# Patient Record
Sex: Female | Born: 1955 | Race: White | Hispanic: No | Marital: Single | Smoking: Never smoker
Health system: Southern US, Community
[De-identification: ages and names within clinical notes are randomized; demographics above are authoritative.]

## PROBLEM LIST (undated history)

## (undated) DIAGNOSIS — K5792 Diverticulitis of intestine, part unspecified, without perforation or abscess without bleeding: Secondary | ICD-10-CM

---

## 1999-08-17 ENCOUNTER — Encounter: Admission: RE | Admit: 1999-08-17 | Discharge: 1999-08-17 | Payer: Self-pay | Admitting: Gynecology

## 1999-08-17 ENCOUNTER — Encounter: Payer: Self-pay | Admitting: Gynecology

## 2000-08-29 ENCOUNTER — Encounter: Admission: RE | Admit: 2000-08-29 | Discharge: 2000-08-29 | Payer: Self-pay | Admitting: Gynecology

## 2000-08-29 ENCOUNTER — Other Ambulatory Visit: Admission: RE | Admit: 2000-08-29 | Discharge: 2000-08-29 | Payer: Self-pay | Admitting: Gynecology

## 2000-08-29 ENCOUNTER — Encounter: Payer: Self-pay | Admitting: Gynecology

## 2001-09-18 ENCOUNTER — Other Ambulatory Visit: Admission: RE | Admit: 2001-09-18 | Discharge: 2001-09-18 | Payer: Self-pay | Admitting: Gynecology

## 2001-09-18 ENCOUNTER — Encounter: Admission: RE | Admit: 2001-09-18 | Discharge: 2001-09-18 | Payer: Self-pay | Admitting: Gynecology

## 2001-09-18 ENCOUNTER — Encounter: Payer: Self-pay | Admitting: Gynecology

## 2002-11-14 ENCOUNTER — Encounter: Payer: Self-pay | Admitting: Gynecology

## 2002-11-14 ENCOUNTER — Encounter: Admission: RE | Admit: 2002-11-14 | Discharge: 2002-11-14 | Payer: Self-pay | Admitting: Gynecology

## 2002-11-14 ENCOUNTER — Other Ambulatory Visit: Admission: RE | Admit: 2002-11-14 | Discharge: 2002-11-14 | Payer: Self-pay | Admitting: Gynecology

## 2003-12-04 ENCOUNTER — Encounter: Admission: RE | Admit: 2003-12-04 | Discharge: 2003-12-04 | Payer: Self-pay | Admitting: Gynecology

## 2003-12-04 ENCOUNTER — Other Ambulatory Visit: Admission: RE | Admit: 2003-12-04 | Discharge: 2003-12-04 | Payer: Self-pay | Admitting: Gynecology

## 2004-12-23 ENCOUNTER — Ambulatory Visit (HOSPITAL_COMMUNITY): Admission: RE | Admit: 2004-12-23 | Discharge: 2004-12-23 | Payer: Self-pay | Admitting: Gynecology

## 2005-01-01 ENCOUNTER — Other Ambulatory Visit: Admission: RE | Admit: 2005-01-01 | Discharge: 2005-01-01 | Payer: Self-pay | Admitting: Gynecology

## 2006-01-12 ENCOUNTER — Ambulatory Visit (HOSPITAL_COMMUNITY): Admission: RE | Admit: 2006-01-12 | Discharge: 2006-01-12 | Payer: Self-pay | Admitting: Gynecology

## 2007-03-08 ENCOUNTER — Ambulatory Visit (HOSPITAL_COMMUNITY): Admission: RE | Admit: 2007-03-08 | Discharge: 2007-03-08 | Payer: Self-pay | Admitting: Obstetrics and Gynecology

## 2008-04-16 ENCOUNTER — Ambulatory Visit (HOSPITAL_COMMUNITY): Admission: RE | Admit: 2008-04-16 | Discharge: 2008-04-16 | Payer: Self-pay | Admitting: Obstetrics and Gynecology

## 2009-04-30 ENCOUNTER — Ambulatory Visit (HOSPITAL_COMMUNITY): Admission: RE | Admit: 2009-04-30 | Discharge: 2009-04-30 | Payer: Self-pay | Admitting: Obstetrics and Gynecology

## 2010-05-25 ENCOUNTER — Ambulatory Visit (HOSPITAL_COMMUNITY): Admission: RE | Admit: 2010-05-25 | Discharge: 2010-05-25 | Payer: Self-pay | Admitting: Obstetrics and Gynecology

## 2010-11-08 ENCOUNTER — Encounter: Payer: Self-pay | Admitting: Obstetrics & Gynecology

## 2010-11-08 ENCOUNTER — Encounter: Payer: Self-pay | Admitting: Obstetrics and Gynecology

## 2011-06-24 ENCOUNTER — Other Ambulatory Visit: Payer: Self-pay | Admitting: Obstetrics and Gynecology

## 2011-06-24 DIAGNOSIS — Z1231 Encounter for screening mammogram for malignant neoplasm of breast: Secondary | ICD-10-CM

## 2011-07-23 ENCOUNTER — Ambulatory Visit
Admission: RE | Admit: 2011-07-23 | Discharge: 2011-07-23 | Disposition: A | Discharge status: Home / Self Care | Payer: BC Managed Care – PPO | Source: Ambulatory Visit | Attending: Obstetrics and Gynecology | Admitting: Obstetrics and Gynecology

## 2011-07-23 DIAGNOSIS — Z1231 Encounter for screening mammogram for malignant neoplasm of breast: Secondary | ICD-10-CM

## 2012-08-31 ENCOUNTER — Other Ambulatory Visit: Payer: Self-pay | Admitting: Obstetrics and Gynecology

## 2012-08-31 DIAGNOSIS — Z1231 Encounter for screening mammogram for malignant neoplasm of breast: Secondary | ICD-10-CM

## 2012-10-12 ENCOUNTER — Ambulatory Visit
Admission: RE | Admit: 2012-10-12 | Discharge: 2012-10-12 | Disposition: A | Discharge status: Home / Self Care | Payer: No Typology Code available for payment source | Source: Ambulatory Visit | Attending: Obstetrics and Gynecology | Admitting: Obstetrics and Gynecology

## 2012-10-12 DIAGNOSIS — Z1231 Encounter for screening mammogram for malignant neoplasm of breast: Secondary | ICD-10-CM

## 2013-09-07 ENCOUNTER — Other Ambulatory Visit: Payer: Self-pay

## 2013-09-07 DIAGNOSIS — Z1231 Encounter for screening mammogram for malignant neoplasm of breast: Secondary | ICD-10-CM

## 2013-10-16 ENCOUNTER — Ambulatory Visit
Admission: RE | Admit: 2013-10-16 | Discharge: 2013-10-16 | Disposition: A | Discharge status: Home / Self Care | Payer: No Typology Code available for payment source | Source: Ambulatory Visit

## 2013-10-16 DIAGNOSIS — Z1231 Encounter for screening mammogram for malignant neoplasm of breast: Secondary | ICD-10-CM

## 2014-06-18 DEATH — deceased

## 2014-10-02 ENCOUNTER — Other Ambulatory Visit: Payer: Self-pay

## 2014-10-02 DIAGNOSIS — Z1231 Encounter for screening mammogram for malignant neoplasm of breast: Secondary | ICD-10-CM

## 2014-10-17 ENCOUNTER — Ambulatory Visit
Admission: RE | Admit: 2014-10-17 | Discharge: 2014-10-17 | Disposition: A | Discharge status: Home / Self Care | Payer: No Typology Code available for payment source | Source: Ambulatory Visit

## 2014-10-17 DIAGNOSIS — Z1231 Encounter for screening mammogram for malignant neoplasm of breast: Secondary | ICD-10-CM

## 2015-11-20 ENCOUNTER — Emergency Department (HOSPITAL_COMMUNITY)
Admission: EM | Admit: 2015-11-20 | Discharge: 2015-11-21 | Disposition: A | Discharge status: Other Acute Inpatient Hospital | Payer: BLUE CROSS/BLUE SHIELD | Attending: Emergency Medicine | Admitting: Emergency Medicine

## 2015-11-20 ENCOUNTER — Encounter (HOSPITAL_COMMUNITY): Payer: Self-pay | Admitting: Emergency Medicine

## 2015-11-20 DIAGNOSIS — Z79899 Other long term (current) drug therapy: Secondary | ICD-10-CM | POA: Insufficient documentation

## 2015-11-20 DIAGNOSIS — F309 Manic episode, unspecified: Secondary | ICD-10-CM

## 2015-11-20 DIAGNOSIS — F302 Manic episode, severe with psychotic symptoms: Secondary | ICD-10-CM | POA: Diagnosis not present

## 2015-11-20 DIAGNOSIS — R4182 Altered mental status, unspecified: Secondary | ICD-10-CM | POA: Diagnosis present

## 2015-11-20 DIAGNOSIS — R451 Restlessness and agitation: Secondary | ICD-10-CM | POA: Insufficient documentation

## 2015-11-20 LAB — CBC
HEMATOCRIT: 40.6 % (ref 36.0–46.0)
HEMOGLOBIN: 14.1 g/dL (ref 12.0–15.0)
MCH: 31.1 pg (ref 26.0–34.0)
MCHC: 34.7 g/dL (ref 30.0–36.0)
MCV: 89.6 fL (ref 78.0–100.0)
Platelets: 305 10*3/uL (ref 150–400)
RBC: 4.53 MIL/uL (ref 3.87–5.11)
RDW: 12.4 % (ref 11.5–15.5)
WBC: 10.5 10*3/uL (ref 4.0–10.5)

## 2015-11-20 LAB — RAPID URINE DRUG SCREEN, HOSP PERFORMED
AMPHETAMINES: NOT DETECTED
BARBITURATES: NOT DETECTED
Benzodiazepines: NOT DETECTED
Cocaine: NOT DETECTED
OPIATES: NOT DETECTED
TETRAHYDROCANNABINOL: NOT DETECTED

## 2015-11-20 LAB — URINALYSIS, ROUTINE W REFLEX MICROSCOPIC
BILIRUBIN URINE: NEGATIVE
GLUCOSE, UA: NEGATIVE mg/dL
Ketones, ur: NEGATIVE mg/dL
Leukocytes, UA: NEGATIVE
Nitrite: NEGATIVE
PH: 5.5 (ref 5.0–8.0)
Protein, ur: 30 mg/dL — AB
SPECIFIC GRAVITY, URINE: 1.015 (ref 1.005–1.030)

## 2015-11-20 LAB — COMPREHENSIVE METABOLIC PANEL
ALBUMIN: 4.6 g/dL (ref 3.5–5.0)
ALK PHOS: 76 U/L (ref 38–126)
ALT: 30 U/L (ref 14–54)
ANION GAP: 15 (ref 5–15)
AST: 44 U/L — ABNORMAL HIGH (ref 15–41)
BUN: 22 mg/dL — ABNORMAL HIGH (ref 6–20)
CHLORIDE: 98 mmol/L — AB (ref 101–111)
CO2: 19 mmol/L — AB (ref 22–32)
Calcium: 9.1 mg/dL (ref 8.9–10.3)
Creatinine, Ser: 0.85 mg/dL (ref 0.44–1.00)
GFR calc non Af Amer: >60 mL/min (ref >60)
GLUCOSE: 104 mg/dL — AB (ref 65–99)
POTASSIUM: 4 mmol/L (ref 3.5–5.1)
SODIUM: 132 mmol/L — AB (ref 135–145)
Total Bilirubin: 0.7 mg/dL (ref 0.3–1.2)
Total Protein: 7.4 g/dL (ref 6.5–8.1)

## 2015-11-20 LAB — URINE MICROSCOPIC-ADD ON

## 2015-11-20 LAB — ETHANOL: Alcohol, Ethyl (B): 159 mg/dL — ABNORMAL HIGH (ref <5)

## 2015-11-20 MED ORDER — ALUM & MAG HYDROXIDE-SIMETH 200-200-20 MG/5ML PO SUSP: 30.0000 mL | ORAL | Status: DC | PRN

## 2015-11-20 MED ORDER — IBUPROFEN 200 MG PO TABS
600.0000 mg | ORAL_TABLET | Freq: Three times a day (TID) | ORAL | Status: DC | PRN
  Filled 2015-11-20: qty 3

## 2015-11-20 MED ORDER — LORAZEPAM 1 MG PO TABS
1.0000 mg | ORAL_TABLET | Freq: Three times a day (TID) | ORAL | Status: DC | PRN
  Administered 2015-11-21 (×2): 1 mg via ORAL
  Filled 2015-11-20 (×2): qty 1

## 2015-11-20 MED ORDER — NICOTINE 21 MG/24HR TD PT24: 21.0000 mg | MEDICATED_PATCH | Freq: Every day | TRANSDERMAL | Status: DC

## 2015-11-20 MED ORDER — ACETAMINOPHEN 325 MG PO TABS
650.0000 mg | ORAL_TABLET | ORAL | Status: DC | PRN
  Administered 2015-11-21: 650 mg via ORAL
  Filled 2015-11-20: qty 2

## 2015-11-20 MED ORDER — ZOLPIDEM TARTRATE 5 MG PO TABS: 5.0000 mg | ORAL_TABLET | Freq: Every evening | ORAL | Status: DC | PRN

## 2015-11-20 MED ORDER — ONDANSETRON HCL 4 MG PO TABS: 4.0000 mg | ORAL_TABLET | Freq: Three times a day (TID) | ORAL | Status: DC | PRN

## 2015-11-20 NOTE — ED Notes (Signed)
ETOH was already added Tech sent extra blood and printout

## 2015-11-20 NOTE — ED Notes (Signed)
Patient is mentally altered. Patient is upset that trump is president. She says the world is not content. Patient states that she has had some wine.

## 2015-11-20 NOTE — ED Provider Notes (Signed)
CSN: [id number]     Arrival date & time 11/20/15  2009 History   First MD Initiated Contact with Patient 11/20/15 2021     Chief Complaint  Patient presents with  . Altered Mental Status     (Consider location/radiation/quality/duration/timing/severity/associated sxs/prior Treatment) HPI   Blood pressure 150/102, pulse 56, temperature 98.3 F (36.8 C), temperature source Oral, resp. rate 18, height 5\' 2"  (1.575 m), weight 56.7 kg, SpO2 99 %.  Michelle Levy is a 60 y.o. female with no significant past medical history accompanied by her daughter who prompted her to come to the emergency room for erratic behavior. As per daughter no psychiatric diagnoses however bipolar runs in the family. He is not taking any medications. Patient is becoming increasingly more agitated, labile, erratic behavior with no physical outbursts. Patient denies suicidal ideation, homicidal ideation, drug use. She states that she drinks wine occasionally. As per daughter she drinks daily, just a glass or 2 no history of seizures, hallucinations from not drinking alcohol. Level V caveat secondary to acute psychiatric condition. As per daughter, she is hearing voices.  History reviewed. No pertinent past medical history. History reviewed. No pertinent past surgical history. No family history on file. Social History  Substance Use Topics  . Smoking status: Never Smoker   . Smokeless tobacco: Never Used  . Alcohol Use: Yes   OB History    No data available     Review of Systems  10 systems reviewed and found to be negative, except as noted in the HPI.   Allergies  Review of patient's allergies indicates no known allergies.  Home Medications   Prior to Admission medications   Medication Sig Start Date End Date Taking? Authorizing Provider  Ascorbic Acid (VITAMIN C PO) Take 1 tablet by mouth daily.   Yes Historical Provider, MD  CALCIUM PO Take 1 tablet by mouth daily.   Yes Historical Provider, MD   Cholecalciferol (VITAMIN D PO) Take 1 tablet by mouth daily.   Yes Historical Provider, MD  Cyanocobalamin (VITAMIN B 12 PO) Take 1 tablet by mouth daily.   Yes Historical Provider, MD  MAGNESIUM PO Take 1 tablet by mouth daily.   Yes Historical Provider, MD  Multiple Vitamins-Minerals (MULTIVITAMIN & MINERAL PO) Take 1 tablet by mouth daily.   Yes Historical Provider, MD   BP 150/102 mmHg  Pulse 56  Temp(Src) 98.3 F (36.8 C) (Oral)  Resp 18  Ht 5\' 2"  (1.575 m)  Wt 56.7 kg  BMI 22.86 kg/m2  SpO2 99% Physical Exam  Constitutional: She is oriented to person, place, and time. She appears well-developed and well-nourished. No distress.  HENT:  Head: Normocephalic.  Eyes: Conjunctivae and EOM are normal.  Cardiovascular: Normal rate.   Pulmonary/Chest: Effort normal. No stridor.  Musculoskeletal: Normal range of motion.  Neurological: She is alert and oriented to person, place, and time.  Psychiatric: Her affect is labile. Her speech is rapid and/or pressured and tangential. She is agitated. Thought content is paranoid. She expresses no homicidal and no suicidal ideation.  Patient preseverating on recent gender of the people around her. She is redirectable and can be cooperative. Extremely agitated, manic. ++ Flight of ideas  Nursing note and vitals reviewed.   ED Course  Procedures (including critical care time) Labs Review Labs Reviewed  COMPREHENSIVE METABOLIC PANEL - Abnormal; Notable for the following:    Sodium 132 (*)    Chloride 98 (*)    CO2 19 (*)  Glucose, Bld 104 (*)    BUN 22 (*)    AST 44 (*)    All other components within normal limits  URINALYSIS, ROUTINE W REFLEX MICROSCOPIC (NOT AT [facility]) - Abnormal; Notable for the following:    Hgb urine dipstick SMALL (*)    Protein, ur 30 (*)    All other components within normal limits  ETHANOL - Abnormal; Notable for the following:    Alcohol, Ethyl (B) 159 (*)    All other components within normal limits  URINE  MICROSCOPIC-ADD ON - Abnormal; Notable for the following:    Squamous Epithelial / LPF 0-5 (*)    Bacteria, UA RARE (*)    All other components within normal limits  CBC  URINE RAPID DRUG SCREEN, HOSP PERFORMED    Imaging Review No results found. I have personally reviewed and evaluated these images and lab results as part of my medical decision-making.   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 female presenting acutely manic, flight of ideas. Disorganized thoughts. Patient is here voluntarily, she has no prior psychiatric diagnoses. Patient wants to leave she may need to be involuntarily committed.  Patient is medically cleared for psychiatric evaluation will be transferred to the psych ED. TTS consulted, home meds and psych standard holding orders placed.        Monico Blitz, PA-C 11/20/15 2249  Carmin Muskrat, MD 11/20/15 [phone]

## 2015-11-21 ENCOUNTER — Inpatient Hospital Stay (HOSPITAL_COMMUNITY)
Admission: EM | Admit: 2015-11-21 | Discharge: 2015-11-27 | DRG: 885 | Disposition: A | Discharge status: Home / Self Care | Payer: BLUE CROSS/BLUE SHIELD | Source: Intra-hospital | Attending: Psychiatry | Admitting: Psychiatry

## 2015-11-21 ENCOUNTER — Encounter (HOSPITAL_COMMUNITY): Payer: Self-pay | Admitting: *Deleted

## 2015-11-21 DIAGNOSIS — F319 Bipolar disorder, unspecified: Secondary | ICD-10-CM | POA: Diagnosis not present

## 2015-11-21 DIAGNOSIS — R462 Strange and inexplicable behavior: Secondary | ICD-10-CM

## 2015-11-21 DIAGNOSIS — G4700 Insomnia, unspecified: Secondary | ICD-10-CM | POA: Diagnosis present

## 2015-11-21 DIAGNOSIS — F3163 Bipolar disorder, current episode mixed, severe, without psychotic features: Secondary | ICD-10-CM | POA: Clinically undetermined

## 2015-11-21 DIAGNOSIS — F29 Unspecified psychosis not due to a substance or known physiological condition: Secondary | ICD-10-CM | POA: Diagnosis present

## 2015-11-21 DIAGNOSIS — F1020 Alcohol dependence, uncomplicated: Secondary | ICD-10-CM | POA: Diagnosis present

## 2015-11-21 DIAGNOSIS — F302 Manic episode, severe with psychotic symptoms: Secondary | ICD-10-CM | POA: Diagnosis not present

## 2015-11-21 DIAGNOSIS — F419 Anxiety disorder, unspecified: Secondary | ICD-10-CM | POA: Diagnosis present

## 2015-11-21 DIAGNOSIS — Z634 Disappearance and death of family member: Secondary | ICD-10-CM | POA: Diagnosis not present

## 2015-11-21 MED ORDER — HYDROXYZINE HCL 25 MG PO TABS
25.0000 mg | ORAL_TABLET | Freq: Four times a day (QID) | ORAL | Status: AC | PRN
  Administered 2015-11-22: 25 mg via ORAL
  Filled 2015-11-21: qty 1

## 2015-11-21 MED ORDER — CHLORDIAZEPOXIDE HCL 25 MG PO CAPS: 25.0000 mg | ORAL_CAPSULE | Freq: Four times a day (QID) | ORAL | Status: AC | PRN

## 2015-11-21 MED ORDER — MAGNESIUM HYDROXIDE 400 MG/5ML PO SUSP: 30.0000 mL | Freq: Every day | ORAL | Status: DC | PRN

## 2015-11-21 MED ORDER — QUETIAPINE FUMARATE 25 MG PO TABS
25.0000 mg | ORAL_TABLET | Freq: Every day | ORAL | Status: DC
  Administered 2015-11-22: 25 mg via ORAL
  Filled 2015-11-21 (×3): qty 1

## 2015-11-21 MED ORDER — ACETAMINOPHEN 325 MG PO TABS: 650.0000 mg | ORAL_TABLET | Freq: Four times a day (QID) | ORAL | Status: DC | PRN

## 2015-11-21 MED ORDER — ONDANSETRON 4 MG PO TBDP: 4.0000 mg | ORAL_TABLET | Freq: Four times a day (QID) | ORAL | Status: AC | PRN

## 2015-11-21 MED ORDER — ALUM & MAG HYDROXIDE-SIMETH 200-200-20 MG/5ML PO SUSP: 30.0000 mL | ORAL | Status: DC | PRN

## 2015-11-21 MED ORDER — LOPERAMIDE HCL 2 MG PO CAPS: 2.0000 mg | ORAL_CAPSULE | ORAL | Status: AC | PRN

## 2015-11-21 MED ORDER — ADULT MULTIVITAMIN W/MINERALS CH
1.0000 | ORAL_TABLET | Freq: Every day | ORAL | Status: DC
  Administered 2015-11-21 – 2015-11-27 (×6): 1 via ORAL
  Filled 2015-11-21 (×11): qty 1

## 2015-11-21 MED ORDER — THIAMINE HCL 100 MG/ML IJ SOLN
100.0000 mg | Freq: Once | INTRAMUSCULAR | Status: AC
  Administered 2015-11-21: 100 mg via INTRAMUSCULAR
  Filled 2015-11-21: qty 2

## 2015-11-21 MED ORDER — VITAMIN B-1 100 MG PO TABS
100.0000 mg | ORAL_TABLET | Freq: Every day | ORAL | Status: DC
  Administered 2015-11-23 – 2015-11-27 (×5): 100 mg via ORAL
  Filled 2015-11-21 (×8): qty 1

## 2015-11-21 NOTE — BH Assessment (Addendum)
Tele Assessment Note   Michelle Levy is a Caucasian, divorced 60 y.o. female presenting to [facility] due to worsening psychotic behaviors and mania, including rambling with flight of ideas, hallucinations, and bizarre, erratic behavior. Pt is reportedly exhibiting increased agitation, preoccupation with political matters, and paranoia (that others are agitated with her). Pt's daughter, [name], prompted her to come to the ED for evaluation. Pt's daughter says that her mother has been "off" since yesterday, with disorganized thoughts, not remembering bizarre things she's said and done, rambling on about President Trump, unintentionally running red lights, and even walking up to a stranger in a restaurant and asking him to order for her. Today, the pt went to ger grandson's school and began running, yelling, and throwing her hands in the air attempting to play with the children and make them laugh. The pt said she was just feeling "joyful" and does not see this as inappropriate behavior, adding that her family doesn't want her to be "joyful". The pt acknowledged that she had been sitting on her porch earlier in the day screaming and crying "to try and get my emotions out". She also informed her daughter that she was hearing voices in the form of someone talking to her. She reports that she has had 2 family deaths in the month of Jan (2017) alone, and she becomes very tearful while speaking about this; the pt believes that these deaths are contributing to [facility] her family calls] her "concerning behaviors". Family reports that pt exhibits these erratic behaviors even when not drinking or intoxicated, however.  The pt admits that she has been drinking wine today, estimated to be about "3 glasses of wine"; she minimized her etoh use but the daughter states that the pt has been abusing etoh for many years. The daughter adds that her mother used to drink 2-3 bottles of wine per night but has decreased to 1/2 to 1 bottle per  evening. Pt's BAL upon arrival to ED is 159. No other illicit drug use. Pt reports restless sleep, irritability, sadness, tearfulness, fatigue, excessive worry, and agitation. There appears to be some paranoia and personalization, as the pt c/o "other people being agitated with me", whether it's people ranting on face book, people whispering in public, strangers cutting her off in traffic, etc. She says she sleeps "with one eye open".  Pt states that she is not under the care of a mental health professional and takes no psychiatric medications. She states that she used to see a counselor with her ex-husband many years ago for marriage counseling though. She denies any hx of psychiatric inpt hospitalization but says that her mother had a "breakdown" after the birth of a child and was in a psychiatric facility for 3 months. Pt denies SI/HI but admits to suicidal thoughts in 2008 during the economic recession because she and her husband lost a lot of money. She denies any prior suicide attempts.  Pt presents with depressed, labile mood and congruent affect. She is irritable at times and pleasant, crying, and suspicious at other times. She is hyper verbal and speech is rapid. She gets agitated just speaking about certain topics, like politics. She is rather defensive during the assessment, asking the counselor "Do you think I'm of sound mind?" or "Aren't you bothered by what's going on in this country?". She exhibits poor concentration, good eye contact, and appears restless. Thought process is tangential with flight of ideas. Pt does not appear to be responding to internal stimuli at present.  She is experiencing psychosis, but she has no hx of violent behavior whatsoever (as verified by family). Pt is oriented to person and place only. Judgment is impaired and insight is very poor.   Disposition: Per Arlester Marker, NP, Pt meets inpt tx criteria. Per Felix Pacini, Pt accepted to [facility] bed 505-2 under the care of Dr  Shea Evans. Pt to come after 08:00 AM on 11/21/15.  Diagnosis:  Bipolar I Disorder, Manic, With psychotic features;  R/O Schizoaffective, Bipolar Type Alcohol use disorder, Moderate  Past Medical History: History reviewed. No pertinent past medical history.  History reviewed. No pertinent past surgical history.  Family History: No family history on file.  Social History:  reports that she has never smoked. She has never used smokeless tobacco. She reports that she drinks alcohol. She reports that she does not use illicit drugs.  Additional Social History:  Alcohol / Drug Use Pain Medications: See PTA med list Prescriptions: See PTA med list Over the Counter: See PTA med list History of alcohol / drug use?: Yes Longest period of sobriety (when/how long): Unknown Negative Consequences of Use: Personal relationships Withdrawal Symptoms:  (Pt denies) Substance #1 Name of Substance 1: Etoh (Wine) 1 - Age of First Use: 20's 1 - Amount (size/oz): 2-3 bottles of wine in past; has cut down to 1 bottle 1 - Frequency: Almost daily 1 - Duration: 10+ years 1 - Last Use / Amount: 11/20/15, "3 glasses of wine"  CIWA: CIWA-Ar BP: 129/73 mmHg Pulse Rate: 95 Nausea and Vomiting: no nausea and no vomiting Tactile Disturbances: none Tremor: no tremor Auditory Disturbances: not present Paroxysmal Sweats: no sweat visible Visual Disturbances: not present Anxiety: no anxiety, at ease Headache, Fullness in Head: none present Agitation: normal activity Orientation and Clouding of Sensorium: oriented and can do serial additions CIWA-Ar Total: 0 COWS:    PATIENT STRENGTHS: (choose at least two) Average or above average intelligence Physical Health Supportive family/friends Work skills  Allergies: No Known Allergies  Home Medications:  (Not in a hospital admission)  OB/GYN Status:  No LMP recorded. Patient is postmenopausal.  General Assessment Data Location of Assessment: [facility] ED TTS  Assessment: In system Is this a Tele or Face-to-Face Assessment?: Tele Assessment Is this an Initial Assessment or a Re-assessment for this encounter?: Initial Assessment Marital status: Divorced [place] name: unknown Is patient pregnant?: No Pregnancy Status: No Living Arrangements: Alone Can pt return to current living arrangement?: Yes Admission Status: Voluntary Is patient capable of signing voluntary admission?: Yes Referral Source: Self/Family/Friend (Pt's daughter, [name]) Insurance type: [employer]     Crisis Care Plan Living Arrangements: Alone Legal Guardian:  (Daughter thinks she may be POA (no documentation of this)) Name of Psychiatrist: None Name of Therapist: None  Education Status Is patient currently in school?: No Current Grade: na Highest grade of school patient has completed: na Name of school: na Contact person: na  Risk to self with the past 6 months Suicidal Ideation: No Has patient been a risk to self within the past 6 months prior to admission? : No Suicidal Intent: No Has patient had any suicidal intent within the past 6 months prior to admission? : No Is patient at risk for suicide?: No Suicidal Plan?: No Has patient had any suicidal plan within the past 6 months prior to admission? : No Access to Means: No What has been your use of drugs/alcohol within the last 12 months?: Etoh use multiple times per week Previous Attempts/Gestures: No How many times?: 0  Other Self Harm Risks: Etoh abuse Triggers for Past Attempts: None known Intentional Self Injurious Behavior: None Family Suicide History: No Recent stressful life event(s): Conflict (Comment), Loss (Comment), Financial Problems (Family conflict, 2 family deaths in Jan, political stressors) Persecutory voices/beliefs?: Yes Depression: Yes Depression Symptoms: Tearfulness, Isolating, Feeling angry/irritable (restless sleep, personalization) Substance abuse history and/or treatment for substance  abuse?: Yes Suicide prevention information given to non-admitted patients: Not applicable  Risk to Others within the past 6 months Homicidal Ideation: No Does patient have any lifetime risk of violence toward others beyond the six months prior to admission? : No Thoughts of Harm to Others: No Current Homicidal Intent: No Current Homicidal Plan: No Access to Homicidal Means: No Identified Victim: n/a History of harm to others?: No Assessment of Violence: None Noted Violent Behavior Description: No hx of violence; just verbal aggression/screaming/yelling Does patient have access to weapons?: No Criminal Charges Pending?: No Does patient have a court date: No Is patient on probation?: No  Psychosis Hallucinations: Auditory Delusions: Unspecified (Paranoid ideations ("ppl agitated with me, talk about me"))  Mental Status Report Appearance/Hygiene: In scrubs Eye Contact: Good Motor Activity: Restlessness Speech: Rapid Level of Consciousness: Irritable Mood: Depressed, Suspicious Affect: Labile Anxiety Level: Moderate Thought Processes: Tangential, Flight of Ideas Judgement: Impaired Orientation: Person, Place Obsessive Compulsive Thoughts/Behaviors: Moderate  Cognitive Functioning Concentration: Decreased Memory: Recent Impaired IQ: Average Insight: Poor Impulse Control: Poor Appetite: Good Weight Loss: 0 Weight Gain: 0 Sleep: No Change Total Hours of Sleep: 7 ("restless sleep") Vegetative Symptoms: None  ADLScreening [facility] Assessment Services) Patient's cognitive ability adequate to safely complete daily activities?: Yes Patient able to express need for assistance with ADLs?: Yes Independently performs ADLs?: Yes (appropriate for developmental age)  Prior Inpatient Therapy Prior Inpatient Therapy: No Prior Therapy Dates: na Prior Therapy Facilty/Provider(s): na Reason for Treatment: na  Prior Outpatient Therapy Prior Outpatient Therapy: Yes Prior Therapy  Dates: "a few years ago" with my ex-husband Prior Therapy Facilty/Provider(s): Unknown Reason for Treatment: Marital counseling Does patient have an ACCT team?: No Does patient have Intensive In-House Services?  : No Does patient have [facility] services? : No Does patient have P4CC services?: No  ADL Screening (condition at time of admission) Patient's cognitive ability adequate to safely complete daily activities?: Yes Is the patient deaf or have difficulty hearing?: No Does the patient have difficulty seeing, even when wearing glasses/contacts?: No Does the patient have difficulty concentrating, remembering, or making decisions?: Yes Patient able to express need for assistance with ADLs?: Yes Does the patient have difficulty dressing or bathing?: No Independently performs ADLs?: Yes (appropriate for developmental age) Does the patient have difficulty walking or climbing stairs?: No Weakness of Legs: None Weakness of Arms/Hands: None  Home Assistive Devices/Equipment Home Assistive Devices/Equipment: None    Abuse/Neglect Assessment (Assessment to be complete while patient is alone) Physical Abuse: Denies Verbal Abuse: Denies Sexual Abuse: Denies Exploitation of patient/patient's resources: Denies Self-Neglect: Denies Values / Beliefs Cultural Requests During Hospitalization: None Spiritual Requests During Hospitalization: None   Advance Directives (For Healthcare) Does patient have an advance directive?: No Would patient like information on creating an advanced directive?: No - patient declined information    Additional Information 1:1 In Past 12 Months?: No CIRT Risk: No Elopement Risk: No Does patient have medical clearance?: Yes     Disposition: Per Arlester Marker, NP, Pt meets inpt tx criteria. Per Felix Pacini, Pt accepted to [facility] bed 505-2 under the care of Dr Shea Evans. Pt to come after  08:00 AM on 11/21/15. Disposition Initial Assessment Completed for this  Encounter: Yes Disposition of Patient: Inpatient treatment program Type of inpatient treatment program: Adult  Ramond Dial, [facility]  11/21/2015 1:34 AM

## 2015-11-21 NOTE — ED Notes (Signed)
Error in charting: Wrong emtala entry at 0912. Wrong pt for 0912 note.

## 2015-11-21 NOTE — ED Notes (Signed)
This is a 60 years old caucasian female admitted to the unit for psychosis and altered mental status. She appeared manic, hyper verbal,had disorganized thought process and tangential. She reported that her ex- alcoholic husband gave her 2 boxes of wine for christmas and she has been drinking since then. She said she had 3 glasses of wine today. She said she is overwhelmed by what is going on in the country, " why can't everybody love everybody? Why can't we all be free to make decisions for ourselves. She reported that she was in [place] last month for the women's match. Her thoughts process is disorganized and is labile. She often stopped and cry during the assessment. Writer offered patient medication. She refused ,stating, "that is a way you control [place], I won't allow anyone to control me" . Writer offered patient beverage and encouraged her to get some rest.

## 2015-11-21 NOTE — ED Provider Notes (Signed)
Patient placed at [facility] and accepted by Dr. Shea Evans Patient evaluated. Stable vital signs and unremarkable physical exam.  Medically stable for transfer to [facility] for ongoing pysch treatment.  Forde Dandy, MD 11/21/15 [phone]

## 2015-11-21 NOTE — Progress Notes (Signed)
Adult Psychoeducational Group Note  Date:  11/21/2015 Time:  8:55 PM  Group Topic/Focus:  Wrap-Up Group:   The focus of this group is to help patients review their daily goal of treatment and discuss progress on daily workbooks.  Participation Level:  Active  Participation Quality:  Appropriate  Affect:  Appropriate  Cognitive:  Appropriate  Insight: Appropriate  Engagement in Group:  Engaged  Modes of Intervention:  Discussion  Additional Comments:  The patient expressed that she attended group.The patient also said that he rates his day a 4.  Nash Shearer 11/21/2015, 8:55 PM

## 2015-11-21 NOTE — Progress Notes (Signed)
D: Pt is alert and oriented x 4; denies depression, anxiety, pain, SI, HI and AVH; she states, "I was a little confused this evening but I feel very good right now." Pt is isolative and withdrawn to room. Pt remained respectful, calm and cooperative through the shift assessment.  A: Medications offered as prescribed.  Support, encouragement, and safe environment provided.  15-minute safety checks continue.  R: Pt was med compliant.  Pt attended group. Safety checks continue

## 2015-11-21 NOTE — Progress Notes (Signed)
Patient remains confused and delusional. She was in bed actively hallucination, thought process disorganized and tangential. She complaint of having headache  And she seemed restless. Tylenol and Ativan given. Patient received those medication at this time. Q 15 minutes check continues for safety.

## 2015-11-21 NOTE — H&P (Signed)
Psychiatric Admission Assessment Adult  Patient Identification: Michelle Levy MRN:  [id number] Date of Evaluation:  11/21/2015 Chief Complaint:  Bipolar Disorder Principal Diagnosis: Bizarre behavior Diagnosis:   Patient Active Problem List   Diagnosis Date Noted  . Bizarre behavior [R46.2] 11/21/2015    Priority: High  . Psychosis [F29] 11/21/2015   History of Present Illness:   Michelle Levy, 60 y.o. female initially presented to [facility] accompanied by her family after she exhibited worsening psychotic behaviors and mania, including rambling with flight of ideas, hallucinations, and bizarre, erratic behavior.  Per chart records, the patient was agitated at home and was preoccupied with president Trump and going to grandson's school throwing her hands in the air.  Per her daughter, patient had stated that she had been hearing voices.  When she was seen today, she was pleasant.  She was tangential.  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  Per nursing, it was included in the report that patient liked to drink wine,  This was confirmed by the patient.  Family reports that pt exhibits these erratic behaviors even when not drinking or intoxicated, however.    Patient per daughter has been minimizing the amount of alcohol she consumed on a regular basis.  Patient has not been seen by any mental health professional or been on any psychotropic meds. Per chart records, She denies any hx of psychiatric inpt hospitalization but says that her mother had a "breakdown" after the birth of a child and was in a psychiatric facility for 3 months. Pt denies SI/HI but admits to suicidal thoughts in 2008 during the economic recession because she and her husband lost a lot of money. She denies any prior suicide attempts.  Associated Signs/Symptoms: Depression Symptoms:  anxiety, (Hypo) Manic Symptoms:  Distractibility, Flight of Ideas, Labiality of Mood, Anxiety  Symptoms:  NA Psychotic Symptoms:  Hallucinations: Auditory on admission to [facility] PTSD Symptoms: NA Total Time spent with patient: 45 minutes  Past Psychiatric History: none, denies  Risk to Self: Is patient at risk for suicide?: Yes Risk to Others:   Prior Inpatient Therapy:   Prior Outpatient Therapy:    Alcohol Screening: 1. How often do you have a drink containing alcohol?: 4 or more times a week 2. How many drinks containing alcohol do you have on a typical day when you are drinking?: 7, 8, or 9 3. How often do you have six or more drinks on one occasion?: Less than monthly Preliminary Score: 4 4. How often during the last year have you found that you were not able to stop drinking once you had started?: Less than monthly 5. How often during the last year have you failed to do what was normally expected from you becasue of drinking?: Less than monthly 6. How often during the last year have you needed a first drink in the morning to get yourself going after a heavy drinking session?: Never 7. How often during the last year have you had a feeling of guilt of remorse after drinking?: Less than monthly 8. How often during the last year have you been unable to remember what happened the night before because you had been drinking?: Never 9. Have you or someone else been injured as a result of your drinking?: No 10. Has a relative or friend or a doctor or another health worker been concerned about your drinking or suggested you cut down?: Yes, but not in the  last year Alcohol Use Disorder Identification Test Final Score (AUDIT): 13 Brief Intervention: Patient declined brief intervention Substance Abuse History in the last 12 months:  Yes.   Consequences of Substance Abuse: NA Previous Psychotropic Medications: No  Psychological Evaluations: Yes  Past Medical History: History reviewed. No pertinent past medical history. History reviewed. No pertinent past surgical history. Family History:  History reviewed. No pertinent family history. Family Psychiatric  History:   Tobacco Screening: non smoker Social History:  History  Alcohol Use  . 2.4 - 3.0 oz/week  . 4-5 Glasses of wine, 0 Cans of beer, 0 Shots of liquor per week     History  Drug Use No    Additional Social History:  Allergies:  No Known Allergies Lab Results:  Results for orders placed or performed during the hospital encounter of 11/20/15 (from the past 48 hour(s))  Urinalysis, Routine w reflex microscopic (not at [facility])     Status: Abnormal   Collection Time: 11/20/15  8:59 PM  Result Value Ref Range   Color, Urine YELLOW YELLOW   APPearance CLEAR CLEAR   Specific Gravity, Urine 1.015 1.005 - 1.030   pH 5.5 5.0 - 8.0   Glucose, UA NEGATIVE NEGATIVE mg/dL   Hgb urine dipstick SMALL (A) NEGATIVE   Bilirubin Urine NEGATIVE NEGATIVE   Ketones, ur NEGATIVE NEGATIVE mg/dL   Protein, ur 30 (A) NEGATIVE mg/dL   Nitrite NEGATIVE NEGATIVE   Leukocytes, UA NEGATIVE NEGATIVE  Urine rapid drug screen (hosp performed)     Status: None   Collection Time: 11/20/15  8:59 PM  Result Value Ref Range   Opiates NONE DETECTED NONE DETECTED   Cocaine NONE DETECTED NONE DETECTED   Benzodiazepines NONE DETECTED NONE DETECTED   Amphetamines NONE DETECTED NONE DETECTED   Tetrahydrocannabinol NONE DETECTED NONE DETECTED   Barbiturates NONE DETECTED NONE DETECTED    Comment:        DRUG SCREEN FOR MEDICAL PURPOSES ONLY.  IF CONFIRMATION IS NEEDED FOR ANY PURPOSE, NOTIFY LAB WITHIN 5 DAYS.        LOWEST DETECTABLE LIMITS FOR URINE DRUG SCREEN Drug Class       Cutoff (ng/mL) Amphetamine      1000 Barbiturate      200 Benzodiazepine   650 Tricyclics       354 Opiates          300 Cocaine          300 THC              50   Urine microscopic-add on     Status: Abnormal   Collection Time: 11/20/15  8:59 PM  Result Value Ref Range   Squamous Epithelial / LPF 0-5 (A) NONE SEEN   WBC, UA 0-5 0 - 5 WBC/hpf   RBC / HPF  0-5 0 - 5 RBC/hpf   Bacteria, UA RARE (A) NONE SEEN  Comprehensive metabolic panel     Status: Abnormal   Collection Time: 11/20/15  9:23 PM  Result Value Ref Range   Sodium 132 (L) 135 - 145 mmol/L   Potassium 4.0 3.5 - 5.1 mmol/L   Chloride 98 (L) 101 - 111 mmol/L   CO2 19 (L) 22 - 32 mmol/L   Glucose, Bld 104 (H) 65 - 99 mg/dL   BUN 22 (H) 6 - 20 mg/dL   Creatinine, Ser 0.85 0.44 - 1.00 mg/dL   Calcium 9.1 8.9 - 10.3 mg/dL   Total Protein 7.4 6.5 - 8.1  g/dL   Albumin 4.6 3.5 - 5.0 g/dL   AST 44 (H) 15 - 41 U/L   ALT 30 14 - 54 U/L   Alkaline Phosphatase 76 38 - 126 U/L   Total Bilirubin 0.7 0.3 - 1.2 mg/dL   GFR calc non Af Amer >60 >60 mL/min   GFR calc Af Amer >60 >60 mL/min    Comment: (NOTE) The eGFR has been calculated using the CKD EPI equation. This calculation has not been validated in all clinical situations. eGFR's persistently <60 mL/min signify possible Chronic Kidney Disease.    Anion gap 15 5 - 15  CBC     Status: None   Collection Time: 11/20/15  9:23 PM  Result Value Ref Range   WBC 10.5 4.0 - 10.5 K/uL   RBC 4.53 3.87 - 5.11 MIL/uL   Hemoglobin 14.1 12.0 - 15.0 g/dL   HCT 40.6 36.0 - 46.0 %   MCV 89.6 78.0 - 100.0 fL   MCH 31.1 26.0 - 34.0 pg   MCHC 34.7 30.0 - 36.0 g/dL   RDW 12.4 11.5 - 15.5 %   Platelets 305 150 - 400 K/uL  Ethanol     Status: Abnormal   Collection Time: 11/20/15  9:24 PM  Result Value Ref Range   Alcohol, Ethyl (B) 159 (H) <5 mg/dL    Comment:        LOWEST DETECTABLE LIMIT FOR SERUM ALCOHOL IS 5 mg/dL FOR MEDICAL PURPOSES ONLY     Metabolic Disorder Labs:  No results found for: HGBA1C, MPG No results found for: PROLACTIN No results found for: CHOL, TRIG, HDL, CHOLHDL, VLDL, LDLCALC  Current Medications: Current Facility-Administered Medications  Medication Dose Route Frequency Provider Last Rate Last Dose  . acetaminophen (TYLENOL) tablet 650 mg  650 mg Oral Q6H PRN Benjamine Mola, FNP      . alum & mag  hydroxide-simeth (MAALOX/MYLANTA) 200-200-20 MG/5ML suspension 30 mL  30 mL Oral Q4H PRN Benjamine Mola, FNP      . chlordiazePOXIDE (LIBRIUM) capsule 25 mg  25 mg Oral Q6H PRN Kerrie Buffalo, NP      . hydrOXYzine (ATARAX/VISTARIL) tablet 25 mg  25 mg Oral Q6H PRN Kerrie Buffalo, NP      . loperamide (IMODIUM) capsule 2-4 mg  2-4 mg Oral PRN Kerrie Buffalo, NP      . magnesium hydroxide (MILK OF MAGNESIA) suspension 30 mL  30 mL Oral Daily PRN Benjamine Mola, FNP      . multivitamin with minerals tablet 1 tablet  1 tablet Oral Daily Kerrie Buffalo, NP      . ondansetron (ZOFRAN-ODT) disintegrating tablet 4 mg  4 mg Oral Q6H PRN Kerrie Buffalo, NP      . QUEtiapine (SEROQUEL) tablet 25 mg  25 mg Oral QHS Kerrie Buffalo, NP      . thiamine (B-1) injection 100 mg  100 mg Intramuscular Once Kerrie Buffalo, NP      . Derrill Memo ON 11/22/2015] thiamine (VITAMIN B-1) tablet 100 mg  100 mg Oral Daily Kerrie Buffalo, NP       PTA Medications: Prescriptions prior to admission  Medication Sig Dispense Refill Last Dose  . Ascorbic Acid (VITAMIN C PO) Take 1 tablet by mouth daily.   11/20/2015 at Unknown time  . CALCIUM PO Take 1 tablet by mouth daily.   11/20/2015 at Unknown time  . Cholecalciferol (VITAMIN D PO) Take 1 tablet by mouth daily.   11/20/2015 at Unknown time  .  Cyanocobalamin (VITAMIN B 12 PO) Take 1 tablet by mouth daily.   11/20/2015 at Unknown time  . MAGNESIUM PO Take 1 tablet by mouth daily.   11/20/2015 at Unknown time  . Multiple Vitamins-Minerals (MULTIVITAMIN & MINERAL PO) Take 1 tablet by mouth daily.   11/20/2015 at Unknown time    Musculoskeletal: Strength & Muscle Tone: within normal limits Gait & Station: normal Patient leans: Right  Psychiatric Specialty Exam: Physical Exam  Vitals reviewed.   Review of Systems  All other systems reviewed and are negative.   Blood pressure 132/87, pulse 100, temperature 98.2 F (36.8 C), temperature source Oral, resp. rate 16, height _0  (1.575  m), weight 62.596 kg (138 lb), SpO2 100 %.Body mass index is 25.23 kg/(m^2).  General Appearance: Disheveled  Eye Contact::  Good  Speech:  Normal Rate  Volume:  Normal  Mood:  Euthymic  Affect:  Full Range  Thought Process:  Disorganized and Tangential  Orientation:  Full (Time, Place, and Person)  Thought Content:  Rumination  Suicidal Thoughts:  No  Homicidal Thoughts:  No  Memory:  Immediate;   Poor Recent;   Poor Remote;   Poor  Judgement:  Impaired  Insight:  Lacking  Psychomotor Activity:  Normal  Concentration:  Poor  Recall:  Poor  Fund of Knowledge:Poor  Language: Poor  Akathisia:  Negative  Handed:  Right  AIMS (if indicated):     Assets:  Physical Health Resilience Social Support  ADL's:  Intact  Cognition: WNL  Sleep:      Treatment Plan Summary: Admit for crisis management and mood stabilization. Medication management to re-stabilize current mood symptoms Group counseling sessions for coping skills Medical consults as needed Review and reinstate any pertinent home medications for other health problems  Librium protocol withdrawal alcohol Seroquel 25 mg QHS for psychosis Lipid panel, EKG, PL pending  Observation Level/Precautions:  15 minute checks  Laboratory:  per ED  Psychotherapy:  group  Medications:  As per medlist  Consultations:  As needed  Discharge Concerns:  safety  Estimated LOS:  safety  Other:     I certify that inpatient services furnished can reasonably be expected to improve the patient's condition.    Janett Labella, NP AGNP-BC 2/3/20175:18 PM  Patient seen face to face for psychiatric evaluation. Chart reviewed and finding discussed with Physician extender. Agreed with disposition and treatment plan.   Berniece Andreas, MD

## 2015-11-21 NOTE — BH Assessment (Signed)
[place] Assessment Progress Note  Per Arlester Marker, NP, this pt requires psychiatric hospitalization at this time.  Irine Seal, RN, [facility] has assigned pt to [facility] Rm 505-2.  Pt has refused to sign either Voluntary Admission and Consent for Treatment or Consent to Release Information.  This has been staffed with Corena Pilgrim, MD, who has initiated IVC.  At 11:35 Bolan acknowledged receipt of IVC documents.  As of this writing, service of Findings and Custody Order is pending.  Pt's nurse, Nicoletta Dress, has been notified, and agrees to call report to [phone].  Pt is to be transported via Event organiser.  Jalene Mullet, [place] Triage Specialist [phone]

## 2015-11-21 NOTE — BHH Counselor (Signed)
Psych Disposition: Per Arlester Marker, NP, Pt meets inpatient treatment criteria. Per Felix Pacini, Pt accepted to [facility] bed 505-2 under the care of Dr Shea Evans. Pt to come after 8:00 AM on 11/21/15.  Counselor informed RN in [place] of disposition and bed assignment. Could not reach EDP to make aware of disposition. Will try again later.   Ramond Dial, [facility]   Therapeutic Triage    Ext [phone]

## 2015-11-21 NOTE — Tx Team (Signed)
Initial Interdisciplinary Treatment Plan   PATIENT STRESSORS: Educational concerns Financial difficulties Health problems Traumatic event   PATIENT STRENGTHS: Ability for insight Active sense of humor Average or above average intelligence Capable of independent living Communication skills Financial means   PROBLEM LIST: Problem List/Patient Goals Date to be addressed Date deferred Reason deferred Estimated date of resolution  Psychosis 11/21/2015     Alcoholism 11/21/2015                             " I am loving" 11/21/2015     " I am confident" 11/21/2015            DISCHARGE CRITERIA:  Ability to meet basic life and health needs Adequate post-discharge living arrangements Improved stabilization in mood, thinking, and/or behavior Medical problems require only outpatient monitoring Motivation to continue treatment in a less acute level of care  PRELIMINARY DISCHARGE PLAN: Attend aftercare/continuing care group Attend PHP/IOP Attend 12-step recovery group Outpatient therapy Participate in family therapy  PATIENT/FAMIILY INVOLVEMENT: This treatment plan has been presented to and reviewed with the patient, Michelle Levy, and/or family member, .  The patient and family have been given the opportunity to ask questions and make suggestions.  Lauralyn Primes 11/21/2015, 4:12 PM

## 2015-11-21 NOTE — Progress Notes (Signed)
Michelle Levy is a 60 yo feamle who is admitted to [facility] from [facility], after her adult son and daughter (aaccording to her) took her to the Ed due to bizarre behaviors. When she found out she was being IVC'd to come here...she became very upset. At the time of admission, she deneis active SI. She says " I only have a little drinking problem". 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 She is quite guarded. She questions EVERYTHING this writer tellls her  . After admission completed, she is escorted to her room.

## 2015-11-21 NOTE — ED Notes (Signed)
Family member took belongings home

## 2015-11-22 DIAGNOSIS — F29 Unspecified psychosis not due to a substance or known physiological condition: Secondary | ICD-10-CM

## 2015-11-22 DIAGNOSIS — R462 Strange and inexplicable behavior: Secondary | ICD-10-CM

## 2015-11-22 LAB — TSH: TSH: 1.295 u[IU]/mL (ref 0.350–4.500)

## 2015-11-22 LAB — LIPID PANEL
CHOL/HDL RATIO: 2.1 ratio
CHOLESTEROL: 229 mg/dL — AB (ref 0–200)
HDL: 109 mg/dL (ref >40)
LDL Cholesterol: 101 mg/dL — ABNORMAL HIGH (ref 0–99)
TRIGLYCERIDES: 95 mg/dL (ref <150)
VLDL: 19 mg/dL (ref 0–40)

## 2015-11-22 LAB — T4, FREE: Free T4: 1.4 ng/dL — ABNORMAL HIGH (ref 0.61–1.12)

## 2015-11-22 NOTE — Progress Notes (Signed)
Michelle Levy has had a good day today...she has settled into the hospital routine nicely. She spent the first half of the day in her room..  then after lunch, she began to come out of her room and interact with  her peers.  A She  refused taking any medications and says " I'm feeling fine..  I  really don't need any medicine". R Safety  Is in place and poc cont.

## 2015-11-22 NOTE — BHH Suicide Risk Assessment (Signed)
[facility] Admission Suicide Risk Assessment   Nursing information obtained from:    Demographic factors:    Current Mental Status:    Loss Factors:    Historical Factors:    Risk Reduction Factors:     Total Time spent with patient: 45 minutes Principal Problem: Bizarre behavior Diagnosis:   Patient Active Problem List   Diagnosis Date Noted  . Psychosis [F29] 11/21/2015  . Bizarre behavior [R46.2] 11/21/2015   Subjective Data: Patient is 60 year old Caucasian female who was admitted due to manic symptoms.  She was found to have flight of ideas, rambling speech, erratic behavior and she was found to be agitated at home.  Upon admission her blood alcohol level was 159 .  Please see history and physical for more details.  Continued Clinical Symptoms:  Alcohol Use Disorder Identification Test Final Score (AUDIT): 13 The "Alcohol Use Disorders Identification Test", Guidelines for Use in Primary Care, Second Edition.  World Pharmacologist [facility]). Score between 0-7:  no or low risk or alcohol related problems. Score between 8-15:  moderate risk of alcohol related problems. Score between 16-19:  high risk of alcohol related problems. Score 20 or above:  warrants further diagnostic evaluation for alcohol dependence and treatment.   CLINICAL FACTORS:   Bipolar Disorder:   Mixed State Alcohol/Substance Abuse/Dependencies More than one psychiatric diagnosis   Musculoskeletal: Strength & Muscle Tone: within normal limits Gait & Station: normal Patient leans: Backward  Psychiatric Specialty Exam: ROS  Blood pressure 123/75, pulse 52, temperature 98 F (36.7 C), temperature source Oral, resp. rate 16, height 5\' 2"  (1.575 m), weight 62.596 kg (138 lb), SpO2 100 %.Body mass index is 25.23 kg/(m^2).  General Appearance: Casual  Eye Contact::  Fair  Speech:  Pressured and Fast and rambling  Volume:  Increased  Mood:  Euphoric and Irritable  Affect:  Labile  Thought Process:   Circumstantial, Disorganized, Irrelevant, Loose and Tangential  Orientation:  Full (Time, Place, and Person)  Thought Content:  Rumination  Suicidal Thoughts:  No  Homicidal Thoughts:  No  Memory:  Immediate;   Fair Recent;   Fair Remote;   Fair  Judgement:  Impaired  Insight:  Shallow  Psychomotor Activity:  Restlessness  Concentration:  Fair  Recall:  [place]: Fair  Akathisia:  No  Handed:  Right  AIMS (if indicated):     Assets:  Desire for Improvement Housing Physical Health  Sleep:  Number of Hours: 6.75  Cognition: WNL  ADL's:  Intact    COGNITIVE FEATURES THAT CONTRIBUTE TO RISK:  Closed-mindedness, Polarized thinking and Thought constriction (tunnel vision)    SUICIDE RISK:   Minimal: No identifiable suicidal ideation.  Patients presenting with no risk factors but with morbid ruminations; may be classified as minimal risk based on the severity of the depressive symptoms  PLAN OF CARE: Patient is 60 year old Caucasian female who was admitted due to extreme mania, rambling and pressured speech with flight of ideas.  She was found to have erratic behavior.  Her blood alcohol level was 159.  Patient requires inpatient treatment and stabilization.  Please see history and physical for more details.  I certify that inpatient services furnished can reasonably be expected to improve the patient's condition.   Arryanna Holquin T., MD 11/22/2015, 12:56 PM

## 2015-11-22 NOTE — BHH Group Notes (Signed)
[place] Group Notes:  (Clinical Social Work)  11/22/2015  11:15-12:00PM  Summary of Progress/Problems:   Today's process group involved patients discussing their feelings related to being hospitalized, as well as how they can use their present feelings to make a plan for how to stay out of the hospital.  There was an emphasis on setting boundaries, making "I" statements, going to AA/support groups/therapy/doctor's appointments. The patient expressed her primary feeling about being hospitalized is "peaceful.  I feel I am in the right place at the right time."  She was tangential and somewhat monopolizing in group, but her comments were in general cogent to the discussion.  Type of Therapy:  Group Therapy - Process  Participation Level:  Active  Participation Quality:  Attentive, Monopolizing and Redirectable and Supportive  Affect:  Appropriate  Cognitive:  Disorganized  Insight:  Developing/Improving  Engagement in Therapy:  Engaged  Modes of Intervention:  Exploration, Discussion  Selmer Dominion, LCSW 11/22/2015, 12:50 PM
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
## 2015-11-23 DIAGNOSIS — F319 Bipolar disorder, unspecified: Secondary | ICD-10-CM

## 2015-11-23 MED ORDER — QUETIAPINE FUMARATE 50 MG PO TABS
50.0000 mg | ORAL_TABLET | Freq: Every day | ORAL | Status: DC
Start: 2015-11-23 — End: 2015-11-27
  Administered 2015-11-23 – 2015-11-26 (×4): 50 mg via ORAL
  Filled 2015-11-23 (×6): qty 1

## 2015-11-23 NOTE — Progress Notes (Signed)
DAR NOTE: Patient presents with anxious mood and affect.  Denies pain, auditory and visual hallucinations.  Rates depression at 0, hopelessness at 0, and anxiety at 4.  Describes energy level as normal and concentration as poor.  Maintained on routine safety checks.  Medications given as prescribed.  Support and encouragement offered as needed.  Attended group and participated.  States goal for today is "get organized with things and schedules."  Patient observed socializing with peers in the dayroom.  Offered no complaint.

## 2015-11-23 NOTE — [facility] Group Notes (Signed)
[place] Group Notes:  (Clinical Social Work)  11/23/2015  [place] Group Notes:  (Clinical Social Work)  11/23/2015  11:00AM-12:00PM  Summary of Progress/Problems:  The main focus of today's process group was to listen to a variety of genres of music and to identify that different types of music provoke different responses.  The patient then was able to identify personally what was soothing for them, as well as energizing.   The patient expressed understanding of concepts, as well as knowledge of how each type of music affected her and how this can be used at home as a wellness/recovery tool.  At one song, she sobbed uncontrollably but refused for CSW to turn the song off.  As soon as the song was over, she became happy again.  She stated she felt better at the end of group than at the beginning.  She asked for a list of the songs played during group and said she will make them into a CD to use at home for encouragement/inspiration.  Type of Therapy:  Music Therapy   Participation Level:  Active  Participation Quality:  Attentive and Sharing  Affect:  Blunted and Tearful and Labile  Cognitive:  Oriented  Insight:  Engaged  Engagement in Therapy:  Engaged  Modes of Intervention:   Activity, Exploration  Selmer Dominion, LCSW 11/23/2015

## 2015-11-23 NOTE — Progress Notes (Signed)
D: Pt is alert and oriented x 4; Pt is preoccupied about forgetting things; she states, "Sometimes I just get myself worked-up because I think I may forget things that could be important." Pt denies depression, anxiety, pain, SI, HI and AVH; states, "I can feel some good changes in my body." Pt remained calm and cooperative through the shift assessment. A: Medications offered as prescribed.  Support, encouragement, and safe environment provided.  15-minute safety checks continue. R: Pt was med compliant.  Pt attended group. Safety checks continue

## 2015-11-23 NOTE — Progress Notes (Signed)
D: Pt may be somewhat confused every now and then; Pt is preoccupied about forgetting things she needs to do; she states, "I'm here, I just feel that I have forgotten to do something important." Pt denies depression, anxiety, pain, SI, HI and AVH; states, "I feel good, I think am processing better now." Pt remained calm and cooperative through the shift assessment. A: Medications offered as prescribed.  Support, encouragement, and safe environment provided. 15-minute safety checks continue. R: Pt was med compliant. Safety checks continue.

## 2015-11-23 NOTE — Progress Notes (Signed)
[facility] MD Progress Note  11/23/2015 10:05 AM Michelle Levy  MRN:  [id number] Subjective:  I am is still manic.  I have too much energy. Objective; Patient is 60 year old Caucasian female who was admitted due to bizarre behavior and manic symptoms.  She was found to have rambling speech with flight if ideas, grandiosity and having hallucination. Patient seen chart reviewed.  Patient remains with manic symptoms.  She was seen sitting in the hallway trying to put puzzle together.  She admitted that she feels very nervous and too much energy.  She sleeping on and off.  She denies any suicidal thoughts but admitted that she continued to hear voices and she preoccupied with her grandiosity.  She is not disruptive in the unit but she is easily distracted with labile mood.  Her thought processes remain tangential and disorganized.  Principal Problem: Bizarre behavior , bipolar disorder   Diagnosis:   Patient Active Problem List   Diagnosis Date Noted  . Psychosis [F29] 11/21/2015  . Bizarre behavior [R46.2] 11/21/2015   Total Time spent with patient: 30 minutes  Past Psychiatric History: See history and physical  Past Medical History: History reviewed. No pertinent past medical history. History reviewed. No pertinent past surgical history. Family History: History reviewed. No pertinent family history. Family Psychiatric  History: See history and physical Social History:  History  Alcohol Use  . 2.4 - 3.0 oz/week  . 4-5 Glasses of wine, 0 Cans of beer, 0 Shots of liquor per week     History  Drug Use No    Social History   Social History  . Marital Status: Married    Spouse Name: N/A  . Number of Children: N/A  . Years of Education: N/A   Social History Main Topics  . Smoking status: Never Smoker   . Smokeless tobacco: Never Used  . Alcohol Use: 2.4 - 3.0 oz/week    4-5 Glasses of wine, 0 Cans of beer, 0 Shots of liquor per week  . Drug Use: No  . Sexual Activity: No   Other  Topics Concern  . None   Social History Narrative   Additional Social History:    Pain Medications: n/a Prescriptions: n/a Over the Counter: n/a History of alcohol / drug use?: Yes Longest period of sobriety (when/how long): unknown Negative Consequences of Use: Legal, Personal relationships, Work / School Withdrawal Symptoms: Patient aware of relationship between substance abuse and physical/medical complications Name of Substance 1: alcohol 1 - Age of First Use:  ? 60yo 1 - Amount (size/oz): AMAP 1 - Frequency: QD 1 - Duration: as long as possible                  Sleep: Fair  Appetite:  Fair  Current Medications: Current Facility-Administered Medications  Medication Dose Route Frequency Provider Last Rate Last Dose  . acetaminophen (TYLENOL) tablet 650 mg  650 mg Oral Q6H PRN Benjamine Mola, FNP      . alum & mag hydroxide-simeth (MAALOX/MYLANTA) 200-200-20 MG/5ML suspension 30 mL  30 mL Oral Q4H PRN Benjamine Mola, FNP      . chlordiazePOXIDE (LIBRIUM) capsule 25 mg  25 mg Oral Q6H PRN Kerrie Buffalo, NP      . hydrOXYzine (ATARAX/VISTARIL) tablet 25 mg  25 mg Oral Q6H PRN Kerrie Buffalo, NP   25 mg at 11/22/15 0524  . loperamide (IMODIUM) capsule 2-4 mg  2-4 mg Oral PRN Kerrie Buffalo, NP      .  magnesium hydroxide (MILK OF MAGNESIA) suspension 30 mL  30 mL Oral Daily PRN Benjamine Mola, FNP      . multivitamin with minerals tablet 1 tablet  1 tablet Oral Daily Kerrie Buffalo, NP   1 tablet at 11/23/15 0800  . ondansetron (ZOFRAN-ODT) disintegrating tablet 4 mg  4 mg Oral Q6H PRN Kerrie Buffalo, NP      . QUEtiapine (SEROQUEL) tablet 50 mg  50 mg Oral QHS Kathlee Nations, MD      . thiamine (VITAMIN B-1) tablet 100 mg  100 mg Oral Daily Kerrie Buffalo, NP   100 mg at 11/23/15 0800    Lab Results:  Results for orders placed or performed during the hospital encounter of 11/21/15 (from the past 48 hour(s))  Lipid panel     Status: Abnormal   Collection Time: 11/22/15   6:30 AM  Result Value Ref Range   Cholesterol 229 (H) 0 - 200 mg/dL   Triglycerides 95 <150 mg/dL   HDL 109 >40 mg/dL   Total CHOL/HDL Ratio 2.1 RATIO   VLDL 19 0 - 40 mg/dL   LDL Cholesterol 101 (H) 0 - 99 mg/dL    Comment:        Total Cholesterol/HDL:CHD Risk Coronary Heart Disease Risk Table                     Men   Women  1/2 Average Risk   3.4   3.3  Average Risk       5.0   4.4  2 X Average Risk   9.6   7.1  3 X Average Risk  23.4   11.0        Use the calculated Patient Ratio above and the CHD Risk Table to determine the patient's CHD Risk.        ATP III CLASSIFICATION (LDL):  <100     mg/dL   Optimal  100-129  mg/dL   Near or Above                    Optimal  130-159  mg/dL   Borderline  160-189  mg/dL   High  >190     mg/dL   Very High Performed at [facility]   T4, free     Status: Abnormal   Collection Time: 11/22/15  6:30 AM  Result Value Ref Range   Free T4 1.40 (H) 0.61 - 1.12 ng/dL    Comment: Performed at [facility]  TSH     Status: None   Collection Time: 11/22/15  6:30 AM  Result Value Ref Range   TSH 1.295 0.350 - 4.500 uIU/mL    Comment: Performed at [facility]    Physical Findings: AIMS: Facial and Oral Movements Muscles of Facial Expression: None, normal Lips and Perioral Area: None, normal Jaw: None, normal Tongue: None, normal,Extremity Movements Upper (arms, wrists, hands, fingers): None, normal Lower (legs, knees, ankles, toes): None, normal, Trunk Movements Neck, shoulders, hips: None, normal, Overall Severity Severity of abnormal movements (highest score from questions above): None, normal Incapacitation due to abnormal movements: None, normal Patient's awareness of abnormal movements (rate only patient's report): No Awareness, Dental Status Current problems with teeth and/or dentures?: No Does patient usually wear dentures?: No  CIWA:  CIWA-Ar Total: 2 COWS:  COWS Total Score:  4  Musculoskeletal: Strength & Muscle Tone: within normal limits Gait & Station: normal Patient leans: N/A  Psychiatric  Specialty Exam: Review of Systems  Neurological: Negative for dizziness and tremors.  Psychiatric/Behavioral: Positive for hallucinations. The patient is nervous/anxious and has insomnia.     Blood pressure 122/81, pulse 116, temperature 98.2 F (36.8 C), temperature source Oral, resp. rate 16, height 5\' 2"  (1.575 m), weight 62.596 kg (138 lb), SpO2 100 %.Body mass index is 25.23 kg/(m^2).  General Appearance: Fairly Groomed and Guarded  Engineer, water::  Good  Speech:  Pressured and Fast  Volume:  Increased  Mood:  Depressed and Euphoric  Affect:  Labile  Thought Process:  Circumstantial, Irrelevant, Loose and Tangential  Orientation:  Full (Time, Place, and Person)  Thought Content:  Hallucinations: Auditory and Rumination  Suicidal Thoughts:  No  Homicidal Thoughts:  No  Memory:  Immediate;   Fair Recent;   Fair Remote;   Fair  Judgement:  Impaired  Insight:  Lacking  Psychomotor Activity:  Restlessness  Concentration:  Fair  Recall:  [employer] of Knowledge:Fair  Language: Fair  Akathisia:  No  Handed:  Right  AIMS (if indicated):     Assets:  Desire for Improvement Housing  ADL's:  Intact  Cognition: WNL  Sleep:  Number of Hours: 6.75   Treatment Plan Summary: Daily contact with patient to assess and evaluate symptoms and progress in treatment and Medication management  Increase Seroquel 50 mg at bedtime to help paranoia, psychosis and manic symptoms. Continue Librium 25 mg as needed for anxiety and withdrawal symptoms. Review blood work results, sodium 132, chloride 98, CO2 19, glucose 104, BUN 22.  Her TSH is normal.  Her lipid panel shows cholesterol 229 and LDL 101.  Upon admission her blood alcohol level was 159.  However patient does not have any tremors shakes or any withdrawal symptoms.  Her CBC is normal.  Her hemoglobin A1c and prolactin  level is pending. Continue to encourage group milieu therapy.  Discussed medication side effects in detail including potential side effects, metabolic syndrome and EPS. Restart home medication for her health problems. Start discharge planning.  Laron Boorman T., MD 11/23/2015, 10:05 AM

## 2015-11-23 NOTE — Progress Notes (Signed)
[place] Group Notes:  (Nursing/MHT/Case Management/Adjunct)  Date:  11/23/2015  Time:  1:57 AM  Type of Therapy:  Psychoeducational Skills  Participation Level:  Active  Participation Quality:  Appropriate  Affect:  Appropriate  Cognitive:  Appropriate  Insight:  Appropriate  Engagement in Group:  Engaged  Modes of Intervention:  Education  Summary of Progress/Problems: Patient states that she had a good day overall for a number of reasons. She states that "things are clearer" for her mentally. In addition, she verbalized that she was able to journal during the daytime. In terms of the theme for the day, her coping skill will be to work on her breathing and stretching.   Aislinn Feliz S 11/23/2015, 1:57 AM

## 2015-11-24 DIAGNOSIS — F3163 Bipolar disorder, current episode mixed, severe, without psychotic features: Principal | ICD-10-CM

## 2015-11-24 DIAGNOSIS — Z634 Disappearance and death of family member: Secondary | ICD-10-CM

## 2015-11-24 DIAGNOSIS — F1020 Alcohol dependence, uncomplicated: Secondary | ICD-10-CM

## 2015-11-24 LAB — HEMOGLOBIN A1C
Hgb A1c MFr Bld: 5.4 % (ref 4.8–5.6)
Mean Plasma Glucose: 108 mg/dL

## 2015-11-24 LAB — PROLACTIN: Prolactin: 14.9 ng/mL (ref 4.8–23.3)

## 2015-11-24 MED ORDER — LAMOTRIGINE 25 MG PO TABS
25.0000 mg | ORAL_TABLET | Freq: Every day | ORAL | Status: DC
  Administered 2015-11-24 – 2015-11-26 (×3): 25 mg via ORAL
  Filled 2015-11-24 (×5): qty 1

## 2015-11-24 NOTE — Plan of Care (Signed)
Problem: Alteration in mood; excessive anxiety as evidenced by: Goal: STG-Pt will report an absence of self-harm thoughts/actions (Patient will report an absence of self-harm thoughts or actions)  Outcome: Progressing Pt denies SI, HI, AVH and pain when assessed. Stated to Probation officer "I realized that I need to do the best for me than worrying about everyone and everything else; that's how I ended up here". Pt has been pleasant with mild confusion but is verbally redirectable.

## 2015-11-24 NOTE — [facility] Group Notes (Signed)
[facility] LCSW Group Therapy  11/24/2015 5:45 PM  Type of Therapy:  Group Therapy  Participation Level:  Active  Participation Quality:  Attentive  Affect:  Anxious and Appropriate  Cognitive:  Alert  Insight:  Improving  Engagement in Therapy:  Engaged  Modes of Intervention:  Discussion, Exploration and Problem-solving  Summary of Progress/Problems: Today's Topic: Overcoming Obstacles. Patients identified one short term goal and potential obstacles in reaching this goal. Patients processed barriers involved in overcoming these obstacles. Patients identified steps necessary for overcoming these obstacles and explored motivation (internal and external) for facing these difficulties head on. Today's Topic: Overcoming Obstacles. Patients identified one short term goal and potential obstacles in reaching this goal. Patients processed barriers involved in overcoming these obstacles. Patients identified steps necessary for overcoming these obstacles and explored motivation (internal and external) for facing these difficulties head on. Identified lack of knowledge about her insurance coverage as obstabcle to seeking/receiving help w mental illness management.  Will ask friend to explore her policy and give her information in order to feel more confident about her ability to seek help at discharge.     Michelle Levy 11/24/2015, 5:45 PM

## 2015-11-24 NOTE — Progress Notes (Addendum)
[facility] MD Progress Note  11/24/2015 2:20 PM Michelle Levy  MRN:  [id number] Subjective:  ' I was going through a lot . I felt like I had extreme energy and I wanted to celebrate my freedom.'   Objective; Patient is 60 year old Caucasian female , divorced , employed , who lives in [place] , who denies a past diagnosis of mental illness . Pt as per initial notes in EHR  was admitted due to bizarre behavior and manic symptoms.  She was found to have rambling speech with flight if ideas, grandiosity and having hallucination.  Patient seen chart reviewed, case discussed with treatment team .   Patient today seen as pressured , labile - seen as tearful when she talked about her stressors. Pt with several psychosocial stressors like the recent death of her sister in 10/29/22 , death of her son's mother in law, relational issues with her boyfriend . Pt also was abusing alcohol , reports using atleast 3-4 glasses of wine and liquor on and off . Pt reports that she feels better today - is tolerating her medications well. Denies any new concerns. Per staff - pt is pressured , less confused than when she came in , denies disruptive issues on the unit.    Principal Problem: Bipolar disorder, curr episode mixed, severe, w/o psychotic features ([place])   Diagnosis:   Patient Active Problem List   Diagnosis Date Noted  . Bipolar disorder, curr episode mixed, severe, w/o psychotic features ([place]) [F31.63] 11/24/2015  . Alcohol use disorder, severe, dependence ([place]) [F10.20] 11/24/2015  . Bereavement [Z63.4] 11/24/2015   Total Time spent with patient: 30 minutes  Past Psychiatric History: See history and physical  Past Medical History: denies Family History: Renal problems in sister Family Psychiatric  History:grand son has ADD, sister had bipolar disorder, mother - depression Social History: Pt is divorced, employed , lives by self in [place]. History  Alcohol Use  . 2.4 - 3.0 oz/week  . 4-5 Glasses of wine, 0  Cans of beer, 0 Shots of liquor per week     History  Drug Use No    Social History   Social History  . Marital Status: Married    Spouse Name: N/A  . Number of Children: N/A  . Years of Education: N/A   Social History Main Topics  . Smoking status: Never Smoker   . Smokeless tobacco: Never Used  . Alcohol Use: 2.4 - 3.0 oz/week    4-5 Glasses of wine, 0 Cans of beer, 0 Shots of liquor per week  . Drug Use: No  . Sexual Activity: No   Other Topics Concern  . None   Social History Narrative   Additional Social History:    Pain Medications: n/a Prescriptions: n/a Over the Counter: n/a History of alcohol / drug use?: Yes Longest period of sobriety (when/how long): unknown Negative Consequences of Use: Legal, Personal relationships, Work / School Withdrawal Symptoms: Patient aware of relationship between substance abuse and physical/medical complications Name of Substance 1: alcohol 1 - Age of First Use:  ? 60yo 1 - Amount (size/oz): AMAP 1 - Frequency: QD 1 - Duration: as long as possible                  Sleep: Fair  Appetite:  Fair  Current Medications: Current Facility-Administered Medications  Medication Dose Route Frequency Provider Last Rate Last Dose  . acetaminophen (TYLENOL) tablet 650 mg  650 mg Oral Q6H PRN Benjamine Mola, FNP      .  alum & mag hydroxide-simeth (MAALOX/MYLANTA) 200-200-20 MG/5ML suspension 30 mL  30 mL Oral Q4H PRN Benjamine Mola, FNP      . chlordiazePOXIDE (LIBRIUM) capsule 25 mg  25 mg Oral Q6H PRN Kerrie Buffalo, NP      . hydrOXYzine (ATARAX/VISTARIL) tablet 25 mg  25 mg Oral Q6H PRN Kerrie Buffalo, NP   25 mg at 11/22/15 0524  . lamoTRIgine (LAMICTAL) tablet 25 mg  25 mg Oral Q supper Ursula Alert, MD      . loperamide (IMODIUM) capsule 2-4 mg  2-4 mg Oral PRN Kerrie Buffalo, NP      . magnesium hydroxide (MILK OF MAGNESIA) suspension 30 mL  30 mL Oral Daily PRN Benjamine Mola, FNP      . multivitamin with minerals tablet  1 tablet  1 tablet Oral Daily Kerrie Buffalo, NP   1 tablet at 11/24/15 [phone]  . ondansetron (ZOFRAN-ODT) disintegrating tablet 4 mg  4 mg Oral Q6H PRN Kerrie Buffalo, NP      . QUEtiapine (SEROQUEL) tablet 50 mg  50 mg Oral QHS Kathlee Nations, MD   50 mg at 11/23/15 2144  . thiamine (VITAMIN B-1) tablet 100 mg  100 mg Oral Daily Kerrie Buffalo, NP   100 mg at 11/24/15 [id number]    Lab Results:  No results found for this or any previous visit (from the past 48 hour(s)).  Physical Findings: AIMS: Facial and Oral Movements Muscles of Facial Expression: None, normal Lips and Perioral Area: None, normal Jaw: None, normal Tongue: None, normal,Extremity Movements Upper (arms, wrists, hands, fingers): None, normal Lower (legs, knees, ankles, toes): None, normal, Trunk Movements Neck, shoulders, hips: None, normal, Overall Severity Severity of abnormal movements (highest score from questions above): None, normal Incapacitation due to abnormal movements: None, normal Patient's awareness of abnormal movements (rate only patient's report): No Awareness, Dental Status Current problems with teeth and/or dentures?: No Does patient usually wear dentures?: No  CIWA:  CIWA-Ar Total: 1 COWS:  COWS Total Score: 4  Musculoskeletal: Strength & Muscle Tone: within normal limits Gait & Station: normal Patient leans: N/A  Psychiatric Specialty Exam: Review of Systems  Psychiatric/Behavioral: Positive for depression. The patient is nervous/anxious.   All other systems reviewed and are negative.   Blood pressure 136/82, pulse 79, temperature 98.2 F (36.8 C), temperature source Oral, resp. rate 16, height 5\' 2"  (1.575 m), weight 62.596 kg (138 lb), SpO2 100 %.Body mass index is 25.23 kg/(m^2).  General Appearance: Fairly Groomed  Engineer, water::  Good  Speech:  Pressured and Fast  Volume:  Increased  Mood:  Depressed  Affect:  Labile  Thought Process:  Circumstantial  Orientation:  Full (Time, Place, and  Person)  Thought Content:  Paranoid Ideation and Rumination  Suicidal Thoughts:  No  Homicidal Thoughts:  No  Memory:  Immediate;   Fair Recent;   Fair Remote;   Fair  Judgement:  Impaired  Insight:  Lacking  Psychomotor Activity:  Restlessness  Concentration:  Fair  Recall:  [employer] of Knowledge:Fair  Language: Fair  Akathisia:  No  Handed:  Right  AIMS (if indicated):     Assets:  Desire for Improvement Housing  ADL's:  Intact  Cognition: WNL  Sleep:  Number of Hours: 5.75   Treatment Plan Summary:Patient is 60 year old Caucasian female , who has no past hx of mental illness, presents with mood sx, tearfulness . Pt today continues to be labile , tearful , reports periods of high  energy, grandiose ideations , as well as mood sx. Will readjust medications - start treatment. Daily contact with patient to assess and evaluate symptoms and progress in treatment and Medication management  Continue Seroquel 50 mg at bedtime to help paranoia, psychosis and manic symptoms. Add Lamictal 25 mg po qpm for mood sx. Continue Librium 25 mg as needed for anxiety and withdrawal symptoms. Review blood work results- TSH - wnl, T4 is elevated - discussed to follow up with out pt provider to repeat labs as needed.Will get EKG for qtc. Continue to encourage group milieu therapy.   Recreational therapy consult. Restart home medication for her health problems. CSW will work on disposition.  Grayson Pfefferle, MD 11/24/2015, 2:20 PM

## 2015-11-24 NOTE — BHH Group Notes (Signed)
[facility] LCSW Aftercare Discharge Planning Group Note   11/24/2015 12:55 PM  Participation Quality:  Attentive,appropriate  Mood/Affect:  Appropriate  Depression Rating:  0  Anxiety Rating:  0  Thoughts of Suicide:  No Will you contract for safety?   NA  Current AVH:  Yes, "mind is chaotic, wonder what's real and what's fantasy; was afraid of medications because sister died of medications error; "I need to concentrate on myself"  Plan for Discharge/Comments:  Will return home and follow up w [facility]   Transportation Means: friend  Supports:  Doristine Devoid supports through [place] group  Beverely Pace

## 2015-11-24 NOTE — Tx Team (Signed)
  Interdisciplinary Treatment Plan Update (Adult)  Date: 11/24/2015  Time Reviewed: 5:00 PM   Progress in Treatment: Attending groups: Yes. Participating in groups:  Yes. Taking medication as prescribed:  Yes. Tolerating medication:  Yes. Family/Significant othe contact made:   Patient understands diagnosis:  Yes. Discussing patient identified problems/goals with staff:  Yes. Medical problems stabilized or resolved:  Yes. Denies suicidal/homicidal ideation: Yes. Issues/concerns per patient self-inventory:  Other:  New problem(s) identified:    Discharge Plan or Barriers:   Reason for Continuation of Hospitalization: Hallucinations Mania Medication stabilization  Comments:  Patient reports disorganized mind, distressed by inability to determine "what is real and what is not."  Attributes current state to drinking 3 glasses of wine.  No prior mental health history.  Concerns about medication as sister died recently from side effected from unknown medications.  Concerned about insurance coverage for medications and outpatient care.    Estimated length of stay:  5 - 7 days  New goal(s): Treatment of psychosis sx  Additional Comments:  Patient and CSW reviewed pt's identified goals and treatment plan. Patient verbalized understanding and agreed to treatment plan. CSW reviewed [facility] "Discharge Process and Patient Involvement" Form. Pt verbalized understanding of information provided and signed form.    Review of initial/current patient goals per problem list:    1.  Goal(s): Patient will participate in aftercare plan  Met:  Yes  Target date: at discharge  As evidenced by: Patient will participate within aftercare plan AEB aftercare provider and housing plan at discharge being identified.  2/6:  Fraser Din has appt w [facility].  Goal met.  2.  Goal (s): Patient will exhibit decreased signs of psychosis.  Met:  No   Target date: at discharge As evidenced by:  Patient will display reduced signs/sx of psychosis and/or return to baseline.  2/6:  Patient states that her mind is "chaotic"; however, she does feel medication regimen is helping her to tell  difference between "what is real and what is fantasy."  "I want to stay until my mind is right."  Goal progressing.    Attendees: Patient:   11/24/2015 4:59 PM   Family:   11/24/2015 4:59 PM   Physician:  Ursula Alert, MD 11/24/2015 4:59 PM   Nursing:   Phillis Haggis, RN; Ronnie RN 11/24/2015 4:59 PM   Clinical Social Worker: Edwyna Shell, LCSW 11/24/2015 4:59 PM   Clinical Social Worker:  11/24/2015 4:59 PM   Other:  Norberto Sorenson, [place] 11/24/2015 4:59 PM    11/24/2015 4:59 PM   Other:   11/24/2015 4:59 PM   Other:  11/24/2015 4:59 PM   Other:  11/24/2015 4:59 PM   Other:  11/24/2015 4:59 PM    11/24/2015 4:59 PM    11/24/2015 4:59 PM    11/24/2015 4:59 PM    11/24/2015 4:59 PM    Scribe for Treatment Team:   Edwyna Shell, LCSW 11/24/2015 4:59 PM

## 2015-11-24 NOTE — Progress Notes (Signed)
Nursing Note:  Nurse performing EKG on patient per order; has been placed on the front of the chart.

## 2015-11-24 NOTE — Progress Notes (Signed)
Adult Psychoeducational Group Note  Date:  11/24/2015 Time:  9:28 PM  Group Topic/Focus:  Wrap-Up Group:   The focus of this group is to help patients review their daily goal of treatment and discuss progress on daily workbooks.  Participation Level:  Active  Participation Quality:  Appropriate  Affect:  Appropriate  Cognitive:  Appropriate  Insight: Appropriate  Engagement in Group:  Engaged  Modes of Intervention:  Discussion  Additional Comments: The patient expressed that she had a good day.The patient also said that she attended group.  Nash Shearer 11/24/2015, 9:28 PM

## 2015-11-24 NOTE — Progress Notes (Signed)
Recreation Therapy Notes  02.06.2017 LRT attempted to work with patient 1:1, patient observed to be on the phone, nursing staff waiting to complete EKG. LRT will return to work with patient during admission.   Laureen Ochs Rawson Minix, LRT/CTRS  Lenya Sterne L 11/24/2015 3:45 PM

## 2015-11-24 NOTE — Plan of Care (Signed)
Problem: Ineffective individual coping Goal: STG: Patient will remain free from self harm Outcome: Progressing Pt remains safe on and off unit on Q 15 minutes checks as ordered without self injurious behavior to note at present. Pt is intrusive at times but is verbally redirectable.

## 2015-11-25 MED ORDER — LORAZEPAM 0.5 MG PO TABS: 0.5000 mg | ORAL_TABLET | Freq: Four times a day (QID) | ORAL | Status: DC | PRN

## 2015-11-25 NOTE — BHH Group Notes (Signed)
[facility] Group Notes:  (Nursing/MHT/Case Management/Adjunct)  Date:  11/25/2015  Time:  10:00  Type of Therapy:  Nurse Education  Participation Level:  Active  Participation Quality:  Attentive  Affect:  Flat  Cognitive:  Alert  Insight:  Limited  Engagement in Group:  Supportive and sharing  Modes of Intervention:  Discussion and Education  Summary of Progress/Problems:  Group topic was Recovery.  Discussed setting appropriate goals, importance of sleeping and making sure to use coping skills.  Michelle Levy talked about how she is going to work on her coping skills, mainly breathing, to help when she is feeling bad.

## 2015-11-25 NOTE — Progress Notes (Signed)
D: Pt is alert and oriented x 4. Pt at this time denies any depression, anxiety, pain, SI, HI and AVH; she states, "I think that medicine is working; I understand myself very well now and I'm totally at peace." Pt could however, be isolative and withdrawn to self; "I spend a lot of my time coloring. Pt remained calm and cooperative through the shift assessment. A: Medications offered as prescribed.  Support, encouragement, and safe environment provided. 15-minute safety checks continue. R: R: Pt was med compliant.  Pt attended wrap-up group. Safety checks continue.

## 2015-11-25 NOTE — Progress Notes (Signed)
Adult Psychoeducational Group Note  Date:  11/25/2015 Time:  8:56 PM  Group Topic/Focus:  Wrap-Up Group:   The focus of this group is to help patients review their daily goal of treatment and discuss progress on daily workbooks.  Participation Level:  Active  Participation Quality:  Appropriate  Affect:  Appropriate  Cognitive:  Appropriate  Insight: Appropriate  Engagement in Group:  Engaged  Modes of Intervention:  Discussion  Additional Comments:  The patient expressed that she rates her day a 10.The patient also said that she attended group.  Nash Shearer 11/25/2015, 8:56 PM

## 2015-11-25 NOTE — Progress Notes (Signed)
[facility] MD Progress Note  11/25/2015 3:14 PM Michelle Levy  MRN:  [id number] Subjective:  Pt states " I feel better today . I had an episode this AM when I felt like my heart was racing , but then the nurse asked me if drank coffee and I was able to make the connection. I am trying to relax by doing yoga and breathing.'    Objective; Patient is 60 year old Caucasian female , divorced , employed , who lives in [place] , who denies a past diagnosis of mental illness . Pt as per initial notes in EHR  was admitted due to bizarre behavior and manic symptoms.  She was found to have rambling speech with flight if ideas, grandiosity and having hallucination.  Patient seen chart reviewed, case discussed with treatment team .   Patient today seen as less pressured than yesterday. Pt with more organized thought process , is less labile than on admission.  Pt with several psychosocial stressors like the recent death of her sister in 11/13/22 , death of her son's mother in law, relational issues with her boyfriend .  Pt today able to better cope with her anxiety sx, discussed making use of PRN medications as needed. Per staff - pt with anxiety issues , somatic complaints - continues to need a lot of encouragement and support. Pt with alcohol abuse - is motivated to attend [employer] on [address].     Principal Problem: Bipolar disorder, curr episode mixed, severe, w/o psychotic features ([place])   Diagnosis:   Patient Active Problem List   Diagnosis Date Noted  . Bipolar disorder, curr episode mixed, severe, w/o psychotic features ([place]) [F31.63] 11/24/2015  . Alcohol use disorder, severe, dependence ([place]) [F10.20] 11/24/2015  . Bereavement [Z63.4] 11/24/2015   Total Time spent with patient: 25 minutes  Past Psychiatric History: See history and physical  Past Medical History: denies Family History: Renal problems in sister Family Psychiatric  History:grand son has ADD, sister had bipolar disorder, mother -  depression Social History: Pt is divorced, employed , lives by self in [place]. History  Alcohol Use  . 2.4 - 3.0 oz/week  . 4-5 Glasses of wine, 0 Cans of beer, 0 Shots of liquor per week     History  Drug Use No    Social History   Social History  . Marital Status: Married    Spouse Name: N/A  . Number of Children: N/A  . Years of Education: N/A   Social History Main Topics  . Smoking status: Never Smoker   . Smokeless tobacco: Never Used  . Alcohol Use: 2.4 - 3.0 oz/week    4-5 Glasses of wine, 0 Cans of beer, 0 Shots of liquor per week  . Drug Use: No  . Sexual Activity: No   Other Topics Concern  . None   Social History Narrative   Additional Social History:    Pain Medications: n/a Prescriptions: n/a Over the Counter: n/a History of alcohol / drug use?: Yes Longest period of sobriety (when/how long): unknown Negative Consequences of Use: Legal, Personal relationships, Work / School Withdrawal Symptoms: Patient aware of relationship between substance abuse and physical/medical complications Name of Substance 1: alcohol 1 - Age of First Use:  ? 60yo 1 - Amount (size/oz): AMAP 1 - Frequency: QD 1 - Duration: as long as possible                  Sleep: Fair  Appetite:  Fair  Current  Medications: Current Facility-Administered Medications  Medication Dose Route Frequency Provider Last Rate Last Dose  . acetaminophen (TYLENOL) tablet 650 mg  650 mg Oral Q6H PRN Benjamine Mola, FNP      . alum & mag hydroxide-simeth (MAALOX/MYLANTA) 200-200-20 MG/5ML suspension 30 mL  30 mL Oral Q4H PRN Benjamine Mola, FNP      . lamoTRIgine (LAMICTAL) tablet 25 mg  25 mg Oral Q supper Ursula Alert, MD   25 mg at 11/24/15 1703  . LORazepam (ATIVAN) tablet 0.5 mg  0.5 mg Oral Q6H PRN Ursula Alert, MD      . magnesium hydroxide (MILK OF MAGNESIA) suspension 30 mL  30 mL Oral Daily PRN Benjamine Mola, FNP      . multivitamin with minerals tablet 1 tablet  1 tablet Oral  Daily Kerrie Buffalo, NP   1 tablet at 11/25/15 0804  . QUEtiapine (SEROQUEL) tablet 50 mg  50 mg Oral QHS Kathlee Nations, MD   50 mg at 11/24/15 2114  . thiamine (VITAMIN B-1) tablet 100 mg  100 mg Oral Daily Kerrie Buffalo, NP   100 mg at 11/25/15 [id number]    Lab Results:  No results found for this or any previous visit (from the past 48 hour(s)).  Physical Findings: AIMS: Facial and Oral Movements Muscles of Facial Expression: None, normal Lips and Perioral Area: None, normal Jaw: None, normal Tongue: None, normal,Extremity Movements Upper (arms, wrists, hands, fingers): None, normal Lower (legs, knees, ankles, toes): None, normal, Trunk Movements Neck, shoulders, hips: None, normal, Overall Severity Severity of abnormal movements (highest score from questions above): None, normal Incapacitation due to abnormal movements: None, normal Patient's awareness of abnormal movements (rate only patient's report): No Awareness, Dental Status Current problems with teeth and/or dentures?: No Does patient usually wear dentures?: No  CIWA:  CIWA-Ar Total: 1 COWS:  COWS Total Score: 4  Musculoskeletal: Strength & Muscle Tone: within normal limits Gait & Station: normal Patient leans: N/A  Psychiatric Specialty Exam: Review of Systems  Psychiatric/Behavioral: Positive for depression. The patient is nervous/anxious.   All other systems reviewed and are negative.   Blood pressure 138/83, pulse 81, temperature 98.5 F (36.9 C), temperature source Oral, resp. rate 20, height 5\' 2"  (1.575 m), weight 62.596 kg (138 lb), SpO2 100 %.Body mass index is 25.23 kg/(m^2).  General Appearance: Fairly Groomed  Engineer, water::  Good  Speech:  Pressured  Volume:  Normal  Mood:  Anxious and Depressed improving  Affect:  Labile  Thought Process:  Circumstantial  Orientation:  Full (Time, Place, and Person)  Thought Content:  Paranoid Ideation and Rumination improving  Suicidal Thoughts:  No  Homicidal  Thoughts:  No  Memory:  Immediate;   Fair Recent;   Fair Remote;   Fair  Judgement:  Impaired  Insight:  Lacking  Psychomotor Activity:  Restlessness  Concentration:  Fair  Recall:  [employer] of Knowledge:Fair  Language: Fair  Akathisia:  No  Handed:  Right  AIMS (if indicated):     Assets:  Desire for Improvement Housing  ADL's:  Intact  Cognition: WNL  Sleep:  Number of Hours: 6.25   Treatment Plan Summary:Patient is 60 year old Caucasian female , who has no past hx of mental illness, presents with mood sx, tearfulness . Pt today appears to be less anxious , less pressured , will continue treatment.   Daily contact with patient to assess and evaluate symptoms and progress in treatment and Medication management  Continue  Seroquel 50 mg at bedtime to help paranoia, psychosis and manic symptoms. Continue Lamictal 25 mg po qpm for mood sx. Add Ativan 1 mg po q6h prn as needed for anxiety and withdrawal symptoms. Review blood work results- TSH - wnl, T4 is elevated - discussed to follow up with out pt provider to repeat labs as needed. EKG for qtc - wnl. Continue to encourage group milieu therapy.   Recreational therapy consult. Restart home medication for her health problems. CSW will work on disposition.Patient is motivated to attend AA meetings on [address]  Kian Ottaviano, MD 11/25/2015, 3:14 PM

## 2015-11-25 NOTE — BHH Group Notes (Signed)
[facility] LCSW Group Therapy  11/25/2015 , 3:38 PM   Type of Therapy:  Group Therapy  Participation Level:  Active  Participation Quality:  Attentive  Affect:  Appropriate  Cognitive:  Alert  Insight:  Improving  Engagement in Therapy:  Engaged  Modes of Intervention:  Discussion, Exploration and Socialization  Summary of Progress/Problems: Today's group focused on the term Diagnosis.  Participants were asked to define the term, and then pronounce whether it is a negative, positive or neutral term. Stayed for the entire group, engaged throughout, pressured and tangential.  Admitted that "boundaries" is something that she needs to work on, especially now, so demonstrates some degree of insight. Talked about the importance of the alanon community to her in the past, and how she is interested in continuing with them or AA.    Trish Mage 11/25/2015 , 3:38 PM

## 2015-11-25 NOTE — BHH Suicide Risk Assessment (Signed)
[facility] INPATIENT:  Family/Significant Other Suicide Prevention Education  Suicide Prevention Education:  Education Completed; No one has been identified by the patient as the family member/significant other with whom the patient will be residing, and identified as the person(s) who will aid the patient in the event of a mental health crisis (suicidal ideations/suicide attempt).  With written consent from the patient, the family member/significant other has been provided the following suicide prevention education, prior to the and/or following the discharge of the patient.  The suicide prevention education provided includes the following:  Suicide risk factors  Suicide prevention and interventions  National Suicide Hotline telephone number  [facility] assessment telephone number  [facility] Emergency Assistance [place] and/or Residential Mobile Crisis Unit telephone number  Request made of family/significant other to:  Remove weapons (e.g., guns, rifles, knives), all items previously/currently identified as safety concern.    Remove drugs/medications (over-the-counter, prescriptions, illicit drugs), all items previously/currently identified as a safety concern.  The family member/significant other verbalizes understanding of the suicide prevention education information provided.  The family member/significant other agrees to remove the items of safety concern listed above. The patient did not endorse SI at the time of admission, nor did the patient c/o SI during the stay here.  SPE not required. However, I spoke with daughter Mason Jim, [phone], and we went over a crises plan.  Trish Mage 11/25/2015, 3:43 PM

## 2015-11-25 NOTE — Progress Notes (Signed)
DAR Note: Michelle Levy has been in the dayroom much of the morning.  She was noted sitting at the end of the hall with peers talking.  She denies any SI/HI or A/V hallucinations.  She denies any pain or discomfort.  She states she is anxious at times and and feels like her heart is racing.  Pulse was 84 and regular.  Talked with her about coping skills and she states that she is going to breath more, "I get nervous and forget to breath and I want to work on stretching to help as well."  She completed her self inventory and reported that her depression and hopelessness are 0/10 and anxiety is 2/10.  She states that her goal for today is "to look for the people and events to be grateful for" and she will achieve her goal by "moment to moment."  Encouraged continued participation in group and unit activities.  Q 15 minute checks maintained for safety.  We will continue to monitor the progress towards her goals.

## 2015-11-26 NOTE — Progress Notes (Signed)
Adult Psychoeducational Group Note  Date:  11/26/2015 Time:  9:21 PM  Group Topic/Focus:  Wrap-Up Group:   The focus of this group is to help patients review their daily goal of treatment and discuss progress on daily workbooks.  Participation Level:  Active  Participation Quality:  Appropriate  Affect:  Appropriate  Cognitive:  Alert  Insight: Appropriate  Engagement in Group:  Engaged  Modes of Intervention:  Discussion  Additional Comments:  Patient stated "Shanon Brow came to see me and he played the guitar for me". Patient also stated her daughter came to visit. On a scale from 1-10, (1=worst, 10=best) patient rated her day as a 10.  Miller Limehouse L Ellwyn Ergle 11/26/2015, 9:21 PM

## 2015-11-26 NOTE — BHH Group Notes (Signed)
[facility] LCSW Group Therapy  11/26/2015 1:59 PM  Type of Therapy: Group Therapy  Participation Level: Active  Participation Quality: Attentive  Affect: Flat  Cognitive: Oriented  Insight: Limited  Engagement in Therapy: Engaged  Modes of Intervention: Discussion and Socialization  Summary of Progress/Problems: Shanon Brow from the [facility] was here to tell his story of recovery and play his guitar. Pt was alert and pleasant. Stayed the entire time and seemed interested in the topic being discussed.  Kara Mead. Marshell Levan 11/26/2015 1:59 PM

## 2015-11-26 NOTE — Progress Notes (Signed)
Recreation Therapy Notes  Animal-Assisted Activity (AAA) Program Checklist/Progress Notes Patient Eligibility Criteria Checklist & Daily Group note for Rec Tx Intervention  Date: 02.07.2017 Time: 2:45pm Location: [address]    AAA/T Program Assumption of Risk Form signed by Patient/ or Parent Legal Guardian yes  Patient is free of allergies or sever asthma yes  Patient reports no fear of animals yes  Patient reports no history of cruelty to animals yes  Patient understands his/her participation is voluntary yes  Patient washes hands before animal contact yes  Patient washes hands after animal contact yes  Behavioral Response: Appropriate  Education: Hand Washing, Appropriate Animal Interaction   Education Outcome: Acknowledges education.   Clinical Observations/Feedback: Patient evaluated for appropriateness to attend session, MD and LRT in agreement patient can attend session. Patient offered opportunity to attend and interact with therapy dog. Patient expressed excitement at chance to attend and signed necessary consent form. Patient attended session, interacting with therapy dog and peers in session appropriately. Patient remained in session for duration, returning to 500 hall at conclusion of session without incident.    Laureen Ochs Bernhardt Riemenschneider, LRT/CTRS        Bethanne Mule L 11/26/2015 10:17 AM

## 2015-11-26 NOTE — Progress Notes (Addendum)
DAR Note: Michelle Levy has been out and visible on the unit.  Interacting appropriately with peers and staff.  She has been noted sitting at the end of the hall with peers talking about coping skills.  She denies any pain or discomfort and appears to be in no physical distress.  She reported that she slept well and feels that she is ready to go home.  She denies any SI/HI or A/V hallucinations.  She completed her self inventory and reports that her depression, hopelessness and anxiety are 0/10.  Her goal for today is "breathing, exercise and stretching" and she hopes to accomplish this goal by "just doing it."  Encouraged continued participation in group and unit activities.  Q 15 minute checks maintained for safety.  We will continue to monitor the progress towards her goals.

## 2015-11-26 NOTE — Progress Notes (Signed)
Recreation Therapy Notes  02.07.2017 Per MD order LRT met with patient to investigate ways to enhance patient tx during admission. Patient approached at med window and agreed to speak with LRT. Patient stated she had a "episode" prior to admission. Patient described this as an incident at her grandson's school involving a school program. Patient reports she was dancing with her grandson's class and attempting to get them involved when an arguement occurred between her and her daughter. Patient additionally reports that prior to "my episode" she experienced two loses, her sister approximately 1 month ago following a CDIF infection and her son-in-law approximately last week from lung cancer. Patient additionally identified the election as a trigger for her, reporting that following the election she has been experiencing "scary images" including a white man, a government symbol and [place]. Patient expressed interest in holistic tx, such as aromatherapy, deep breathing and yoga. LRT to provide resources during admission for use post d/c.   Laureen Ochs Jaleiyah Alas, LRT/CTRS   Tyronica Truxillo L 11/26/2015 10:06 AM

## 2015-11-26 NOTE — BHH Group Notes (Signed)
[facility] LCSW Aftercare Discharge Planning Group Note   11/26/2015 1:36 PM  Participation Quality:  Active   Mood/Affect:  Appropriate  Depression Rating:  denies  Anxiety Rating:  denies  Thoughts of Suicide:  No Will you contract for safety?   NA  Current AVH:  No  Plan for Discharge/Comments:  Pt states that she is feeling better and looking forward to leaving the hospital. CSW asked pt what her plan for sobriety would be upon DC. Pt explained that her drinking was merely situational because of multiple recent stressors in her life and that she knew where to go for help if she ever felt she needed it. Pt will follow up with a therapist and psychiatrist.  Transportation Means: Family will transport   Supports: Family and mental health providers   Georga Kaufmann

## 2015-11-26 NOTE — Progress Notes (Addendum)
D: Pt presents appropriate in affect and pleasant in mood. Pt acknowledges that she stays busy "trying to save the world". Pt is willing to work on focusing on herself. Pt is visible and active within the milieu. Pt denied any SI/HI/AVH. Pt is complaint with her POC.  A: Writer administered scheduled medications to pt, per MD orders. Continued support and availability as needed was extended to this pt. Staff continues to monitor pt with q35min checks.  R: No adverse drug reactions noted. Pt receptive to treatment. Pt remains safe at this time.

## 2015-11-26 NOTE — Progress Notes (Signed)
[facility] MD Progress Note  11/26/2015 2:30 PM Michelle Levy  MRN:  [id number] Subjective:  Pt states " I feel like my medications are working .'    Objective; Patient is 60 year old Caucasian female , divorced , employed , who lives in [place] , who denies a past diagnosis of mental illness . Pt as per initial notes in EHR  was admitted due to bizarre behavior and manic symptoms.  She was found to have rambling speech with flight if ideas, grandiosity and having hallucination.  Patient seen chart reviewed, case discussed with treatment team .   Patient today seen as less pressured, more organized thought process , is less labile than on admission.  Pt with several psychosocial stressors like the recent death of her sister in 10-23-2022 , death of her son's mother in law, relational issues with her boyfriend .  Pt today seen as making use of coping skills , relaxation techniques , trying to cope with her anxiety sx. Per staff - pt is tolerating medications well, continues to need a lot of encouragement and support. Pt with alcohol abuse - is motivated to attend [employer] on [place].     Principal Problem: Bipolar disorder, curr episode mixed, severe, w/o psychotic features ([place])   Diagnosis:   Patient Active Problem List   Diagnosis Date Noted  . Bipolar disorder, curr episode mixed, severe, w/o psychotic features ([place]) [F31.63] 11/24/2015  . Alcohol use disorder, severe, dependence ([place]) [F10.20] 11/24/2015  . Bereavement [Z63.4] 11/24/2015   Total Time spent with patient: 25 minutes  Past Psychiatric History: See history and physical  Past Medical History: denies Family History: Renal problems in sister Family Psychiatric  History:grand son has ADD, sister had bipolar disorder, mother - depression Social History: Pt is divorced, employed , lives by self in [place]. History  Alcohol Use  . 2.4 - 3.0 oz/week  . 4-5 Glasses of wine, 0 Cans of beer, 0 Shots of liquor per week     History   Drug Use No    Social History   Social History  . Marital Status: Married    Spouse Name: N/A  . Number of Children: N/A  . Years of Education: N/A   Social History Main Topics  . Smoking status: Never Smoker   . Smokeless tobacco: Never Used  . Alcohol Use: 2.4 - 3.0 oz/week    4-5 Glasses of wine, 0 Cans of beer, 0 Shots of liquor per week  . Drug Use: No  . Sexual Activity: No   Other Topics Concern  . None   Social History Narrative   Additional Social History:    Pain Medications: n/a Prescriptions: n/a Over the Counter: n/a History of alcohol / drug use?: Yes Longest period of sobriety (when/how long): unknown Negative Consequences of Use: Legal, Personal relationships, Work / School Withdrawal Symptoms: Patient aware of relationship between substance abuse and physical/medical complications Name of Substance 1: alcohol 1 - Age of First Use:  ? 60yo 1 - Amount (size/oz): AMAP 1 - Frequency: QD 1 - Duration: as long as possible                  Sleep: Fair  Appetite:  Fair  Current Medications: Current Facility-Administered Medications  Medication Dose Route Frequency Provider Last Rate Last Dose  . acetaminophen (TYLENOL) tablet 650 mg  650 mg Oral Q6H PRN Benjamine Mola, FNP      . alum & mag hydroxide-simeth (MAALOX/MYLANTA) 200-200-20 MG/5ML  suspension 30 mL  30 mL Oral Q4H PRN Benjamine Mola, FNP      . lamoTRIgine (LAMICTAL) tablet 25 mg  25 mg Oral Q supper Ursula Alert, MD   25 mg at 11/25/15 1724  . LORazepam (ATIVAN) tablet 0.5 mg  0.5 mg Oral Q6H PRN Ursula Alert, MD      . magnesium hydroxide (MILK OF MAGNESIA) suspension 30 mL  30 mL Oral Daily PRN Benjamine Mola, FNP      . multivitamin with minerals tablet 1 tablet  1 tablet Oral Daily Kerrie Buffalo, NP   1 tablet at 11/26/15 0749  . QUEtiapine (SEROQUEL) tablet 50 mg  50 mg Oral QHS Kathlee Nations, MD   50 mg at 11/25/15 2145  . thiamine (VITAMIN B-1) tablet 100 mg  100 mg Oral  Daily Kerrie Buffalo, NP   100 mg at 11/26/15 [id number]    Lab Results:  No results found for this or any previous visit (from the past 48 hour(s)).  Physical Findings: AIMS: Facial and Oral Movements Muscles of Facial Expression: None, normal Lips and Perioral Area: None, normal Jaw: None, normal Tongue: None, normal,Extremity Movements Upper (arms, wrists, hands, fingers): None, normal Lower (legs, knees, ankles, toes): None, normal, Trunk Movements Neck, shoulders, hips: None, normal, Overall Severity Severity of abnormal movements (highest score from questions above): None, normal Incapacitation due to abnormal movements: None, normal Patient's awareness of abnormal movements (rate only patient's report): No Awareness, Dental Status Current problems with teeth and/or dentures?: No Does patient usually wear dentures?: No  CIWA:  CIWA-Ar Total: 0 COWS:  COWS Total Score: 4  Musculoskeletal: Strength & Muscle Tone: within normal limits Gait & Station: normal Patient leans: N/A  Psychiatric Specialty Exam: Review of Systems  Psychiatric/Behavioral: Positive for depression. The patient is nervous/anxious.   All other systems reviewed and are negative.   Blood pressure 125/95, pulse 105, temperature 97.9 F (36.6 C), temperature source Oral, resp. rate 18, height 5\' 2"  (1.575 m), weight 62.596 kg (138 lb), SpO2 100 %.Body mass index is 25.23 kg/(m^2).  General Appearance: Fairly Groomed  Engineer, water::  Good  Speech:  Pressured improving  Volume:  Normal  Mood:  Anxious and Depressed improving  Affect:  Labile  Thought Process:  Circumstantial  Orientation:  Full (Time, Place, and Person)  Thought Content:  Paranoid Ideation and Rumination improving  Suicidal Thoughts:  No  Homicidal Thoughts:  No  Memory:  Immediate;   Fair Recent;   Fair Remote;   Fair  Judgement:  Impaired  Insight:  Lacking  Psychomotor Activity:  Restlessness  Concentration:  Fair  Recall:  [employer] of Knowledge:Fair  Language: Fair  Akathisia:  No  Handed:  Right  AIMS (if indicated):     Assets:  Desire for Improvement Housing  ADL's:  Intact  Cognition: WNL  Sleep:  Number of Hours: 6.75   Treatment Plan Summary:Patient is 61 year old Caucasian female , who has no past hx of mental illness, presents with mood sx, tearfulness . Pt today appears hyperactive , but is  less anxious , less pressured , will continue treatment.   Daily contact with patient to assess and evaluate symptoms and progress in treatment and Medication management  Continue Seroquel 50 mg at bedtime to help paranoia, psychosis and manic symptoms. Continue Lamictal 25 mg po qpm for mood sx. Added Ativan 1 mg po q6h prn as needed for anxiety and withdrawal symptoms. Review blood work results- TSH -  wnl, T4 is elevated - discussed to follow up with out pt provider to repeat labs as needed. EKG for qtc - wnl. Continue to encourage group milieu therapy.   Recreational therapy consult. Restart home medication for her health problems. CSW will work on disposition.Patient is motivated to attend AA meetings on [address]  Omeed Osuna, MD 11/26/2015, 2:30 PM

## 2015-11-27 MED ORDER — ADULT MULTIVITAMIN W/MINERALS CH: 1.0000 | ORAL_TABLET | Freq: Every day | ORAL | Status: DC

## 2015-11-27 MED ORDER — LAMOTRIGINE 25 MG PO TABS: 25.0000 mg | ORAL_TABLET | Freq: Every day | ORAL | Status: DC

## 2015-11-27 MED ORDER — THIAMINE HCL 100 MG PO TABS: 100.0000 mg | ORAL_TABLET | Freq: Every day | ORAL | Status: DC

## 2015-11-27 MED ORDER — QUETIAPINE FUMARATE 50 MG PO TABS
50.0000 mg | ORAL_TABLET | Freq: Every day | ORAL | Status: DC
Start: 2015-11-27 — End: 2015-12-31

## 2015-11-27 NOTE — Progress Notes (Signed)
  [facility] Adult Case Management Discharge Plan :  Will you be returning to the same living situation after discharge:  Yes,  returning home At discharge, do you have transportation home?: Yes,  family will transport Do you have the ability to pay for your medications: Yes,  yes  Release of information consent forms completed and in the chart;  Patient's signature needed at discharge.  Patient to Follow up at: Follow-up Information    Follow up with [facility] On 12/31/2015.   Why:  Initial appointment for medications management w Dr Adele Schilder 8:00 AM on 12/31/15   Contact information:   [place]  [id number] Phone:  [phone] Fax:      Next level of care provider has access to [place]:  Safety Planning and Suicide Prevention discussed: Yes,  yes  Have you used any form of tobacco in the last 30 days? (Cigarettes, Smokeless Tobacco, Cigars, and/or Pipes): No  Has patient been referred to the Quitline?: N/A patient is not a smoker  Patient has been referred for addiction treatment: Pt. refused referral  Georga Kaufmann 11/27/2015, 9:38 AM

## 2015-11-27 NOTE — Tx Team (Signed)
Date: 11/27/2015  Time Reviewed: 9:42 AM   Progress in Treatment: Attending groups: Yes. Participating in groups: Yes. Taking medication as prescribed: Yes. Tolerating medication: Yes. Family/Significant othe contact made:  Patient understands diagnosis: Yes. Discussing patient identified problems/goals with staff: Yes. Medical problems stabilized or resolved: Yes. Denies suicidal/homicidal ideation: Yes. Issues/concerns per patient self-inventory:  Other:  New problem(s) identified:   Discharge Plan or Barriers: see below  Reason for Continuation of Hospitalization:   Comments: Patient reports disorganized mind, distressed by inability to determine "what is real and what is not." Attributes current state to drinking 3 glasses of wine. No prior mental health history. Concerns about medication as sister died recently from side effected from unknown medications. Concerned about insurance coverage for medications and outpatient care.   Estimated length of stay: Pt discharging today  New goal(s): Treatment of psychosis sx  Additional Comments: Patient and CSW reviewed pt's identified goals and treatment plan. Patient verbalized understanding and agreed to treatment plan. CSW reviewed [facility] "Discharge Process and Patient Involvement" Form. Pt verbalized understanding of information provided and signed form.    Review of initial/current patient goals per problem list:  1. Goal(s): Patient will participate in aftercare plan  Met: Yes  Target date: at discharge  As evidenced by: Patient will participate within aftercare plan AEB aftercare provider and housing plan at discharge being identified.  2/6: Fraser Din has appt w [facility]. Goal met.  2. Goal (s): Patient will exhibit decreased signs of psychosis.  Met: No  Target date: at discharge As evidenced by: Patient will display reduced signs/sx of psychosis and/or return to  baseline. 2/6: Patient states that her mind is "chaotic"; however, she does feel medication regimen is helping her to tell difference between "what is real and what is fantasy." "I want to stay until my mind is right." Goal progressing.  11/27/15: Pt presents as appropriate in affect in mood. Pt denies all sx. Goal met.  Attendees: Patient:  11/24/2015 4:59 PM   Family:  11/24/2015 4:59 PM   Physician: Ursula Alert, MD 11/24/2015 4:59 PM   Nursing: Phillis Haggis, RN; Ronnie RN 11/24/2015 4:59 PM   Clinical Social Worker: Edwyna Shell, LCSW 11/24/2015 4:59 PM   Clinical Social Worker:  11/24/2015 4:59 PM   Other: Norberto Sorenson, [place] 11/24/2015 4:59 PM    11/24/2015 4:59 PM   Other:  11/24/2015 4:59 PM   Other:  11/24/2015 4:59 PM   Other:  11/24/2015 4:59 PM   Other:  11/24/2015 4:59 PM    11/24/2015 4:59 PM    11/24/2015 4:59 PM    11/24/2015 4:59 PM    11/24/2015 4:59 PM    Scribe for Treatment Team:  Matthew Saras, MSW Intern

## 2015-11-27 NOTE — BHH Suicide Risk Assessment (Signed)
[facility] [place] Discharge Suicide Risk Assessment   Principal Problem: Bipolar disorder, curr episode mixed, severe, w/o psychotic features [facility]) Discharge Diagnoses:  Patient Active Problem List   Diagnosis Date Noted  . Bipolar disorder, curr episode mixed, severe, w/o psychotic features ([place]) [F31.63] 11/24/2015  . Alcohol use disorder, severe, dependence ([place]) [F10.20] 11/24/2015  . Bereavement [Z63.4] 11/24/2015    Total Time spent with patient: 30 minutes  Musculoskeletal: Strength & Muscle Tone: within normal limits Gait & Station: normal Patient leans: N/A  Psychiatric Specialty Exam: Review of Systems  Psychiatric/Behavioral: Positive for substance abuse. Negative for depression, suicidal ideas and hallucinations. The patient is not nervous/anxious.   All other systems reviewed and are negative.   Blood pressure 97/70, pulse 83, temperature 98.3 F (36.8 C), temperature source Oral, resp. rate 16, height 5\' 2"  (1.575 m), weight 62.596 kg (138 lb), SpO2 100 %.Body mass index is 25.23 kg/(m^2).  General Appearance: Casual  Eye Contact::  Fair  Speech:  Clear and [id number]  Volume:  Normal  Mood:  Euthymic  Affect:  Appropriate  Thought Process:  Coherent  Orientation:  Full (Time, Place, and Person)  Thought Content:  WDL  Suicidal Thoughts:  No  Homicidal Thoughts:  No  Memory:  Immediate;   Fair Recent;   Fair Remote;   Fair  Judgement:  Fair  Insight:  Fair  Psychomotor Activity:  Normal  Concentration:  Fair  Recall:  [employer] of Knowledge:Fair  Language: Fair  Akathisia:  No  Handed:  Right  AIMS (if indicated):   0  Assets:  Desire for Improvement  Sleep:  Number of Hours: 6.75  Cognition: WNL  ADL's:  Intact   Mental Status Per Nursing Assessment::   On Admission:     Demographic Factors:  Caucasian  Loss Factors: NA  Historical Factors: Impulsivity  Risk Reduction Factors:   Employed and Positive social support  Continued Clinical Symptoms:   Alcohol/Substance Abuse/Dependencies  Cognitive Features That Contribute To Risk:  None    Suicide Risk:  Minimal: No identifiable suicidal ideation.  Patients presenting with no risk factors but with morbid ruminations; may be classified as minimal risk based on the severity of the depressive symptoms  Follow-up Information    Follow up with [facility] On 12/31/2015.   Why:  Initial appointment for medications management w Dr Adele Schilder 8:00 AM on 12/31/15   Contact information:   [place]  [address] Phone:  [phone] Fax:      Plan Of Care/Follow-up recommendations:  Activity:  no restrictions Diet:  regular Tests:  as needed Other:  follow up with after care  Philipe Laswell, MD 11/27/2015, 9:34 AM

## 2015-11-27 NOTE — BHH Group Notes (Signed)
[place] Group Notes:  (Nursing/MHT/Case Management/Adjunct)  Date:  11/27/2015    Time:  0930 Type of Therapy:  Nurse Education  Participation Level:  Active  Participation Quality:  Appropriate and Attentive  Affect:  Appropriate  Cognitive:  Alert and Appropriate  Insight:  Appropriate and Good  Engagement in Group:  Engaged  Modes of Intervention:  Activity, Discussion, Education and Exploration  Summary of Progress/Problems: Topic was on leisure and lifestyle changes. Discussed the importance of choosing a healthy leisure activities. Group encouraged to surround themselves with positive and healthy group/support system when changing to a healthy lifestyle. Patient was receptive and contributed. Patient states, "I want to be patient when dealing with people."Orlando Penner 11/27/2015, 12:31 PM

## 2015-11-27 NOTE — Progress Notes (Addendum)
D: Pt presents appropriate in affect and pleasant in mood. Pt reports readiness for discharge. Pt was visible and active within the milieu. Pt denied any SI/HI/AVH. Pt reports a plan to attend outpatient therapy.  A: Writer administered scheduled medications to pt, per MD orders. Continued support and availability as needed was extended to this pt. Staff continues to monitor pt with q53min checks.  R: No adverse drug reactions noted. Pt receptive to treatment. Pt remains safe at this time.

## 2015-11-27 NOTE — Progress Notes (Signed)
Patient discharged home with prescriptions. Patient was stable and appreciative at that time. All papers and prescriptions were given and valuables returned. Verbal understanding expressed. Denies SI/HI and A/VH. Patient given opportunity to express concerns and ask questions.

## 2015-11-27 NOTE — Discharge Summary (Signed)
Physician Discharge Summary Note  Patient:  Michelle Levy is an 60 y.o., female MRN:  [id number] DOB:  1955-11-24 Patient phone:  [phone] (home)  Patient address:   [address] [address],  Total Time spent with patient: 30 minutes  Date of Admission:  11/21/2015 Date of Discharge: 11/27/2015  Reason for Admission:  Bizarre and erratic behavior  Principal Problem: Bipolar disorder, curr episode mixed, severe, w/o psychotic features [facility]) Discharge Diagnoses: Patient Active Problem List   Diagnosis Date Noted  . Bipolar disorder, curr episode mixed, severe, w/o psychotic features ([place]) [F31.63] 11/24/2015  . Alcohol use disorder, severe, dependence ([place]) [F10.20] 11/24/2015  . Bereavement [Z63.4] 11/24/2015    Past Psychiatric History:  See above noted  Past Medical History: History reviewed. No pertinent past medical history. History reviewed. No pertinent past surgical history. Family History: History reviewed. No pertinent family history. Family Psychiatric  History:  Denied Social History:  History  Alcohol Use  . 2.4 - 3.0 oz/week  . 4-5 Glasses of wine, 0 Cans of beer, 0 Shots of liquor per week     History  Drug Use No    Social History   Social History  . Marital Status: Married    Spouse Name: N/A  . Number of Children: N/A  . Years of Education: N/A   Social History Main Topics  . Smoking status: Never Smoker   . Smokeless tobacco: Never Used  . Alcohol Use: 2.4 - 3.0 oz/week    4-5 Glasses of wine, 0 Cans of beer, 0 Shots of liquor per week  . Drug Use: No  . Sexual Activity: No   Other Topics Concern  . None   Social History Narrative    Hospital Course:  Michelle Levy, 60 y.o. female initially presented to [place] accompanied by her family after Michelle exhibited worsening psychotic behaviors and mania, including rambling with flight of ideas, hallucinations, and bizarre, erratic behavior.  Michelle Levy was admitted for Bipolar  disorder, curr episode mixed, severe, w/o psychotic features ([place]) and crisis management.  Michelle Levy with the following medications and tolerated well:  Seroquel 50 mg at bedtime to help paranoia, psychosis and manic symptoms,  Lamictal 25 mg po qpm for mood sx, made Ativan 1 mg available po q6h prn as needed for anxiety and withdrawal symptoms.  Michelle Levy was discharged with current medication and was instructed on how to take medications as prescribed; (details listed below under Medication List).  Medical problems were identified and Levy as needed.  Home medications were restarted as appropriate.  Improvement was monitored by observation and [name] daily report of symptom reduction.  Emotional and mental status was monitored by daily self-inventory reports completed by [name] and clinical staff.  We encouraged patient to participate in group milieu therapy.  Patient did want to attend AA meetings.  Additionally, we ordered a recreational therapy consult.  Patient improved and became less pressured, more organized thought process and became less labile compared to admission.      Michelle Levy was evaluated by the treatment team for stability and plans for continued recovery upon discharge.  Michelle Levy motivation was an integral factor for scheduling further treatment.  Employment, transportation, bed availability, health status, family support, and any pending legal issues were also considered during her hospital stay.  Michelle was offered further treatment options upon discharge including but not limited to Residential, Intensive Outpatient, and Outpatient treatment.  Michelle Levy will follow up with the services as listed below under Follow Up Information.     Upon completion of this admission the Michelle Levy was both mentally and medically stable for discharge denying suicidal/homicidal ideation, auditory/visual/tactile hallucinations, delusional thoughts and  paranoia.     Physical Findings: AIMS: Facial and Oral Movements Muscles of Facial Expression: None, normal Lips and Perioral Area: None, normal Jaw: None, normal Tongue: None, normal,Extremity Movements Upper (arms, wrists, hands, fingers): None, normal Lower (legs, knees, ankles, toes): None, normal, Trunk Movements Neck, shoulders, hips: None, normal, Overall Severity Severity of abnormal movements (highest score from questions above): None, normal Incapacitation due to abnormal movements: None, normal Patient's awareness of abnormal movements (rate only patient's report): No Awareness, Dental Status Current problems with teeth and/or dentures?: No Does patient usually wear dentures?: No  CIWA:  CIWA-Ar Total: 0 COWS:  COWS Total Score: 4  Musculoskeletal: Strength & Muscle Tone: within normal limits Gait & Station: normal Patient leans: N/A  Psychiatric Specialty Exam:  SEE MD SRA Review of Systems  All other systems reviewed and are negative.   Blood pressure 124/76, pulse 77, temperature 98.3 F (36.8 C), temperature source Oral, resp. rate 16, height 5\' 2"  (1.575 m), weight 62.596 kg (138 lb), SpO2 100 %.Body mass index is 25.23 kg/(m^2).  Have you used any form of tobacco in the last 30 days? (Cigarettes, Smokeless Tobacco, Cigars, and/or Pipes): No  Has this patient used any form of tobacco in the last 30 days? (Cigarettes, Smokeless Tobacco, Cigars, and/or Pipes) Yes, NA  Metabolic Disorder Labs:  Lab Results  Component Value Date   HGBA1C 5.4 11/22/2015   MPG 108 11/22/2015   Lab Results  Component Value Date   PROLACTIN 14.9 11/22/2015   Lab Results  Component Value Date   CHOL 229* 11/22/2015   TRIG 95 11/22/2015   HDL 109 11/22/2015   CHOLHDL 2.1 11/22/2015   VLDL 19 11/22/2015   LDLCALC 101* 11/22/2015    See Psychiatric Specialty Exam and Suicide Risk Assessment completed by Attending Physician prior to discharge.  Discharge destination:   Home  Is patient on multiple antipsychotic therapies at discharge:  No   Has Patient had three or more failed trials of antipsychotic monotherapy by history:  No  Recommended Plan for Multiple Antipsychotic Therapies: NA     Medication List    STOP taking these medications        CALCIUM PO     MAGNESIUM PO     MULTIVITAMIN & MINERAL PO     VITAMIN B 12 PO     VITAMIN C PO     VITAMIN D PO      TAKE these medications      Indication   lamoTRIgine 25 MG tablet  Commonly known as:  LAMICTAL  Take 1 tablet (25 mg total) by mouth daily with supper.   Indication:  mood stabilization     multivitamin with minerals Tabs tablet  Take 1 tablet by mouth daily.   Indication:  supplementation     QUEtiapine 50 MG tablet  Commonly known as:  SEROQUEL  Take 1 tablet (50 mg total) by mouth at bedtime.   Indication:  mood stabilization     thiamine 100 MG tablet  Take 1 tablet (100 mg total) by mouth daily.   Indication:  Deficiency in Thiamine or Vitamin B1           Follow-up Information    Follow up  with [facility] On 12/01/2015.   Why:  Arrive at 9:00 Monday for your first day of group.  Bring along your completed paperwork.   Contact information:   [place]  [address] Phone:  [phone] Fax:      Follow-up recommendations:  Activity:  as tol Diet:  as tol  Comments:  1.  Take all your medications as prescribed.              2.  Report any adverse side effects to outpatient provider.  Review blood work results with, TSH - wnl, T4 is elevated - discussed to follow up with out pt provider to repeat labs as needed. EKG for qtc - wnl.                       3.  Patient instructed to not use alcohol or illegal drugs while on prescription medicines.            4.  In the event of worsening symptoms, instructed patient to call 911, the crisis hotline or go to nearest emergency room for evaluation of symptoms.  Signed: Janett Labella, NP [facility] 11/27/2015, 2:19 PM

## 2015-12-16 ENCOUNTER — Other Ambulatory Visit (HOSPITAL_COMMUNITY): Payer: Self-pay | Admitting: Psychiatry

## 2015-12-16 ENCOUNTER — Telehealth (HOSPITAL_COMMUNITY): Payer: Self-pay

## 2015-12-16 NOTE — Telephone Encounter (Signed)
Patient is calling to see if she can get a refill of Lamictal. She was inpatient earlier this month and has an appointment set up with you on 3/15 new patient appt. She only has about a week worth of the Lamictal left and could not get in sooner. Please review and advise, thank you.

## 2015-12-17 NOTE — Telephone Encounter (Signed)
I spoke with Dr. Adele Schilder and he is not comfortable refilling without having seen the patient. I called the patient and gave her the number to the inpatient part of the hospital and the name of the NP that wrote the original rx. I told her to call up there and explain the situation and see if they can refill for 1 month or even 2 weeks until she comes in to see Dr. Adele Schilder. Patient voiced her understanding and will call me back if she has any problems.

## 2015-12-18 ENCOUNTER — Telehealth (HOSPITAL_COMMUNITY): Payer: Self-pay | Admitting: Psychiatry

## 2015-12-18 DIAGNOSIS — F3163 Bipolar disorder, current episode mixed, severe, without psychotic features: Secondary | ICD-10-CM

## 2015-12-18 MED ORDER — LAMOTRIGINE 25 MG PO TABS: 25.0000 mg | ORAL_TABLET | Freq: Every day | ORAL | Status: DC

## 2015-12-18 NOTE — Telephone Encounter (Signed)
Order placed per Dr. Lovena Le for patient's Lamictal 25 mg, one a day, #30 with no refills to patient's [facility] [employer] on [address]

## 2015-12-18 NOTE — Telephone Encounter (Signed)
D:  Pt phoned and stated that she needs a refill on Lamictal 25 mg daily.  Pt doesn't see Dr. Adele Schilder as a new pt until 12-31-15.  A:  On Dr. Tanna Furry behalf, placed call to [facility]  [phone], spoke to Vivien Rota Gerald Stabs is pharmacist).  Called in Lamictal 25 mg #30, no refills, to be taken once daily.  Informed pt and Dr. Lovena Le.  R:  Pt receptive.

## 2015-12-31 ENCOUNTER — Ambulatory Visit (INDEPENDENT_AMBULATORY_CARE_PROVIDER_SITE_OTHER): Payer: BLUE CROSS/BLUE SHIELD | Admitting: Psychiatry

## 2015-12-31 ENCOUNTER — Encounter (HOSPITAL_COMMUNITY): Payer: Self-pay | Admitting: Psychiatry

## 2015-12-31 ENCOUNTER — Encounter (INDEPENDENT_AMBULATORY_CARE_PROVIDER_SITE_OTHER): Payer: Self-pay

## 2015-12-31 VITALS — BP 112/74 | HR 66 | Ht 63.0 in | Wt 119.6 lb

## 2015-12-31 DIAGNOSIS — Z79899 Other long term (current) drug therapy: Secondary | ICD-10-CM | POA: Diagnosis not present

## 2015-12-31 DIAGNOSIS — F3163 Bipolar disorder, current episode mixed, severe, without psychotic features: Secondary | ICD-10-CM | POA: Diagnosis not present

## 2015-12-31 DIAGNOSIS — F1010 Alcohol abuse, uncomplicated: Secondary | ICD-10-CM | POA: Diagnosis not present

## 2015-12-31 MED ORDER — QUETIAPINE FUMARATE 25 MG PO TABS: 25.0000 mg | ORAL_TABLET | Freq: Every day | ORAL | Status: DC

## 2015-12-31 MED ORDER — LAMOTRIGINE 25 MG PO TABS: 50.0000 mg | ORAL_TABLET | Freq: Every day | ORAL | Status: DC

## 2015-12-31 NOTE — Progress Notes (Signed)
[facility] Initial Assessment Note  Michelle Levy [id number] 60 y.o.  12/31/2015 11:00 AM  Chief Complaint:  I was admitted to behavioral [place] because of nervous breakdown.  I'm doing better.  I cut down Seroquel because it was making me very sleepy.  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  Patient was admitted from February 3 -February 9 at [facility] due to bizarre, manic and erratic behavior.  She was found to be grandiose and having flight if ideas with rambling speech mania and paranoid behavior.  Patient told at that time she was going through a lot.  Her sister died in 11/16/22 due to prolonged health problems and 2 weeks later her son's mother-in-law died due to cancer.  Soon after that she went to [place] to participate in woman's March and had not slept very well .  When she returned she was very busy trying to help fund raising her grandson and until that time she was very exhausted .  Upon admission her blood alcohol was 159 and she admitted relapsed into drinking to cope with her stress.  She is also not happy with election results and very upset about outcome .  She even sent a letter to [facility] in summer and offer her help but she never got any Klonopin.  Patient was medicated with Seroquel 50 mg and Lamictal 25 mg .  She was also given Ativan for agitation.  Upon release from the hospital she cut down her Seroquel because it was making her very groggy and sleepy.  She is only taking 25 mg .  She sleeping better.  She denies any paranoia, mania , psychosis or any bizarre behavior discharge from the hospital.  Patient admitted that she had a history of mood up and down but usually she knows her symptoms very well and able to control the symptoms on her own.  She remember 2 years ago she had very erratic behavior and she developed  relationship however after some time it was ended when she find out that man cheated on her.  She admitted history of poor impulse control, highs and lows and excessive talking with increase bursts of energy , lack of sleep poor attention and concentration.  However she had never taken any medication until she was admitted to behavioral [place].  Patient denies any tremors, shakes, EPS.  She lives by herself.  She was very attached to her sister who died in November 16, 2022 due to health complications.  Patient told her sister was getting dialysis and she developed infection.  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  Since release from the hospital she is not drinking alcohol.  She scheduled to see therapist on Friday .  Patient denies any suicidal thoughts or homicidal thought.  She feels her thinking is getting back to normal .  She sleeping good.  She denies any feeling of hopelessness or worthlessness.  She is not engaged in any poor impulse control or any impulsive behavior.  Patient denies illegal substance use.  She denies any PTSD symptoms, OCD symptoms, panic attack, aggressive behavior, psychosis or any manic symptoms at this time.    Suicidal Ideation: No Plan Formed: No Patient has means to carry out plan: No  Homicidal Ideation: No Plan Formed: No  Patient has means to carry out plan: No  Past Psychiatric History/Hospitalization(s): Patient endorse history of highs and lows and poor impulse control but she never seen psychiatrist or Admitted until recently require hospitalization in February 2017.  She was found to be grandiose, manic, erratic, labile with flight if ideas and delusions.  Patient denies any history of suicidal attempt Anxiety: No Bipolar Disorder: Symptoms of highs and lows, poor impulse control. Depression: No Mania: See above Psychosis: No Schizophrenia:  No Personality Disorder: No Hospitalization for psychiatric illness: Yes History of Electroconvulsive Shock Therapy: No Prior Suicide Attempts: No  Family History; Patient has strong family history of bipolar disorder.  Her mother has nervous breakdown and requires hospitalization.  Her sister has bipolar disorder and her other sister has depression.  Her father suffers from alcohol addiction.  Medical History; Patient see Dr. Theadore Nan at [facility].  She denies any history of traumatic brain injury , seizures .  Upon admission her T4 was slightly increase and her sodium level was abnormal.  Traumatic brain injury: Patient denies any history of traumatic brain injury.  Education and Work History; Patient has high school education.  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   Psychosocial History; Patient born in [place] and moved to [place] in 1968 .  Her mother is alive and father is deceased.  She married once for 41 years and it was ended in 2014 because husband became severely alcoholic and she could not live with him anymore.  Patient has one daughter and one son.  She is very close to her grandchild.  Currently patient is living alone and her ex-husband works in her restaurant.  Patient has good contact with him.  Patient was involved in a relationship 2 years ago but it was ended when she find out that person is cheating on her.  Legal History; Patient denies any legal issues.  History Of Abuse; Patient denies any history of physical or sexual abuse.  Substance Abuse History; Patient admitted history of drinking since age 60.  She admitted drinking on and off but never felt that it is a problem.  Her blood alcohol was 159 when she was admitted to behavioral [facility].  Patient denies any illegal substance use.  She claims to be sober since she left the hospital.  Patient denies any history of DUIs, tremors, shakes or any seizures.  Review of Systems   Skin: Negative for itching and rash.  Neurological: Negative for tremors and headaches.    Psychiatric: Agitation: No Hallucination: No Depressed Mood: No Insomnia: No Hypersomnia: No Altered Concentration: No Feels Worthless: No Grandiose Ideas: No Belief In Special Powers: No New/Increased Substance Abuse: No Compulsions: No  Neurologic: Headache: No Seizure: No Paresthesias: No   Outpatient Encounter Prescriptions as of 12/31/2015  Medication Sig  . b complex vitamins tablet Take 1 tablet by mouth daily.  . calcium acetate (PHOSLO) 667 MG capsule Take by mouth 3 (three) times daily with meals.  . cholecalciferol (VITAMIN D) 1000 units tablet Take 1,000 Units by mouth daily.  Marland Kitchen lamoTRIgine (LAMICTAL) 25 MG tablet Take 2 tablets (50 mg total) by mouth daily with supper.  . magnesium 30 MG tablet Take 30 mg by mouth 2 (two) times daily. Patient takes '150mg'$ s  . Multiple Vitamin (MULTIVITAMIN WITH MINERALS) TABS tablet Take 1 tablet by mouth daily.  Marland Kitchen omega-3 acid ethyl esters (LOVAZA) 1 g capsule Take 1,000 mg by mouth 2 (two) times daily.  . QUEtiapine (SEROQUEL) 25  MG tablet Take 1 tablet (25 mg total) by mouth at bedtime.  . thiamine (VITAMIN B-1) 100 MG tablet Take 100 mg by mouth daily.  Merril Abbe 10 MCG TABS vaginal tablet INSERT 1 TABLET VAGINALLY DAILY FOR 3 WEEKS THEN 1 TABLET TWICE WEEKLY.  . [DISCONTINUED] lamoTRIgine (LAMICTAL) 25 MG tablet Take 1 tablet (25 mg total) by mouth daily with supper.  . [DISCONTINUED] QUEtiapine (SEROQUEL) 50 MG tablet Take 1 tablet (50 mg total) by mouth at bedtime.  . [DISCONTINUED] thiamine 100 MG tablet Take 1 tablet (100 mg total) by mouth daily.   No facility-administered encounter medications on file as of 12/31/2015.    Recent Results (from the past 2160 hour(s))  Urinalysis, Routine w reflex microscopic (not at [facility])     Status: Abnormal   Collection Time: 11/20/15  8:59 PM  Result Value Ref Range   Color, Urine YELLOW  YELLOW   APPearance CLEAR CLEAR   Specific Gravity, Urine 1.015 1.005 - 1.030   pH 5.5 5.0 - 8.0   Glucose, UA NEGATIVE NEGATIVE mg/dL   Hgb urine dipstick SMALL (A) NEGATIVE   Bilirubin Urine NEGATIVE NEGATIVE   Ketones, ur NEGATIVE NEGATIVE mg/dL   Protein, ur 30 (A) NEGATIVE mg/dL   Nitrite NEGATIVE NEGATIVE   Leukocytes, UA NEGATIVE NEGATIVE  Urine rapid drug screen (hosp performed)     Status: None   Collection Time: 11/20/15  8:59 PM  Result Value Ref Range   Opiates NONE DETECTED NONE DETECTED   Cocaine NONE DETECTED NONE DETECTED   Benzodiazepines NONE DETECTED NONE DETECTED   Amphetamines NONE DETECTED NONE DETECTED   Tetrahydrocannabinol NONE DETECTED NONE DETECTED   Barbiturates NONE DETECTED NONE DETECTED    Comment:        DRUG SCREEN FOR MEDICAL PURPOSES ONLY.  IF CONFIRMATION IS NEEDED FOR ANY PURPOSE, NOTIFY LAB WITHIN 5 DAYS.        LOWEST DETECTABLE LIMITS FOR URINE DRUG SCREEN Drug Class       Cutoff (ng/mL) Amphetamine      1000 Barbiturate      200 Benzodiazepine   989 Tricyclics       211 Opiates          300 Cocaine          300 THC              50   Urine microscopic-add on     Status: Abnormal   Collection Time: 11/20/15  8:59 PM  Result Value Ref Range   Squamous Epithelial / LPF 0-5 (A) NONE SEEN   WBC, UA 0-5 0 - 5 WBC/hpf   RBC / HPF 0-5 0 - 5 RBC/hpf   Bacteria, UA RARE (A) NONE SEEN  Comprehensive metabolic panel     Status: Abnormal   Collection Time: 11/20/15  9:23 PM  Result Value Ref Range   Sodium 132 (L) 135 - 145 mmol/L   Potassium 4.0 3.5 - 5.1 mmol/L   Chloride 98 (L) 101 - 111 mmol/L   CO2 19 (L) 22 - 32 mmol/L   Glucose, Bld 104 (H) 65 - 99 mg/dL   BUN 22 (H) 6 - 20 mg/dL   Creatinine, Ser 0.85 0.44 - 1.00 mg/dL   Calcium 9.1 8.9 - 10.3 mg/dL   Total Protein 7.4 6.5 - 8.1 g/dL   Albumin 4.6 3.5 - 5.0 g/dL   AST 44 (H) 15 - 41 U/L   ALT 30 14 - 54 U/L  Alkaline Phosphatase 76 38 - 126 U/L   Total Bilirubin 0.7 0.3 -  1.2 mg/dL   GFR calc non Af Amer >60 >60 mL/min   GFR calc Af Amer >60 >60 mL/min    Comment: (NOTE) The eGFR has been calculated using the CKD EPI equation. This calculation has not been validated in all clinical situations. eGFR's persistently <60 mL/min signify possible Chronic Kidney Disease.    Anion gap 15 5 - 15  CBC     Status: None   Collection Time: 11/20/15  9:23 PM  Result Value Ref Range   WBC 10.5 4.0 - 10.5 K/uL   RBC 4.53 3.87 - 5.11 MIL/uL   Hemoglobin 14.1 12.0 - 15.0 g/dL   HCT 40.6 36.0 - 46.0 %   MCV 89.6 78.0 - 100.0 fL   MCH 31.1 26.0 - 34.0 pg   MCHC 34.7 30.0 - 36.0 g/dL   RDW 12.4 11.5 - 15.5 %   Platelets 305 150 - 400 K/uL  Ethanol     Status: Abnormal   Collection Time: 11/20/15  9:24 PM  Result Value Ref Range   Alcohol, Ethyl (B) 159 (H) <5 mg/dL    Comment:        LOWEST DETECTABLE LIMIT FOR SERUM ALCOHOL IS 5 mg/dL FOR MEDICAL PURPOSES ONLY   Hemoglobin A1c     Status: None   Collection Time: 11/22/15  6:30 AM  Result Value Ref Range   Hgb A1c MFr Bld 5.4 4.8 - 5.6 %    Comment: (NOTE)         Pre-diabetes: 5.7 - 6.4         Diabetes: >6.4         Glycemic control for adults with diabetes: <7.0    Mean Plasma Glucose 108 mg/dL    Comment: (NOTE) Performed At: [facility] [place], [place] [id number] Lindon Romp MD [id number] Performed at [facility]   Lipid panel     Status: Abnormal   Collection Time: 11/22/15  6:30 AM  Result Value Ref Range   Cholesterol 229 (H) 0 - 200 mg/dL   Triglycerides 95 <150 mg/dL   HDL 109 >40 mg/dL   Total CHOL/HDL Ratio 2.1 RATIO   VLDL 19 0 - 40 mg/dL   LDL Cholesterol 101 (H) 0 - 99 mg/dL    Comment:        Total Cholesterol/HDL:CHD Risk Coronary Heart Disease Risk Table                     Men   Women  1/2 Average Risk   3.4   3.3  Average Risk       5.0   4.4  2 X Average Risk   9.6   7.1  3 X Average Risk  23.4   11.0        Use the  calculated Patient Ratio above and the CHD Risk Table to determine the patient's CHD Risk.        ATP III CLASSIFICATION (LDL):  <100     mg/dL   Optimal  100-129  mg/dL   Near or Above                    Optimal  130-159  mg/dL   Borderline  160-189  mg/dL   High  >190     mg/dL   Very High Performed at [facility]  Prolactin     Status: None   Collection Time: 11/22/15  6:30 AM  Result Value Ref Range   Prolactin 14.9 4.8 - 23.3 ng/mL    Comment: (NOTE) Performed At: [facility] [place], [place] [id number] Lindon Romp MD [id number] Performed at [facility]   T4, free     Status: Abnormal   Collection Time: 11/22/15  6:30 AM  Result Value Ref Range   Free T4 1.40 (H) 0.61 - 1.12 ng/dL    Comment: Performed at [facility]  TSH     Status: None   Collection Time: 11/22/15  6:30 AM  Result Value Ref Range   TSH 1.295 0.350 - 4.500 uIU/mL    Comment: Performed at [facility]      Constitutional:  BP 112/74 mmHg  Pulse 66  Ht '5\' 3"'$  (1.6 m)  Wt 119 lb 9.6 oz (54.25 kg)  BMI 21.19 kg/m2   Musculoskeletal: Strength & Muscle Tone: within normal limits Gait & Station: normal Patient leans: N/A  Psychiatric Specialty Exam: General Appearance: Casual  Eye Contact::  Good  Speech:  Normal Rate  Volume:  Normal  Mood:  Euphoric  Affect:  Labile  Thought Process:  Circumstantial  Orientation:  Full (Time, Place, and Person)  Thought Content:  Rumination  Suicidal Thoughts:  No  Homicidal Thoughts:  No  Memory:  Immediate;   Fair Recent;   Fair Remote;   Fair  Judgement:  Fair  Insight:  Fair  Psychomotor Activity:  Increased  Concentration:  Fair  Recall:  [place] of Knowledge:  Good  Language:  Fair  Akathisia:  No  Handed:  Right  AIMS (if indicated):     Assets:  Communication Skills Desire for Improvement Financial Resources/Insurance Housing Physical  Health Social Support  ADL's:  Intact  Cognition:  WNL  Sleep:        New problem, with additional work up planned, Review of Psycho-Social Stressors (1), Review or order clinical lab tests (1), Decision to obtain old records (1), Review and summation of old records (2), Established Problem, Worsening (2), Review of Last Therapy Session (1), Review of Medication Regimen & Side Effects (2) and Review of New Medication or Change in Dosage (2)  Assessment: Axis I: Bipolar disorder type I.  Alcohol abuse.  Axis II: Deferred  Axis III:  History reviewed. No pertinent past medical history.   Plan:  I review her history, psychosocial stressors, current medication, discharge summary and recent blood work results and current medication.  She is taking Seroquel 25 mg only and does not want to take a higher dose due to excessive sedation.  She is also taking Lamictal 25 mg and denies any rash itching or headaches.  Upon admission her T4 was slightly increase and sodium level 132 and BUN 22.  I recommended to repeat lab work.  Patient is still has residual symptoms of mood lability.  I recommended to try 50 mg Lamictal since patient is not interested to take higher dose of Seroquel.  Discussed medication side effects especially metabolic syndrome with Seroquel and Katherina Right syndrome with Lamictal.  Recommended to stop the Lamictal if she develop any rash.  Patient is scheduled to see therapist coming Friday.  Recommended to call us back if she has any question or any concern.  Discuss safety plan that anytime having active suicidal thoughts or homicidal thoughts then she need to call 911 or  go to the local emergency room.  Follow-up in 3 weeks.Time spent 55 minutes.  More than 50% of the time spent in psychoeducation, counseling and coordination of care.     Sharday Michl T., MD 12/31/2015

## 2016-01-01 ENCOUNTER — Ambulatory Visit (HOSPITAL_COMMUNITY): Payer: Self-pay | Admitting: Clinical

## 2016-01-06 LAB — CBC WITH DIFFERENTIAL/PLATELET
Basophils Absolute: 0.1 10*3/uL (ref 0.0–0.1)
Basophils Relative: 1 % (ref 0–1)
EOS PCT: 1 % (ref 0–5)
Eosinophils Absolute: 0.1 10*3/uL (ref 0.0–0.7)
HEMATOCRIT: 37.8 % (ref 36.0–46.0)
HEMOGLOBIN: 12.6 g/dL (ref 12.0–15.0)
LYMPHS ABS: 2.7 10*3/uL (ref 0.7–4.0)
LYMPHS PCT: 37 % (ref 12–46)
MCH: 30.1 pg (ref 26.0–34.0)
MCHC: 33.3 g/dL (ref 30.0–36.0)
MCV: 90.4 fL (ref 78.0–100.0)
MONO ABS: 0.6 10*3/uL (ref 0.1–1.0)
MONOS PCT: 8 % (ref 3–12)
MPV: 9.2 fL (ref 8.6–12.4)
Neutro Abs: 3.9 10*3/uL (ref 1.7–7.7)
Neutrophils Relative %: 53 % (ref 43–77)
Platelets: 285 10*3/uL (ref 150–400)
RBC: 4.18 MIL/uL (ref 3.87–5.11)
RDW: 12.8 % (ref 11.5–15.5)
WBC: 7.4 10*3/uL (ref 4.0–10.5)

## 2016-01-06 LAB — COMPLETE METABOLIC PANEL WITH GFR
ALBUMIN: 4.6 g/dL (ref 3.6–5.1)
ALK PHOS: 70 U/L (ref 33–130)
ALT: 23 U/L (ref 6–29)
AST: 24 U/L (ref 10–35)
BUN: 17 mg/dL (ref 7–25)
CALCIUM: 9.3 mg/dL (ref 8.6–10.4)
CHLORIDE: 101 mmol/L (ref 98–110)
CO2: 29 mmol/L (ref 20–31)
CREATININE: 0.9 mg/dL (ref 0.50–0.99)
GFR, EST AFRICAN AMERICAN: 80 mL/min (ref >=60)
GFR, Est Non African American: 69 mL/min (ref >=60)
Glucose, Bld: 106 mg/dL — ABNORMAL HIGH (ref 65–99)
POTASSIUM: 4.4 mmol/L (ref 3.5–5.3)
Sodium: 138 mmol/L (ref 135–146)
Total Bilirubin: 0.3 mg/dL (ref 0.2–1.2)
Total Protein: 6.7 g/dL (ref 6.1–8.1)

## 2016-01-06 LAB — TSH: TSH: 1.42 m[IU]/L

## 2016-01-20 ENCOUNTER — Ambulatory Visit (HOSPITAL_COMMUNITY): Payer: Self-pay | Admitting: Clinical

## 2016-01-26 ENCOUNTER — Other Ambulatory Visit (HOSPITAL_COMMUNITY): Payer: Self-pay | Admitting: Psychiatry

## 2016-01-27 ENCOUNTER — Ambulatory Visit (INDEPENDENT_AMBULATORY_CARE_PROVIDER_SITE_OTHER): Payer: BLUE CROSS/BLUE SHIELD | Admitting: Psychiatry

## 2016-01-27 ENCOUNTER — Encounter (HOSPITAL_COMMUNITY): Payer: Self-pay | Admitting: Psychiatry

## 2016-01-27 VITALS — BP 110/62 | HR 77 | Ht 63.0 in | Wt 118.2 lb

## 2016-01-27 DIAGNOSIS — F3163 Bipolar disorder, current episode mixed, severe, without psychotic features: Secondary | ICD-10-CM

## 2016-01-27 DIAGNOSIS — F1010 Alcohol abuse, uncomplicated: Secondary | ICD-10-CM

## 2016-01-27 MED ORDER — QUETIAPINE FUMARATE 25 MG PO TABS: 25.0000 mg | ORAL_TABLET | Freq: Every day | ORAL | Status: DC

## 2016-01-27 MED ORDER — LAMOTRIGINE 25 MG PO TABS: 50.0000 mg | ORAL_TABLET | Freq: Every day | ORAL | Status: DC

## 2016-01-27 NOTE — Progress Notes (Addendum)
[facility] [phone] Progress Note   Michelle Levy [id number] 60 y.o.  01/27/2016 11:46 AM  Chief Complaint:  I'm taking my medication.  I have no side effects.    History of Present Illness:  Michelle Levy came for her follow-up appointment.  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  She was admitted in February at [facility] due to bizarre, manic and erratic behavior.  She was found to be grandiose and having flight if ideas with rambling speech mania and paranoid behavior.  Her sister died in 12/14/2022 due to prolonged health problems and 2 weeks later her son's mother-in-law died due to cancer.  She was discharged on Seroquel however she cut down the dose because she was sleeping too much.  I have recommended to increase Lamictal as she was taking 25 mg only.  She is taking 50 mg and Seroquel 25 mg at bedtime.  Recently she visited [place] and upon returning she stuck in [place] due to severe weather and she believes she handled it very well.  She mentioned it was very difficult situation.  At airport her fight was delayed and she was able to come home 3:00 in the morning but patient felt that she handled situation very well.  She denied any mania, irritability, anger or any aggressive behavior.  She was tearful and crying but she was manage her symptoms without any major problem.  She feel low-dose Seroquel is helping her sleep.  She has not involved in any bizarre behavior.  She started looking for online dating but this time she realized that she needed to have boundaries .  Patient remember that she was cheated last time and her trust has been shaken.  Patient has a lot of questions about her prognosis, medication, side effects and efficacy.  Patient has blood work drawn on March 20 and her basic chemistry and TSH is normal.  She is wondering if she can come off from Seroquel in the future.  Patient is not  drinking and she has significantly cut down her drinking.  She is involved in all the non-groups and like to continue these groups in the future.  Patient denies any suicidal thoughts.  Her energy level is good.  She denies any feeling of hopelessness or worthlessness.  She denies any paranoia or any hallucination.  Her appetite is okay.  Her vitals are stable.  Patient lives alone.  Patient is close to her grandparents.  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  Suicidal Ideation: No Plan Formed: No Patient has means to carry out plan: No  Homicidal Ideation: No Plan Formed: No Patient has means to carry out plan: No  Past Psychiatric History/Hospitalization(s): Patient endorse history of highs and lows and poor impulse control but she never seen psychiatrist or Admitted until recently require hospitalization in February 2017.  She was found to be grandiose, manic, erratic, labile with flight if ideas and delusions.  Patient denies any history of suicidal attempt Anxiety: No Bipolar Disorder: Symptoms of highs and lows, poor impulse control. Depression: No Mania: See above Psychosis: No Schizophrenia: No Personality Disorder: No Hospitalization for psychiatric illness: Yes History of Electroconvulsive Shock Therapy: No Prior Suicide Attempts: No  Family History; Patient has strong family history of bipolar disorder.  Her mother has nervous breakdown and requires hospitalization.  Her sister has bipolar disorder and her other sister has depression.  Her father suffers from alcohol addiction.  Medical  History; Patient see Dr. Theadore Nan at [place].  She denies any history of traumatic brain injury , seizures .  Upon admission her T4 was slightly increase and her sodium level was abnormal.  Substance Abuse History; Patient admitted history of drinking since age 54.  She admitted drinking on and off but never felt that it is a problem.  Her blood alcohol was 159 when she  was admitted to behavioral [facility].  Patient denies any illegal substance use.  She claims to be sober since she left the hospital.  Patient denies any history of DUIs, tremors, shakes or any seizures.  Review of Systems  Skin: Negative for itching and rash.  Neurological: Negative for tremors and headaches.  Psychiatric/Behavioral: Negative for depression, suicidal ideas and hallucinations.    Psychiatric: Agitation: No Hallucination: No Depressed Mood: No Insomnia: No Hypersomnia: No Altered Concentration: No Feels Worthless: No Grandiose Ideas: No Belief In Special Powers: No New/Increased Substance Abuse: No Compulsions: No  Neurologic: Headache: No Seizure: No Paresthesias: No   Outpatient Encounter Prescriptions as of 01/27/2016  Medication Sig  . b complex vitamins tablet Take 1 tablet by mouth daily.  . calcium acetate (PHOSLO) 667 MG capsule Take by mouth 3 (three) times daily with meals.  . cholecalciferol (VITAMIN D) 1000 units tablet Take 1,000 Units by mouth daily.  Marland Kitchen lamoTRIgine (LAMICTAL) 25 MG tablet Take 2 tablets (50 mg total) by mouth daily with supper.  . magnesium 30 MG tablet Take 30 mg by mouth 2 (two) times daily. Patient takes 159ms  . Multiple Vitamin (MULTIVITAMIN WITH MINERALS) TABS tablet Take 1 tablet by mouth daily.  .Marland Kitchenomega-3 acid ethyl esters (LOVAZA) 1 g capsule Take 1,000 mg by mouth 2 (two) times daily.  . QUEtiapine (SEROQUEL) 25 MG tablet Take 1 tablet (25 mg total) by mouth at bedtime.  . thiamine (VITAMIN B-1) 100 MG tablet Take 100 mg by mouth daily.  .Merril Abbe10 MCG TABS vaginal tablet INSERT 1 TABLET VAGINALLY DAILY FOR 3 WEEKS THEN 1 TABLET TWICE WEEKLY.  . [DISCONTINUED] lamoTRIgine (LAMICTAL) 25 MG tablet Take 2 tablets (50 mg total) by mouth daily with supper.  . [DISCONTINUED] QUEtiapine (SEROQUEL) 25 MG tablet Take 1 tablet (25 mg total) by mouth at bedtime.   No facility-administered encounter medications on file as of  01/27/2016.    Recent Results (from the past 2160 hour(s))  Urinalysis, [facility]     Status: Abnormal   Collection Time: 11/20/15  8:59 PM  Result Value Ref Range   Color, Urine YELLOW YELLOW   APPearance CLEAR CLEAR   Specific Gravity, Urine 1.015 1.005 - 1.030   pH 5.5 5.0 - 8.0   Glucose, UA NEGATIVE NEGATIVE mg/dL   Hgb urine dipstick SMALL (A) NEGATIVE   Bilirubin Urine NEGATIVE NEGATIVE   Ketones, ur NEGATIVE NEGATIVE mg/dL   Protein, ur 30 (A) NEGATIVE mg/dL   Nitrite NEGATIVE NEGATIVE   Leukocytes, UA NEGATIVE NEGATIVE  Urine rapid drug screen (hosp performed)     Status: None   Collection Time: 11/20/15  8:59 PM  Result Value Ref Range   Opiates NONE DETECTED NONE DETECTED   Cocaine NONE DETECTED NONE DETECTED   Benzodiazepines NONE DETECTED NONE DETECTED   Amphetamines NONE DETECTED NONE DETECTED   Tetrahydrocannabinol NONE DETECTED NONE DETECTED   Barbiturates NONE DETECTED NONE DETECTED    Comment:        DRUG SCREEN FOR MEDICAL PURPOSES ONLY.  IF CONFIRMATION IS NEEDED FOR ANY PURPOSE, NOTIFY LAB WITHIN 5 DAYS.        LOWEST DETECTABLE LIMITS FOR URINE DRUG SCREEN Drug Class       Cutoff (ng/mL) Amphetamine      1000 Barbiturate      200 Benzodiazepine   001 Tricyclics       749 Opiates          300 Cocaine          300 THC              50   Urine microscopic-add on     Status: Abnormal   Collection Time: 11/20/15  8:59 PM  Result Value Ref Range   Squamous Epithelial / LPF 0-5 (A) NONE SEEN   WBC, UA 0-5 0 - 5 WBC/hpf   RBC / HPF 0-5 0 - 5 RBC/hpf   Bacteria, UA RARE (A) NONE SEEN  Comprehensive metabolic panel     Status: Abnormal   Collection Time: 11/20/15  9:23 PM  Result Value Ref Range   Sodium 132 (L) 135 - 145 mmol/L   Potassium 4.0 3.5 - 5.1 mmol/L   Chloride 98 (L) 101 - 111 mmol/L   CO2 19 (L) 22 - 32 mmol/L   Glucose, Bld 104 (H) 65 - 99 mg/dL   BUN 22 (H) 6 - 20 mg/dL   Creatinine, Ser 0.85 0.44 -  1.00 mg/dL   Calcium 9.1 8.9 - 10.3 mg/dL   Total Protein 7.4 6.5 - 8.1 g/dL   Albumin 4.6 3.5 - 5.0 g/dL   AST 44 (H) 15 - 41 U/L   ALT 30 14 - 54 U/L   Alkaline Phosphatase 76 38 - 126 U/L   Total Bilirubin 0.7 0.3 - 1.2 mg/dL   GFR calc non Af Amer >60 >60 mL/min   GFR calc Af Amer >60 >60 mL/min    Comment: (NOTE) The eGFR has been calculated using the CKD EPI equation. This calculation has not been validated in all clinical situations. eGFR's persistently <60 mL/min signify possible Chronic Kidney Disease.    Anion gap 15 5 - 15  CBC     Status: None   Collection Time: 11/20/15  9:23 PM  Result Value Ref Range   WBC 10.5 4.0 - 10.5 K/uL   RBC 4.53 3.87 - 5.11 MIL/uL   Hemoglobin 14.1 12.0 - 15.0 g/dL   HCT 40.6 36.0 - 46.0 %   MCV 89.6 78.0 - 100.0 fL   MCH 31.1 26.0 - 34.0 pg   MCHC 34.7 30.0 - 36.0 g/dL   RDW 12.4 11.5 - 15.5 %   Platelets 305 150 - 400 K/uL  Ethanol     Status: Abnormal   Collection Time: 11/20/15  9:24 PM  Result Value Ref Range   Alcohol, Ethyl (B) 159 (H) <5 mg/dL    Comment:        LOWEST DETECTABLE LIMIT FOR SERUM ALCOHOL IS 5 mg/dL FOR MEDICAL PURPOSES ONLY   Hemoglobin A1c     Status: None   Collection Time: 11/22/15  6:30 AM  Result Value Ref Range   Hgb A1c MFr Bld 5.4 4.8 - 5.6 %    Comment: (NOTE)         Pre-diabetes: 5.7 - 6.4         Diabetes: >6.4         Glycemic control for adults with diabetes: <7.0    Mean Plasma Glucose 108 mg/dL  Comment: (NOTE) Performed At: [facility] [place], [place] [id number] Lindon Romp MD [id number] Performed at [facility]   Lipid panel     Status: Abnormal   Collection Time: 11/22/15  6:30 AM  Result Value Ref Range   Cholesterol 229 (H) 0 - 200 mg/dL   Triglycerides 95 <150 mg/dL   HDL 109 >40 mg/dL   Total CHOL/HDL Ratio 2.1 RATIO   VLDL 19 0 - 40 mg/dL   LDL Cholesterol 101 (H) 0 - 99 mg/dL    Comment:        Total  Cholesterol/HDL:CHD Risk Coronary Heart Disease Risk Table                     Men   Women  1/2 Average Risk   3.4   3.3  Average Risk       5.0   4.4  2 X Average Risk   9.6   7.1  3 X Average Risk  23.4   11.0        Use the calculated Patient Ratio above and the CHD Risk Table to determine the patient's CHD Risk.        ATP III CLASSIFICATION (LDL):  <100     mg/dL   Optimal  100-129  mg/dL   Near or Above                    Optimal  130-159  mg/dL   Borderline  160-189  mg/dL   High  >190     mg/dL   Very High Performed at [facility]   Prolactin     Status: None   Collection Time: 11/22/15  6:30 AM  Result Value Ref Range   Prolactin 14.9 4.8 - 23.3 ng/mL    Comment: (NOTE) Performed At: [facility] [place], [place] [id number] Lindon Romp MD [id number] Performed at [facility]   T4, free     Status: Abnormal   Collection Time: 11/22/15  6:30 AM  Result Value Ref Range   Free T4 1.40 (H) 0.61 - 1.12 ng/dL    Comment: Performed at [facility]  TSH     Status: None   Collection Time: 11/22/15  6:30 AM  Result Value Ref Range   TSH 1.295 0.350 - 4.500 uIU/mL    Comment: Performed at [facility]  CBC with Differential/Platelet     Status: None   Collection Time: 01/05/16  3:14 PM  Result Value Ref Range   WBC 7.4 4.0 - 10.5 K/uL   RBC 4.18 3.87 - 5.11 MIL/uL   Hemoglobin 12.6 12.0 - 15.0 g/dL   HCT 37.8 36.0 - 46.0 %   MCV 90.4 78.0 - 100.0 fL   MCH 30.1 26.0 - 34.0 pg   MCHC 33.3 30.0 - 36.0 g/dL   RDW 12.8 11.5 - 15.5 %   Platelets 285 150 - 400 K/uL   MPV 9.2 8.6 - 12.4 fL   Neutrophils Relative % 53 43 - 77 %   Neutro Abs 3.9 1.7 - 7.7 K/uL   Lymphocytes Relative 37 12 - 46 %   Lymphs Abs 2.7 0.7 - 4.0 K/uL   Monocytes Relative 8 3 - 12 %   Monocytes Absolute 0.6 0.1 - 1.0 K/uL   Eosinophils Relative 1 0 - 5 %   Eosinophils Absolute 0.1 0.0 - 0.7 K/uL  Basophils  Relative 1 0 - 1 %   Basophils Absolute 0.1 0.0 - 0.1 K/uL   Smear Review Criteria for review not met   COMPLETE METABOLIC PANEL WITH GFR     Status: Abnormal   Collection Time: 01/05/16  3:14 PM  Result Value Ref Range   Sodium 138 135 - 146 mmol/L   Potassium 4.4 3.5 - 5.3 mmol/L   Chloride 101 98 - 110 mmol/L   CO2 29 20 - 31 mmol/L   Glucose, Bld 106 (H) 65 - 99 mg/dL   BUN 17 7 - 25 mg/dL   Creat 0.90 0.50 - 0.99 mg/dL   Total Bilirubin 0.3 0.2 - 1.2 mg/dL   Alkaline Phosphatase 70 33 - 130 U/L   AST 24 10 - 35 U/L   ALT 23 6 - 29 U/L   Total Protein 6.7 6.1 - 8.1 g/dL   Albumin 4.6 3.6 - 5.1 g/dL   Calcium 9.3 8.6 - 10.4 mg/dL   GFR, Est African American 80 >=60 mL/min   GFR, Est Non African American 69 >=60 mL/min    Comment:   The estimated GFR is a calculation valid for adults (>=4 years old) that uses the CKD-EPI algorithm to adjust for age and sex. It is   not to be used for children, pregnant women, hospitalized patients,    patients on dialysis, or with rapidly changing kidney function. According to the NKDEP, eGFR >89 is normal, 60-89 shows mild impairment, 30-59 shows moderate impairment, 15-29 shows severe impairment and <15 is ESRD.     TSH     Status: None   Collection Time: 01/05/16  3:14 PM  Result Value Ref Range   TSH 1.42 mIU/L    Comment:   Reference Range   > or = 20 Years  0.40-4.50   Pregnancy Range First trimester  0.26-2.66 Second trimester 0.55-2.73 Third trimester  0.43-2.91         Constitutional:  BP 110/62 mmHg  Pulse 77  Ht 5' 3" (1.6 m)  Wt 118 lb 3.2 oz (53.615 kg)  BMI 20.94 kg/m2   Musculoskeletal: Strength & Muscle Tone: within normal limits Gait & Station: normal Patient leans: N/A  Psychiatric Specialty Exam: General Appearance: Casual  Eye Contact::  Good  Speech:  Normal Rate  Volume:  Normal  Mood:  Euphoric  Affect:  Labile  Thought Process:  Circumstantial  Orientation:  Full (Time, Place, and  Person)  Thought Content:  Rumination  Suicidal Thoughts:  No  Homicidal Thoughts:  No  Memory:  Immediate;   Fair Recent;   Fair Remote;   Fair  Judgement:  Fair  Insight:  Fair  Psychomotor Activity:  Increased  Concentration:  Fair  Recall:  [place] of Knowledge:  Good  Language:  Fair  Akathisia:  No  Handed:  Right  AIMS (if indicated):     Assets:  Communication Skills Desire for Improvement Financial Resources/Insurance Housing Physical Health Social Support  ADL's:  Intact  Cognition:  WNL  Sleep:        Established Problem, Stable/Improving (1), Review of Psycho-Social Stressors (1), Review or order clinical lab tests (1), Review and summation of old records (2), Review of Last Therapy Session (1) and Review of Medication Regimen & Side Effects (2)  Assessment: Axis I: Bipolar disorder type I.  Alcohol abuse.  Axis II: Deferred  Axis III:  History reviewed. No pertinent past medical history.   Plan:  Patient is  stable on Lamictal 50 mg and Seroquel 25 mg at bedtime.  She has no rash, itching, headaches.  She claims sober from drinking since she left the hospital.  Patient has multiple questions about her medication, diagnosis, prognosis long-term effects.  I discussed her concerns and efficacy of the medication.  I reviewed her blood work results and repeat chemistry and TSH is normal.  We will consider stopping Seroquel in the future once patient is more stable on Lamictal.  She is seeing Pattricia Boss for counseling.  Encouraged to continue volunteer work with [employer] .   Recommended to call us back if she has any question or any concern.  Discuss safety plan that anytime having active suicidal thoughts or homicidal thoughts then she need to call 911 or go to the local emergency room.  Follow-up in 8 weeks.Time spent 55 minutes.  More than 50% of the time spent in psychoeducation, counseling and coordination of care.     Tazaria Dlugosz T.,  MD 01/27/2016

## 2016-01-28 DIAGNOSIS — F312 Bipolar disorder, current episode manic severe with psychotic features: Secondary | ICD-10-CM | POA: Diagnosis not present

## 2016-02-13 DIAGNOSIS — F312 Bipolar disorder, current episode manic severe with psychotic features: Secondary | ICD-10-CM | POA: Diagnosis not present

## 2016-02-27 DIAGNOSIS — F312 Bipolar disorder, current episode manic severe with psychotic features: Secondary | ICD-10-CM | POA: Diagnosis not present

## 2016-03-09 DIAGNOSIS — S61012A Laceration without foreign body of left thumb without damage to nail, initial encounter: Secondary | ICD-10-CM | POA: Diagnosis not present

## 2016-03-12 DIAGNOSIS — F312 Bipolar disorder, current episode manic severe with psychotic features: Secondary | ICD-10-CM | POA: Diagnosis not present

## 2016-03-23 DIAGNOSIS — H2513 Age-related nuclear cataract, bilateral: Secondary | ICD-10-CM | POA: Diagnosis not present

## 2016-03-26 DIAGNOSIS — F312 Bipolar disorder, current episode manic severe with psychotic features: Secondary | ICD-10-CM | POA: Diagnosis not present

## 2016-03-30 ENCOUNTER — Ambulatory Visit (HOSPITAL_COMMUNITY): Payer: Self-pay | Admitting: Psychiatry

## 2016-03-31 ENCOUNTER — Ambulatory Visit (INDEPENDENT_AMBULATORY_CARE_PROVIDER_SITE_OTHER): Payer: BLUE CROSS/BLUE SHIELD | Admitting: Psychiatry

## 2016-03-31 ENCOUNTER — Encounter (HOSPITAL_COMMUNITY): Payer: Self-pay | Admitting: Psychiatry

## 2016-03-31 VITALS — BP 102/64 | HR 78 | Ht 63.0 in | Wt 118.8 lb

## 2016-03-31 DIAGNOSIS — F1010 Alcohol abuse, uncomplicated: Secondary | ICD-10-CM | POA: Diagnosis not present

## 2016-03-31 DIAGNOSIS — F3163 Bipolar disorder, current episode mixed, severe, without psychotic features: Secondary | ICD-10-CM | POA: Diagnosis not present

## 2016-03-31 MED ORDER — QUETIAPINE FUMARATE 25 MG PO TABS: 25.0000 mg | ORAL_TABLET | Freq: Every day | ORAL | Status: DC

## 2016-03-31 MED ORDER — LAMOTRIGINE 25 MG PO TABS: 50.0000 mg | ORAL_TABLET | Freq: Every day | ORAL | Status: DC

## 2016-03-31 NOTE — Progress Notes (Signed)
[name] [phone] Progress Note   Michelle Michelle [id number] 60 y.o.  03/31/2016 2:37 PM  Chief Complaint:  I stop taking Seroquel.  It was making him very sleepy and groggy.  I'm sleeping better without Seroquel.      History of Present Illness:  Michelle Michelle came for her follow-up appointment.  She stopped taking Seroquel because she reported it was making her very sleepy and groggy .  She stopped 2 weeks ago and she reported no issues with her insomnia.  She described her mood is stable.  She still questioned why she was diagnosed with bipolar disorder.  She was told about inpatient hospitalization and about her bizarre and erratic behavior.  Patient realizes that she was sick but now she feels there is no need to continue the medication however after some encouragement she agreed to continue Lamictal.  She admitted some time drinking but overall she had cut down from the past.  She continues to enjoy social drinking with her friends.  She is going to [place] next week and than [place] to meet with Personnel officer .  Patient also seeing Pattricia Boss but lately she is trying to wean her from counseling.  Patient denies any irritability, mania, psychosis, hallucination.  Her energy level is good.  She denies any impulsive or any manic episodes.  Her appetite is okay.  Her vitals are stable.  Patient lives alone.  Patient is close to her grandparents.  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  Suicidal Ideation: No Plan Formed: No Patient has means to carry out plan: No  Homicidal Ideation: No Plan Formed: No Patient has means to carry out plan: No  Past Psychiatric History/Hospitalization(s): Patient endorse history of highs and lows and poor impulse control but she never seen psychiatrist or Admitted until recently require hospitalization in February 2017.  She was found to be grandiose, manic, erratic, labile with flight if ideas and delusions.  Patient denies any history of  suicidal attempt Anxiety: No Bipolar Disorder: Symptoms of highs and lows, poor impulse control. Depression: No Mania: See above Psychosis: No Schizophrenia: No Personality Disorder: No Hospitalization for psychiatric illness: Yes History of Electroconvulsive Shock Therapy: No Prior Suicide Attempts: No  Family History; Patient has strong family history of bipolar disorder.  Her mother has nervous breakdown and requires hospitalization.  Her sister has bipolar disorder and her other sister has depression.  Her father suffers from alcohol addiction.  Medical History; Patient see Dr. Theadore Nan at [place].  She denies any history of traumatic brain injury , seizures .    Substance Abuse History; Patient admitted history of drinking since age 25.  She admitted drinking on and off but never felt that it is a problem.  Her blood alcohol was 159 when she was admitted to behavioral [place].  Patient denies any illegal substance use.  She claims to be sober since she left the hospital.  Patient denies any history of DUIs, tremors, shakes or any seizures.  Review of Systems  Skin: Negative for itching and rash.  Neurological: Negative for tremors and headaches.  Psychiatric/Behavioral: Negative for depression, suicidal ideas and hallucinations.    Psychiatric: Agitation: No Hallucination: No Depressed Mood: No Insomnia: No Hypersomnia: No Altered Concentration: No Feels Worthless: No Grandiose Ideas: No Belief In Special Powers: No New/Increased Substance Abuse: No Compulsions: No  Neurologic: Headache: No Seizure: No Paresthesias: No   Outpatient Encounter Prescriptions as of 03/31/2016  Medication Sig  . b  complex vitamins tablet Take 1 tablet by mouth daily.  . calcium acetate (PHOSLO) 667 MG capsule Take by mouth 3 (three) times daily with meals.  . cholecalciferol (VITAMIN D) 1000 units tablet Take 1,000 Units by mouth daily.  Marland Kitchen lamoTRIgine (LAMICTAL)  25 MG tablet Take 2 tablets (50 mg total) by mouth daily with supper.  . magnesium 30 MG tablet Take 30 mg by mouth 2 (two) times daily. Patient takes '150mg'$ s  . Multiple Vitamin (MULTIVITAMIN WITH MINERALS) TABS tablet Take 1 tablet by mouth daily.  Marland Kitchen omega-3 acid ethyl esters (LOVAZA) 1 g capsule Take 1,000 mg by mouth 2 (two) times daily.  . QUEtiapine (SEROQUEL) 25 MG tablet Take 1 tablet (25 mg total) by mouth at bedtime.  . thiamine (VITAMIN B-1) 100 MG tablet Take 100 mg by mouth daily.  Merril Abbe 10 MCG TABS vaginal tablet INSERT 1 TABLET VAGINALLY DAILY FOR 3 WEEKS THEN 1 TABLET TWICE WEEKLY.  . [DISCONTINUED] lamoTRIgine (LAMICTAL) 25 MG tablet Take 2 tablets (50 mg total) by mouth daily with supper.  . [DISCONTINUED] QUEtiapine (SEROQUEL) 25 MG tablet Take 1 tablet (25 mg total) by mouth at bedtime.   No facility-administered encounter medications on file as of 03/31/2016.    Recent Results (from the past 2160 hour(s))  CBC with Differential/Platelet     Status: None   Collection Time: 01/05/16  3:14 PM  Result Value Ref Range   WBC 7.4 4.0 - 10.5 K/uL   RBC 4.18 3.87 - 5.11 MIL/uL   Hemoglobin 12.6 12.0 - 15.0 g/dL   HCT 37.8 36.0 - 46.0 %   MCV 90.4 78.0 - 100.0 fL   MCH 30.1 26.0 - 34.0 pg   MCHC 33.3 30.0 - 36.0 g/dL   RDW 12.8 11.5 - 15.5 %   Platelets 285 150 - 400 K/uL   MPV 9.2 8.6 - 12.4 fL   Neutrophils Relative % 53 43 - 77 %   Neutro Abs 3.9 1.7 - 7.7 K/uL   Lymphocytes Relative 37 12 - 46 %   Lymphs Abs 2.7 0.7 - 4.0 K/uL   Monocytes Relative 8 3 - 12 %   Monocytes Absolute 0.6 0.1 - 1.0 K/uL   Eosinophils Relative 1 0 - 5 %   Eosinophils Absolute 0.1 0.0 - 0.7 K/uL   Basophils Relative 1 0 - 1 %   Basophils Absolute 0.1 0.0 - 0.1 K/uL   Smear Review Criteria for review not met   COMPLETE METABOLIC PANEL WITH GFR     Status: Abnormal   Collection Time: 01/05/16  3:14 PM  Result Value Ref Range   Sodium 138 135 - 146 mmol/L   Potassium 4.4 3.5 - 5.3  mmol/L   Chloride 101 98 - 110 mmol/L   CO2 29 20 - 31 mmol/L   Glucose, Bld 106 (H) 65 - 99 mg/dL   BUN 17 7 - 25 mg/dL   Creat 0.90 0.50 - 0.99 mg/dL   Total Bilirubin 0.3 0.2 - 1.2 mg/dL   Alkaline Phosphatase 70 33 - 130 U/L   AST 24 10 - 35 U/L   ALT 23 6 - 29 U/L   Total Protein 6.7 6.1 - 8.1 g/dL   Albumin 4.6 3.6 - 5.1 g/dL   Calcium 9.3 8.6 - 10.4 mg/dL   GFR, Est African American 80 >=60 mL/min   GFR, Est Non African American 69 >=60 mL/min    Comment:   The estimated GFR is a calculation valid  for adults (>=57 years old) that uses the CKD-EPI algorithm to adjust for age and sex. It is   not to be used for children, pregnant women, hospitalized patients,    patients on dialysis, or with rapidly changing kidney function. According to the NKDEP, eGFR >89 is normal, 60-89 shows mild impairment, 30-59 shows moderate impairment, 15-29 shows severe impairment and <15 is ESRD.     TSH     Status: None   Collection Time: 01/05/16  3:14 PM  Result Value Ref Range   TSH 1.42 mIU/L    Comment:   Reference Range   > or = 20 Years  0.40-4.50   Pregnancy Range First trimester  0.26-2.66 Second trimester 0.55-2.73 Third trimester  0.43-2.91         Constitutional:  BP 102/64 mmHg  Pulse 78  Ht _0  (1.6 m)  Wt 118 lb 12.8 oz (53.887 kg)  BMI 21.05 kg/m2   Musculoskeletal: Strength & Muscle Tone: within normal limits Gait & Station: normal Patient leans: N/A  Psychiatric Specialty Exam: General Appearance: Casual  Eye Contact::  Good  Speech:  Normal Rate  Volume:  Normal  Mood:  Euphoric  Affect:  Labile  Thought Process:  Circumstantial  Orientation:  Full (Time, Place, and Person)  Thought Content:  Rumination  Suicidal Thoughts:  No  Homicidal Thoughts:  No  Memory:  Immediate;   Fair Recent;   Fair Remote;   Fair  Judgement:  Fair  Insight:  Fair  Psychomotor Activity:  Increased  Concentration:  Fair  Recall:  [place] of Knowledge:   Good  Language:  Fair  Akathisia:  No  Handed:  Right  AIMS (if indicated):     Assets:  Communication Skills Desire for Improvement Financial Resources/Insurance Housing Physical Health Social Support  ADL's:  Intact  Cognition:  WNL  Sleep:        Established Problem, Stable/Improving (1), Review of Psycho-Social Stressors (1), Review and summation of old records (2), Review of Last Therapy Session (1), Review of Medication Regimen & Side Effects (2) and Review of New Medication or Change in Dosage (2)  Assessment: Axis I: Bipolar disorder type I.  Alcohol abuse.  Axis II: Deferred  Axis III:  History reviewed. No pertinent past medical history.   Plan:  I review her history, records, diagnosis and current medication.  Patient still have difficulty understanding her bipolar disorder.  She like to stop the Seroquel because of side effects.  She is not interested to try any other medication and I also offered to increase the Lamictal but she refused.  We have a discussion about long-term prognosis and risk of relapse due to noncompliance with medication but patient insists that she wants to stop.  I encouraged to have Seroquel prescription on board just in case if she felt that her illness is coming back.  She like to continue Lamictal 50 mg daily.  We discuss alcohol addiction in detail and is strongly encouraged to stop the drinking as alcohol may contribute to her illness.  Patient agreed with the plan and promised that she will call us if symptoms started to get worse.  I recommended to call us back if symptoms get worse or anytime having active or passive suicidal thoughts or homicidal thought.  Time spent 25 minutes.  More than 50% of the time spent in psychoeducation, counseling, coordination of care and long-term prognosis .  Follow-up in 3 months.   Kylena Mole T., MD  03/31/2016

## 2016-04-14 DIAGNOSIS — F312 Bipolar disorder, current episode manic severe with psychotic features: Secondary | ICD-10-CM | POA: Diagnosis not present

## 2016-04-30 DIAGNOSIS — F312 Bipolar disorder, current episode manic severe with psychotic features: Secondary | ICD-10-CM | POA: Diagnosis not present

## 2016-05-19 DIAGNOSIS — F312 Bipolar disorder, current episode manic severe with psychotic features: Secondary | ICD-10-CM | POA: Diagnosis not present

## 2016-06-09 DIAGNOSIS — D1801 Hemangioma of skin and subcutaneous tissue: Secondary | ICD-10-CM | POA: Diagnosis not present

## 2016-06-09 DIAGNOSIS — D225 Melanocytic nevi of trunk: Secondary | ICD-10-CM | POA: Diagnosis not present

## 2016-06-09 DIAGNOSIS — D2272 Melanocytic nevi of left lower limb, including hip: Secondary | ICD-10-CM | POA: Diagnosis not present

## 2016-06-29 ENCOUNTER — Ambulatory Visit (HOSPITAL_COMMUNITY): Payer: Self-pay | Admitting: Psychiatry

## 2016-06-30 DIAGNOSIS — F312 Bipolar disorder, current episode manic severe with psychotic features: Secondary | ICD-10-CM | POA: Diagnosis not present

## 2016-06-30 DIAGNOSIS — Z23 Encounter for immunization: Secondary | ICD-10-CM | POA: Diagnosis not present

## 2016-06-30 DIAGNOSIS — Z0000 Encounter for general adult medical examination without abnormal findings: Secondary | ICD-10-CM | POA: Diagnosis not present

## 2016-07-29 DIAGNOSIS — F312 Bipolar disorder, current episode manic severe with psychotic features: Secondary | ICD-10-CM | POA: Diagnosis not present

## 2016-08-10 DIAGNOSIS — M8588 Other specified disorders of bone density and structure, other site: Secondary | ICD-10-CM | POA: Diagnosis not present

## 2016-08-10 DIAGNOSIS — E2839 Other primary ovarian failure: Secondary | ICD-10-CM | POA: Diagnosis not present

## 2016-08-24 ENCOUNTER — Ambulatory Visit (INDEPENDENT_AMBULATORY_CARE_PROVIDER_SITE_OTHER): Payer: BLUE CROSS/BLUE SHIELD | Admitting: Psychiatry

## 2016-08-24 ENCOUNTER — Encounter (HOSPITAL_COMMUNITY): Payer: Self-pay | Admitting: Psychiatry

## 2016-08-24 VITALS — BP 110/64 | HR 71 | Ht 63.0 in | Wt 122.8 lb

## 2016-08-24 DIAGNOSIS — F3163 Bipolar disorder, current episode mixed, severe, without psychotic features: Secondary | ICD-10-CM | POA: Diagnosis not present

## 2016-08-24 DIAGNOSIS — Z79899 Other long term (current) drug therapy: Secondary | ICD-10-CM | POA: Diagnosis not present

## 2016-08-24 NOTE — Progress Notes (Signed)
Michelle Progress Note   BETTA Levy [id number] 60 y.o.  08/24/2016 10:56 AM  Chief Complaint:  I stop taking Lamictal.  I'm doing fine.  [place]      History of Present Illness:  Michelle Levy came for her follow-up appointment.  She stopped taking Lamictal.  He does not feel that she need any more medication.  On her last appointment she starting Seroquel.  She believe that she can handle her symptoms about medication.  She continues to attend mental health classes and hoping to start volunteer work for [employer] coming December.  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  She had a good relationship with her ex-husband.  Patient denies any irritability, anger, mania or psychosis.  She denies any feeling of hopelessness or worthlessness.  She can use to drink which she described as social as she does not keep any wine bottles at home.  She denies any intoxication, seizures, tremors or any blackouts.  Her energy level is good.  She realized that she had a history of bipolar and hospitalization but now she is feeling good and like to try without medication if she can do very well.  Her appetite is okay.  Her vital signs are stable.  Patient lives alone.  She is close to her grandparents.  Suicidal Ideation: No Plan Formed: No Patient has means to carry out plan: No  Homicidal Ideation: No Plan Formed: No Patient has means to carry out plan: No  Past Psychiatric History/Hospitalization(s): Patient endorse history of highs and lows and poor impulse control but she never seen psychiatrist or Admitted until recently require hospitalization in February 2017.  She was found to be grandiose, manic, erratic, labile with flight if ideas and delusions.  Patient denies any history of suicidal attempt Anxiety: No Bipolar Disorder: Symptoms of highs and lows, poor impulse control. Depression: No Mania: See above Psychosis:  No Schizophrenia: No Personality Disorder: No Hospitalization for psychiatric illness: Yes History of Electroconvulsive Shock Therapy: No Prior Suicide Attempts: No  Family History; Patient has strong family history of bipolar disorder.  Her mother has nervous breakdown and requires hospitalization.  Her sister has bipolar disorder and her other sister has depression.  Her father suffers from alcohol addiction.  Medical History; Patient see Dr. Theadore Nan at [facility].  She denies any history of traumatic brain injury , seizures .    Substance Abuse History; Patient admitted history of drinking since age 27.  She admitted drinking on and off but never felt that it is a problem.  Her blood alcohol was 159 when she was admitted to behavioral [place].  Patient denies any illegal substance use.  She claims to be sober since she left the hospital.  Patient denies any history of DUIs, tremors, shakes or any seizures.  Review of Systems  Skin: Negative for itching and rash.  Neurological: Negative for tremors and headaches.  Psychiatric/Behavioral: Negative for depression, hallucinations and suicidal ideas.    Psychiatric: Agitation: No Hallucination: No Depressed Mood: No Insomnia: No Hypersomnia: No Altered Concentration: No Feels Worthless: No Grandiose Ideas: No Belief In Special Powers: No New/Increased Substance Abuse: No Compulsions: No  Neurologic: Headache: No Seizure: No Paresthesias: No   Outpatient Encounter Prescriptions as of 08/24/2016  Medication Sig  . b complex vitamins tablet Take 1 tablet by mouth daily.  . calcium acetate (PHOSLO) 667 MG capsule Take by mouth 3 (three) times  daily with meals.  . cholecalciferol (VITAMIN D) 1000 units tablet Take 1,000 Units by mouth daily.  . magnesium 30 MG tablet Take 30 mg by mouth 2 (two) times daily. Patient takes 150mg s  . Multiple Vitamin (MULTIVITAMIN WITH MINERALS) TABS tablet Take 1 tablet by mouth  daily.  Marland Kitchen omega-3 acid ethyl esters (LOVAZA) 1 g capsule Take 1,000 mg by mouth 2 (two) times daily.  . QUEtiapine (SEROQUEL) 25 MG tablet Take 1 tablet (25 mg total) by mouth at bedtime.  . thiamine (VITAMIN B-1) 100 MG tablet Take 100 mg by mouth daily.  Merril Abbe 10 MCG TABS vaginal tablet INSERT 1 TABLET VAGINALLY DAILY FOR 3 WEEKS THEN 1 TABLET TWICE WEEKLY.  . [DISCONTINUED] lamoTRIgine (LAMICTAL) 25 MG tablet Take 2 tablets (50 mg total) by mouth daily with supper.   No facility-administered encounter medications on file as of 08/24/2016.     No results found for this or any previous visit (from the past 2160 hour(s)).    Constitutional:  BP 110/64   Pulse 71   Ht 5\' 3"  (1.6 m)   Wt 122 lb 12.8 oz (55.7 kg)   BMI 21.75 kg/m    Musculoskeletal: Strength & Muscle Tone: within normal limits Gait & Station: normal Patient leans: N/A  Psychiatric Specialty Exam: General Appearance: Casual  Eye Contact::  Good  Speech:  Normal Rate  Volume:  Normal  Mood:  Euthymic  Affect:  Appropriate  Thought Process:  Goal Directed  Orientation:  Full (Time, Place, and Person)  Thought Content:  Rumination  Suicidal Thoughts:  No  Homicidal Thoughts:  No  Memory:  Immediate;   Fair Recent;   Fair Remote;   Fair  Judgement:  Fair  Insight:  Fair  Psychomotor Activity:  Normal  Concentration:  Fair  Recall:  [place] of Knowledge:  Good  Language:  Fair  Akathisia:  No  Handed:  Right  AIMS (if indicated):     Assets:  Communication Skills Desire for Improvement Financial Resources/Insurance Housing Physical Health Social Support  ADL's:  Intact  Cognition:  WNL  Sleep:        Established Problem, Stable/Improving (1), Review of Psycho-Social Stressors (1), Review and summation of old records (2), Review of Last Therapy Session (1) and Review of New Medication or Change in Dosage (2)  Assessment: Axis I: Bipolar disorder type I.  Alcohol abuse.  Axis II:  Deferred  Axis III:  No past medical history on file.   Plan:  I review her past psychiatric history and risk of relapse.  Patient does not want to go back on medication.  She is no longer taking Lamictal and doing well without medication.  I encouraged to continue mental health classes .  Discuss risk of relapse and early signs of decompensation.  Patient agreed that she will call us if she feels that she need to see [place].  At this time she does not want to schedule any more appointment.  Recommended to call us back if she has any question, concern if she feels worsening of the symptoms.  Discuss safety plan that anytime having active suicidal thoughts or homicidal thoughts and she need to call 911 or go to the local emergency room.  Discuss her alcohol use but patient reassured that she is not drinking heavily and elevated about long-term complication and prognosis on her illness.   Jewel Venditto T., MD 08/24/2016

## 2016-08-25 DIAGNOSIS — F312 Bipolar disorder, current episode manic severe with psychotic features: Secondary | ICD-10-CM | POA: Diagnosis not present

## 2016-08-25 DIAGNOSIS — M85852 Other specified disorders of bone density and structure, left thigh: Secondary | ICD-10-CM | POA: Diagnosis not present

## 2016-09-15 DIAGNOSIS — F312 Bipolar disorder, current episode manic severe with psychotic features: Secondary | ICD-10-CM | POA: Diagnosis not present

## 2016-10-06 DIAGNOSIS — F312 Bipolar disorder, current episode manic severe with psychotic features: Secondary | ICD-10-CM | POA: Diagnosis not present

## 2016-11-10 DIAGNOSIS — F312 Bipolar disorder, current episode manic severe with psychotic features: Secondary | ICD-10-CM | POA: Diagnosis not present

## 2017-02-02 DIAGNOSIS — Z1231 Encounter for screening mammogram for malignant neoplasm of breast: Secondary | ICD-10-CM | POA: Diagnosis not present

## 2017-02-02 DIAGNOSIS — Z01419 Encounter for gynecological examination (general) (routine) without abnormal findings: Secondary | ICD-10-CM | POA: Diagnosis not present

## 2017-02-02 DIAGNOSIS — Z6821 Body mass index (BMI) 21.0-21.9, adult: Secondary | ICD-10-CM | POA: Diagnosis not present

## 2017-04-21 DIAGNOSIS — S40861A Insect bite (nonvenomous) of right upper arm, initial encounter: Secondary | ICD-10-CM | POA: Diagnosis not present

## 2017-04-21 DIAGNOSIS — T63301A Toxic effect of unspecified spider venom, accidental (unintentional), initial encounter: Secondary | ICD-10-CM | POA: Diagnosis not present

## 2017-04-21 DIAGNOSIS — L03113 Cellulitis of right upper limb: Secondary | ICD-10-CM | POA: Diagnosis not present

## 2017-04-22 DIAGNOSIS — F312 Bipolar disorder, current episode manic severe with psychotic features: Secondary | ICD-10-CM | POA: Diagnosis not present

## 2017-04-25 DIAGNOSIS — L03113 Cellulitis of right upper limb: Secondary | ICD-10-CM | POA: Diagnosis not present

## 2017-05-04 DIAGNOSIS — H2513 Age-related nuclear cataract, bilateral: Secondary | ICD-10-CM | POA: Diagnosis not present

## 2017-05-13 DIAGNOSIS — F312 Bipolar disorder, current episode manic severe with psychotic features: Secondary | ICD-10-CM | POA: Diagnosis not present

## 2017-05-23 DIAGNOSIS — F312 Bipolar disorder, current episode manic severe with psychotic features: Secondary | ICD-10-CM | POA: Diagnosis not present

## 2017-06-10 DIAGNOSIS — F312 Bipolar disorder, current episode manic severe with psychotic features: Secondary | ICD-10-CM | POA: Diagnosis not present

## 2017-08-05 DIAGNOSIS — F312 Bipolar disorder, current episode manic severe with psychotic features: Secondary | ICD-10-CM | POA: Diagnosis not present

## 2017-08-11 DIAGNOSIS — Z23 Encounter for immunization: Secondary | ICD-10-CM | POA: Diagnosis not present

## 2017-10-07 DIAGNOSIS — F312 Bipolar disorder, current episode manic severe with psychotic features: Secondary | ICD-10-CM | POA: Diagnosis not present

## 2017-12-09 DIAGNOSIS — F312 Bipolar disorder, current episode manic severe with psychotic features: Secondary | ICD-10-CM | POA: Diagnosis not present

## 2018-02-16 DIAGNOSIS — Z01419 Encounter for gynecological examination (general) (routine) without abnormal findings: Secondary | ICD-10-CM | POA: Diagnosis not present

## 2018-02-16 DIAGNOSIS — Z1231 Encounter for screening mammogram for malignant neoplasm of breast: Secondary | ICD-10-CM | POA: Diagnosis not present

## 2018-02-16 DIAGNOSIS — Z6821 Body mass index (BMI) 21.0-21.9, adult: Secondary | ICD-10-CM | POA: Diagnosis not present

## 2018-07-03 DIAGNOSIS — H2513 Age-related nuclear cataract, bilateral: Secondary | ICD-10-CM | POA: Diagnosis not present

## 2018-12-31 DIAGNOSIS — F312 Bipolar disorder, current episode manic severe with psychotic features: Secondary | ICD-10-CM | POA: Diagnosis not present

## 2019-02-19 DIAGNOSIS — Z0000 Encounter for general adult medical examination without abnormal findings: Secondary | ICD-10-CM | POA: Diagnosis not present

## 2019-02-19 DIAGNOSIS — M85852 Other specified disorders of bone density and structure, left thigh: Secondary | ICD-10-CM | POA: Diagnosis not present

## 2019-02-19 DIAGNOSIS — Z136 Encounter for screening for cardiovascular disorders: Secondary | ICD-10-CM | POA: Diagnosis not present

## 2019-02-19 DIAGNOSIS — Z131 Encounter for screening for diabetes mellitus: Secondary | ICD-10-CM | POA: Diagnosis not present

## 2019-02-20 ENCOUNTER — Other Ambulatory Visit: Payer: Self-pay | Admitting: Family Medicine

## 2019-02-20 DIAGNOSIS — M85852 Other specified disorders of bone density and structure, left thigh: Secondary | ICD-10-CM

## 2019-03-22 DIAGNOSIS — Z01419 Encounter for gynecological examination (general) (routine) without abnormal findings: Secondary | ICD-10-CM | POA: Diagnosis not present

## 2019-03-22 DIAGNOSIS — Z6821 Body mass index (BMI) 21.0-21.9, adult: Secondary | ICD-10-CM | POA: Diagnosis not present

## 2019-03-22 DIAGNOSIS — Z1231 Encounter for screening mammogram for malignant neoplasm of breast: Secondary | ICD-10-CM | POA: Diagnosis not present

## 2019-03-23 DIAGNOSIS — F312 Bipolar disorder, current episode manic severe with psychotic features: Secondary | ICD-10-CM | POA: Diagnosis not present

## 2019-05-24 ENCOUNTER — Ambulatory Visit
Admission: RE | Admit: 2019-05-24 | Discharge: 2019-05-24 | Disposition: A | Discharge status: Home / Self Care | Payer: BLUE CROSS/BLUE SHIELD | Source: Ambulatory Visit | Attending: Family Medicine | Admitting: Family Medicine

## 2019-05-24 ENCOUNTER — Other Ambulatory Visit: Payer: Self-pay

## 2019-05-24 DIAGNOSIS — Z780 Asymptomatic menopausal state: Secondary | ICD-10-CM | POA: Diagnosis not present

## 2019-05-24 DIAGNOSIS — M85852 Other specified disorders of bone density and structure, left thigh: Secondary | ICD-10-CM

## 2019-05-24 DIAGNOSIS — M85851 Other specified disorders of bone density and structure, right thigh: Secondary | ICD-10-CM | POA: Diagnosis not present

## 2019-07-09 DIAGNOSIS — N390 Urinary tract infection, site not specified: Secondary | ICD-10-CM | POA: Diagnosis not present

## 2019-10-30 DIAGNOSIS — R203 Hyperesthesia: Secondary | ICD-10-CM | POA: Diagnosis not present

## 2019-11-13 DIAGNOSIS — L578 Other skin changes due to chronic exposure to nonionizing radiation: Secondary | ICD-10-CM | POA: Diagnosis not present

## 2019-11-13 DIAGNOSIS — D225 Melanocytic nevi of trunk: Secondary | ICD-10-CM | POA: Diagnosis not present

## 2019-11-13 DIAGNOSIS — Z23 Encounter for immunization: Secondary | ICD-10-CM | POA: Diagnosis not present

## 2019-11-13 DIAGNOSIS — D2272 Melanocytic nevi of left lower limb, including hip: Secondary | ICD-10-CM | POA: Diagnosis not present

## 2019-11-13 DIAGNOSIS — D1801 Hemangioma of skin and subcutaneous tissue: Secondary | ICD-10-CM | POA: Diagnosis not present

## 2019-11-13 DIAGNOSIS — D2372 Other benign neoplasm of skin of left lower limb, including hip: Secondary | ICD-10-CM | POA: Diagnosis not present

## 2019-11-13 DIAGNOSIS — D1722 Benign lipomatous neoplasm of skin and subcutaneous tissue of left arm: Secondary | ICD-10-CM | POA: Diagnosis not present

## 2019-11-13 DIAGNOSIS — L918 Other hypertrophic disorders of the skin: Secondary | ICD-10-CM | POA: Diagnosis not present

## 2019-11-13 DIAGNOSIS — L719 Rosacea, unspecified: Secondary | ICD-10-CM | POA: Diagnosis not present

## 2019-11-13 DIAGNOSIS — L821 Other seborrheic keratosis: Secondary | ICD-10-CM | POA: Diagnosis not present

## 2020-02-25 DIAGNOSIS — Z01419 Encounter for gynecological examination (general) (routine) without abnormal findings: Secondary | ICD-10-CM | POA: Diagnosis not present

## 2020-02-25 DIAGNOSIS — E2839 Other primary ovarian failure: Secondary | ICD-10-CM | POA: Diagnosis not present

## 2020-02-25 DIAGNOSIS — M85852 Other specified disorders of bone density and structure, left thigh: Secondary | ICD-10-CM | POA: Diagnosis not present

## 2020-02-25 DIAGNOSIS — Z23 Encounter for immunization: Secondary | ICD-10-CM | POA: Diagnosis not present

## 2020-02-25 DIAGNOSIS — Z1211 Encounter for screening for malignant neoplasm of colon: Secondary | ICD-10-CM | POA: Diagnosis not present

## 2020-02-25 DIAGNOSIS — Z0000 Encounter for general adult medical examination without abnormal findings: Secondary | ICD-10-CM | POA: Diagnosis not present

## 2020-02-25 DIAGNOSIS — Z1159 Encounter for screening for other viral diseases: Secondary | ICD-10-CM | POA: Diagnosis not present

## 2020-02-25 DIAGNOSIS — Z131 Encounter for screening for diabetes mellitus: Secondary | ICD-10-CM | POA: Diagnosis not present

## 2020-03-04 DIAGNOSIS — Z1211 Encounter for screening for malignant neoplasm of colon: Secondary | ICD-10-CM | POA: Diagnosis not present

## 2020-03-12 ENCOUNTER — Other Ambulatory Visit: Payer: Self-pay | Admitting: Family Medicine

## 2020-03-12 DIAGNOSIS — Z1231 Encounter for screening mammogram for malignant neoplasm of breast: Secondary | ICD-10-CM

## 2020-03-24 ENCOUNTER — Other Ambulatory Visit: Payer: Self-pay

## 2020-03-24 ENCOUNTER — Ambulatory Visit
Admission: RE | Admit: 2020-03-24 | Discharge: 2020-03-24 | Disposition: A | Discharge status: Home / Self Care | Payer: PPO | Source: Ambulatory Visit | Attending: Family Medicine | Admitting: Family Medicine

## 2020-03-24 DIAGNOSIS — Z1231 Encounter for screening mammogram for malignant neoplasm of breast: Secondary | ICD-10-CM

## 2020-08-27 DIAGNOSIS — Z23 Encounter for immunization: Secondary | ICD-10-CM | POA: Diagnosis not present

## 2020-11-12 DIAGNOSIS — L719 Rosacea, unspecified: Secondary | ICD-10-CM | POA: Diagnosis not present

## 2020-11-12 DIAGNOSIS — D2372 Other benign neoplasm of skin of left lower limb, including hip: Secondary | ICD-10-CM | POA: Diagnosis not present

## 2020-11-12 DIAGNOSIS — D225 Melanocytic nevi of trunk: Secondary | ICD-10-CM | POA: Diagnosis not present

## 2020-11-12 DIAGNOSIS — L821 Other seborrheic keratosis: Secondary | ICD-10-CM | POA: Diagnosis not present

## 2020-11-12 DIAGNOSIS — D2272 Melanocytic nevi of left lower limb, including hip: Secondary | ICD-10-CM | POA: Diagnosis not present

## 2020-11-12 DIAGNOSIS — D1801 Hemangioma of skin and subcutaneous tissue: Secondary | ICD-10-CM | POA: Diagnosis not present

## 2020-11-12 DIAGNOSIS — L738 Other specified follicular disorders: Secondary | ICD-10-CM | POA: Diagnosis not present

## 2020-11-12 DIAGNOSIS — D2262 Melanocytic nevi of left upper limb, including shoulder: Secondary | ICD-10-CM | POA: Diagnosis not present

## 2021-02-23 DIAGNOSIS — H5203 Hypermetropia, bilateral: Secondary | ICD-10-CM | POA: Diagnosis not present

## 2021-02-23 DIAGNOSIS — H524 Presbyopia: Secondary | ICD-10-CM | POA: Diagnosis not present

## 2021-03-09 DIAGNOSIS — E2839 Other primary ovarian failure: Secondary | ICD-10-CM | POA: Diagnosis not present

## 2021-03-09 DIAGNOSIS — Z1389 Encounter for screening for other disorder: Secondary | ICD-10-CM | POA: Diagnosis not present

## 2021-03-09 DIAGNOSIS — H9193 Unspecified hearing loss, bilateral: Secondary | ICD-10-CM | POA: Diagnosis not present

## 2021-03-09 DIAGNOSIS — Z23 Encounter for immunization: Secondary | ICD-10-CM | POA: Diagnosis not present

## 2021-03-09 DIAGNOSIS — M85852 Other specified disorders of bone density and structure, left thigh: Secondary | ICD-10-CM | POA: Diagnosis not present

## 2021-03-09 DIAGNOSIS — Z0000 Encounter for general adult medical examination without abnormal findings: Secondary | ICD-10-CM | POA: Diagnosis not present

## 2021-03-11 ENCOUNTER — Other Ambulatory Visit: Payer: Self-pay | Admitting: Family Medicine

## 2021-03-11 DIAGNOSIS — Z1231 Encounter for screening mammogram for malignant neoplasm of breast: Secondary | ICD-10-CM

## 2021-03-11 DIAGNOSIS — M85852 Other specified disorders of bone density and structure, left thigh: Secondary | ICD-10-CM

## 2021-04-23 DIAGNOSIS — Z23 Encounter for immunization: Secondary | ICD-10-CM | POA: Diagnosis not present

## 2021-08-26 ENCOUNTER — Ambulatory Visit
Admission: RE | Admit: 2021-08-26 | Discharge: 2021-08-26 | Disposition: A | Discharge status: Home / Self Care | Payer: PPO | Source: Ambulatory Visit | Attending: Family Medicine | Admitting: Family Medicine

## 2021-08-26 ENCOUNTER — Other Ambulatory Visit: Payer: Self-pay

## 2021-08-26 DIAGNOSIS — M85852 Other specified disorders of bone density and structure, left thigh: Secondary | ICD-10-CM

## 2021-08-26 DIAGNOSIS — Z780 Asymptomatic menopausal state: Secondary | ICD-10-CM | POA: Diagnosis not present

## 2021-08-26 DIAGNOSIS — Z1231 Encounter for screening mammogram for malignant neoplasm of breast: Secondary | ICD-10-CM

## 2021-08-26 DIAGNOSIS — M8589 Other specified disorders of bone density and structure, multiple sites: Secondary | ICD-10-CM | POA: Diagnosis not present

## 2021-09-15 DIAGNOSIS — M25569 Pain in unspecified knee: Secondary | ICD-10-CM | POA: Diagnosis not present

## 2021-09-15 DIAGNOSIS — M8589 Other specified disorders of bone density and structure, multiple sites: Secondary | ICD-10-CM | POA: Diagnosis not present

## 2022-03-09 DIAGNOSIS — H5203 Hypermetropia, bilateral: Secondary | ICD-10-CM | POA: Diagnosis not present

## 2022-05-07 DIAGNOSIS — Z0000 Encounter for general adult medical examination without abnormal findings: Secondary | ICD-10-CM | POA: Diagnosis not present

## 2022-05-07 DIAGNOSIS — Z01419 Encounter for gynecological examination (general) (routine) without abnormal findings: Secondary | ICD-10-CM | POA: Diagnosis not present

## 2022-05-07 DIAGNOSIS — E2839 Other primary ovarian failure: Secondary | ICD-10-CM | POA: Diagnosis not present

## 2022-05-07 DIAGNOSIS — Z1331 Encounter for screening for depression: Secondary | ICD-10-CM | POA: Diagnosis not present

## 2022-05-07 DIAGNOSIS — M8589 Other specified disorders of bone density and structure, multiple sites: Secondary | ICD-10-CM | POA: Diagnosis not present

## 2022-07-15 ENCOUNTER — Other Ambulatory Visit: Payer: Self-pay | Admitting: Family Medicine

## 2022-07-15 DIAGNOSIS — Z1231 Encounter for screening mammogram for malignant neoplasm of breast: Secondary | ICD-10-CM

## 2022-08-27 ENCOUNTER — Ambulatory Visit: Payer: PPO

## 2022-09-15 ENCOUNTER — Ambulatory Visit
Admission: RE | Admit: 2022-09-15 | Discharge: 2022-09-15 | Disposition: A | Discharge status: Home / Self Care | Payer: PPO | Source: Ambulatory Visit | Attending: Family Medicine | Admitting: Family Medicine

## 2022-09-15 DIAGNOSIS — Z1231 Encounter for screening mammogram for malignant neoplasm of breast: Secondary | ICD-10-CM | POA: Diagnosis not present

## 2022-10-07 DIAGNOSIS — Z780 Asymptomatic menopausal state: Secondary | ICD-10-CM | POA: Diagnosis not present

## 2022-10-07 DIAGNOSIS — Z6823 Body mass index (BMI) 23.0-23.9, adult: Secondary | ICD-10-CM | POA: Diagnosis not present

## 2022-10-07 DIAGNOSIS — M8589 Other specified disorders of bone density and structure, multiple sites: Secondary | ICD-10-CM | POA: Diagnosis not present

## 2022-10-07 DIAGNOSIS — Z8249 Family history of ischemic heart disease and other diseases of the circulatory system: Secondary | ICD-10-CM | POA: Diagnosis not present

## 2022-11-10 DIAGNOSIS — D225 Melanocytic nevi of trunk: Secondary | ICD-10-CM | POA: Diagnosis not present

## 2022-11-10 DIAGNOSIS — L578 Other skin changes due to chronic exposure to nonionizing radiation: Secondary | ICD-10-CM | POA: Diagnosis not present

## 2022-11-10 DIAGNOSIS — L821 Other seborrheic keratosis: Secondary | ICD-10-CM | POA: Diagnosis not present

## 2022-11-10 DIAGNOSIS — D2272 Melanocytic nevi of left lower limb, including hip: Secondary | ICD-10-CM | POA: Diagnosis not present

## 2022-11-10 DIAGNOSIS — D1801 Hemangioma of skin and subcutaneous tissue: Secondary | ICD-10-CM | POA: Diagnosis not present

## 2022-11-10 DIAGNOSIS — D2372 Other benign neoplasm of skin of left lower limb, including hip: Secondary | ICD-10-CM | POA: Diagnosis not present

## 2022-11-10 DIAGNOSIS — D2262 Melanocytic nevi of left upper limb, including shoulder: Secondary | ICD-10-CM | POA: Diagnosis not present

## 2022-11-10 DIAGNOSIS — L719 Rosacea, unspecified: Secondary | ICD-10-CM | POA: Diagnosis not present

## 2022-12-17 DIAGNOSIS — Z1159 Encounter for screening for other viral diseases: Secondary | ICD-10-CM | POA: Diagnosis not present

## 2023-01-31 DIAGNOSIS — K5792 Diverticulitis of intestine, part unspecified, without perforation or abscess without bleeding: Secondary | ICD-10-CM | POA: Diagnosis not present

## 2023-01-31 DIAGNOSIS — Z6822 Body mass index (BMI) 22.0-22.9, adult: Secondary | ICD-10-CM | POA: Diagnosis not present

## 2023-03-11 DIAGNOSIS — H52223 Regular astigmatism, bilateral: Secondary | ICD-10-CM | POA: Diagnosis not present

## 2023-03-11 DIAGNOSIS — H5203 Hypermetropia, bilateral: Secondary | ICD-10-CM | POA: Diagnosis not present

## 2023-03-11 DIAGNOSIS — H524 Presbyopia: Secondary | ICD-10-CM | POA: Diagnosis not present

## 2023-05-12 ENCOUNTER — Other Ambulatory Visit: Payer: Self-pay | Admitting: Family Medicine

## 2023-05-12 DIAGNOSIS — Z0000 Encounter for general adult medical examination without abnormal findings: Secondary | ICD-10-CM | POA: Diagnosis not present

## 2023-05-12 DIAGNOSIS — E7800 Pure hypercholesterolemia, unspecified: Secondary | ICD-10-CM | POA: Diagnosis not present

## 2023-05-12 DIAGNOSIS — Z1231 Encounter for screening mammogram for malignant neoplasm of breast: Secondary | ICD-10-CM

## 2023-05-12 DIAGNOSIS — Z1331 Encounter for screening for depression: Secondary | ICD-10-CM | POA: Diagnosis not present

## 2023-05-12 DIAGNOSIS — Z6821 Body mass index (BMI) 21.0-21.9, adult: Secondary | ICD-10-CM | POA: Diagnosis not present

## 2023-05-12 DIAGNOSIS — M8589 Other specified disorders of bone density and structure, multiple sites: Secondary | ICD-10-CM | POA: Diagnosis not present

## 2023-05-12 DIAGNOSIS — F3170 Bipolar disorder, currently in remission, most recent episode unspecified: Secondary | ICD-10-CM | POA: Diagnosis not present

## 2023-05-16 ENCOUNTER — Other Ambulatory Visit: Payer: Self-pay | Admitting: Family Medicine

## 2023-05-16 DIAGNOSIS — M8589 Other specified disorders of bone density and structure, multiple sites: Secondary | ICD-10-CM

## 2023-06-14 IMAGING — MG MM DIGITAL SCREENING BILAT W/ TOMO AND CAD
8 series · 9 of 24 positions shown · non-contrast
Comparison: Previous exam(s).

CLINICAL DATA: Screening.

EXAM:
DIGITAL SCREENING BILATERAL MAMMOGRAM WITH TOMOSYNTHESIS AND CAD
TECHNIQUE: Bilateral screening digital craniocaudal and mediolateral oblique
mammograms were obtained. Bilateral screening digital breast
tomosynthesis was performed. The images were evaluated with
computer-aided detection.

[R CC synth-2D]
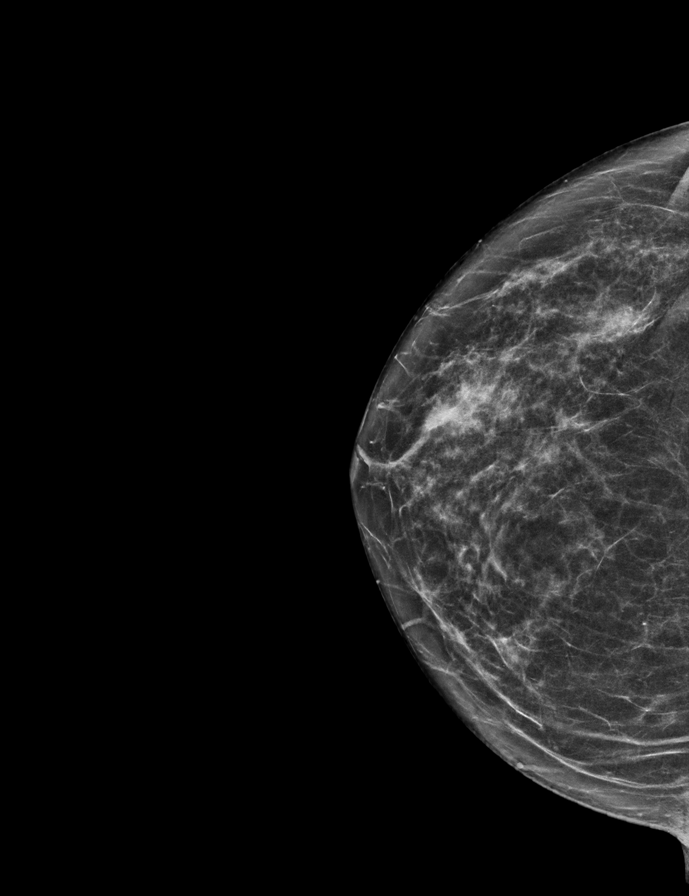

[R MLO synth-2D]
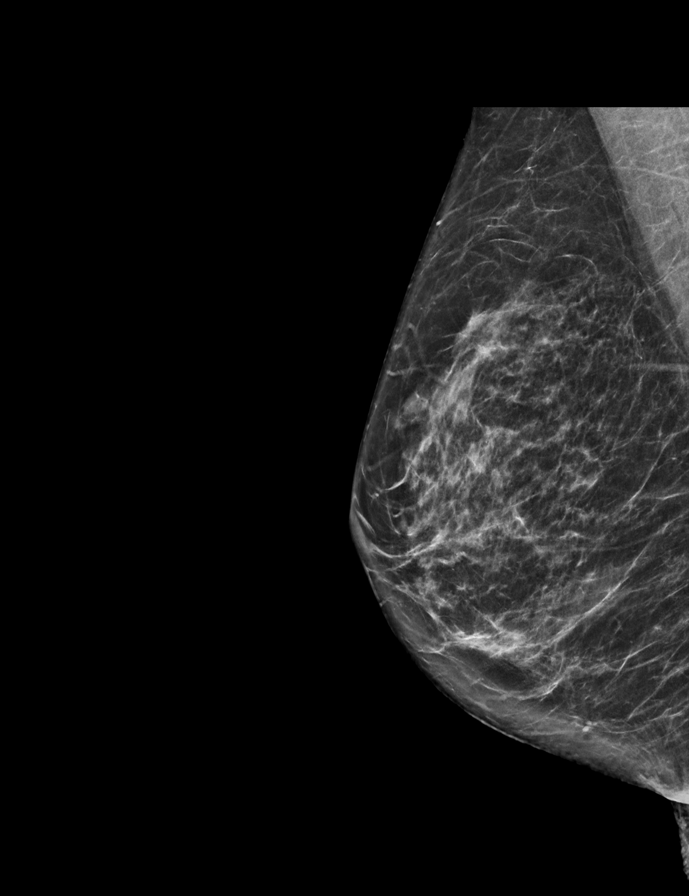

[L MLO synth-2D]
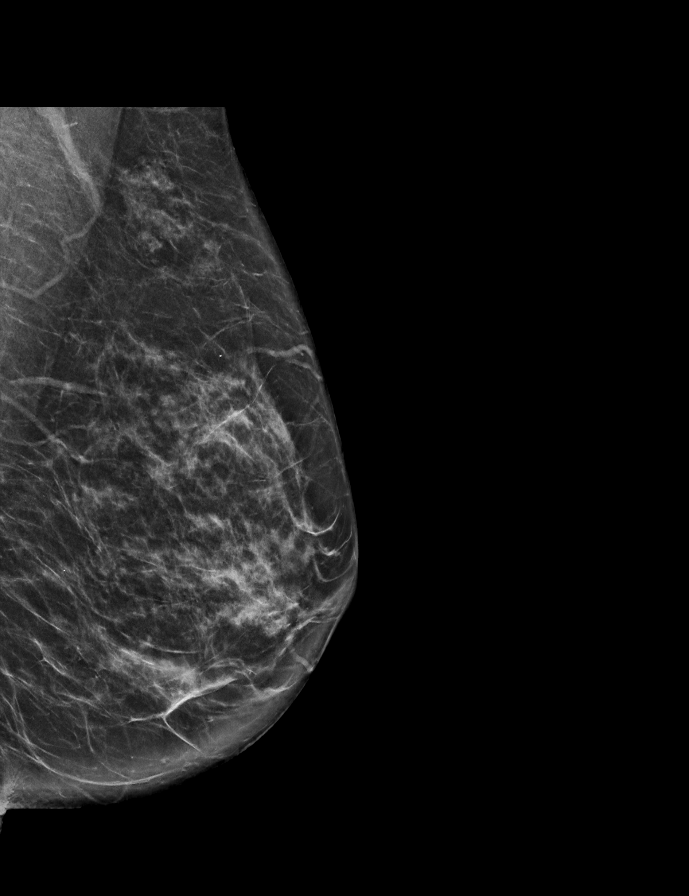

[L CC synth-2D]
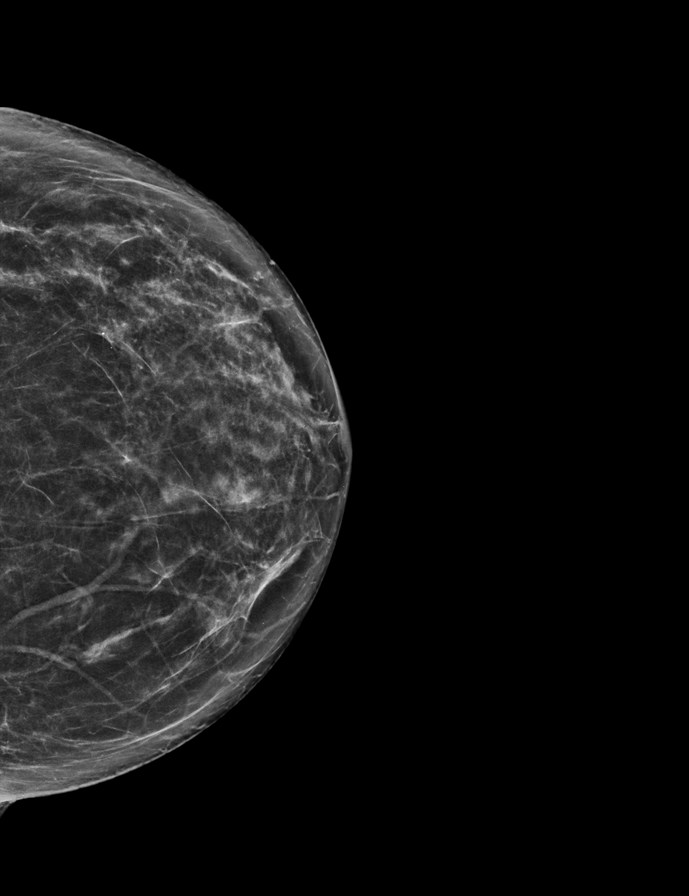

[L MLO tomo · 2 of 66 frames shown]
[frame 22/66]
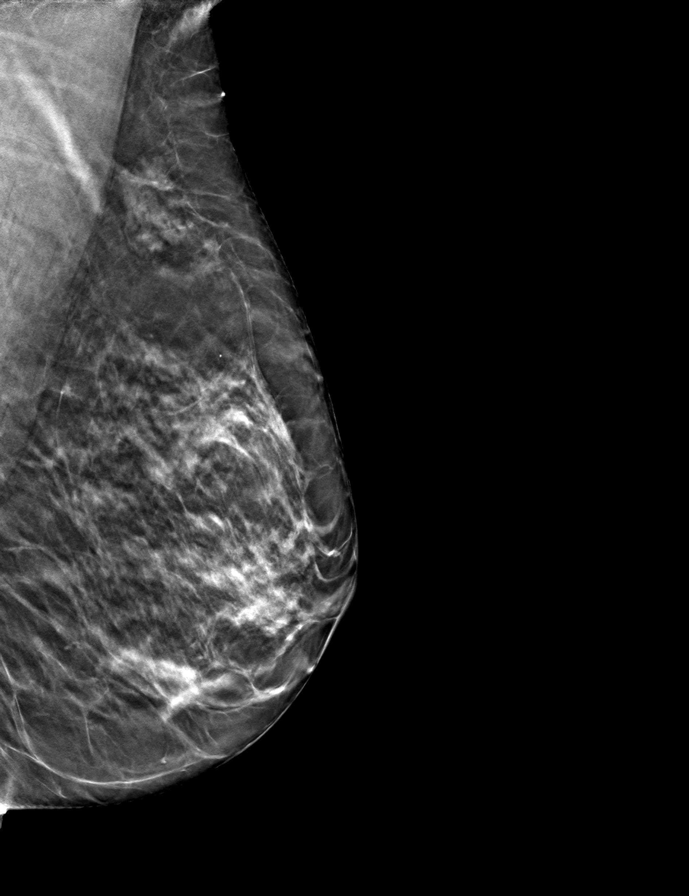
[frame 33/66]
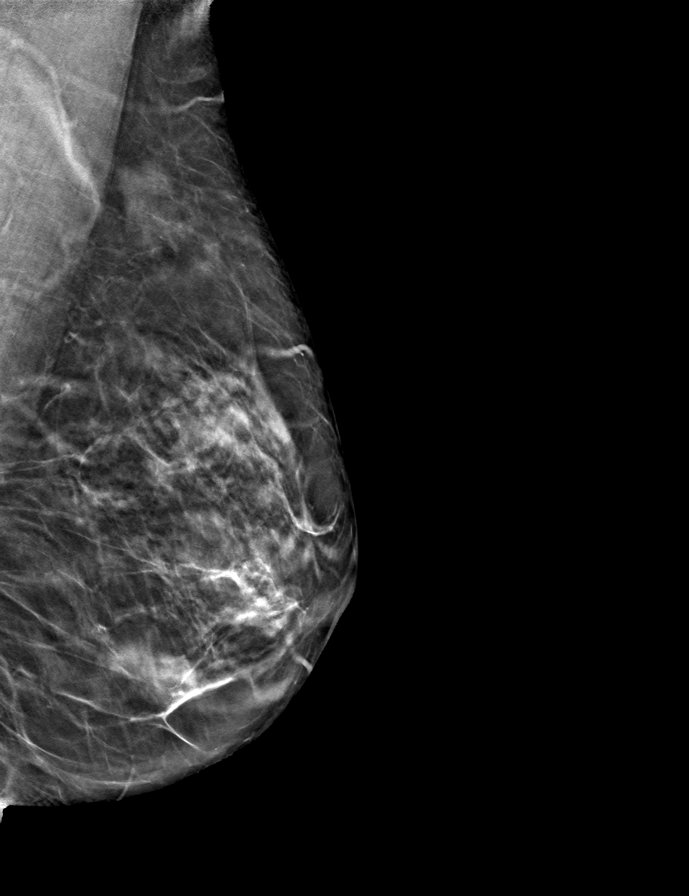

[R MLO tomo · tomo slice 35/69.0]
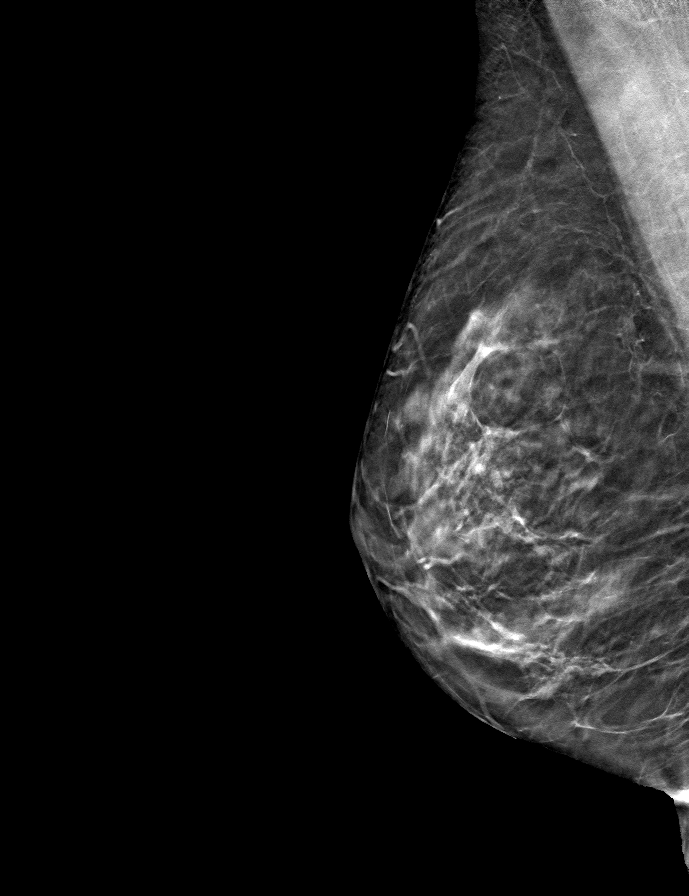

[R CC tomo · tomo slice 32/63.0]
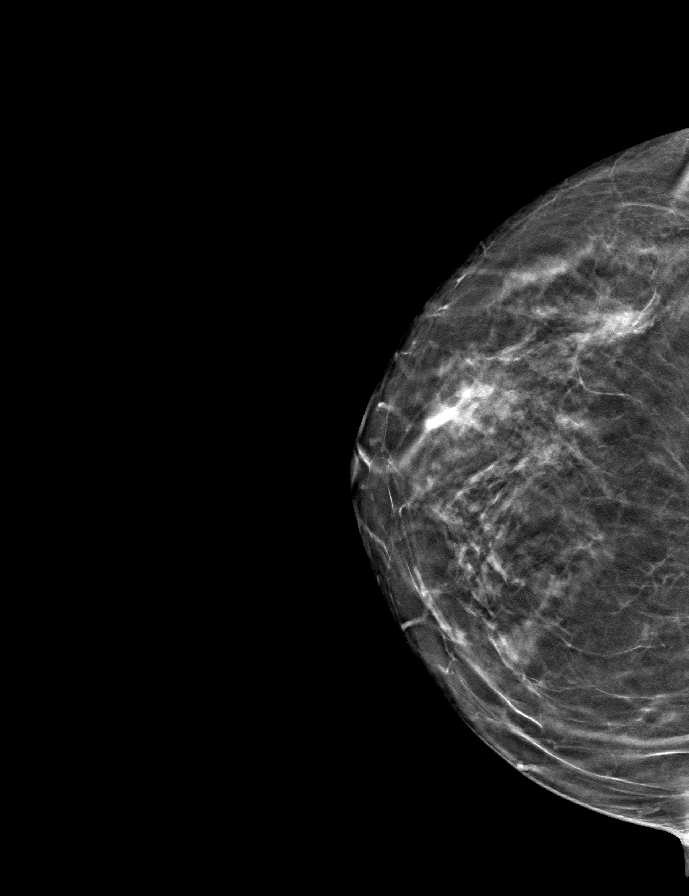

[L CC tomo · tomo slice 32/63.0]
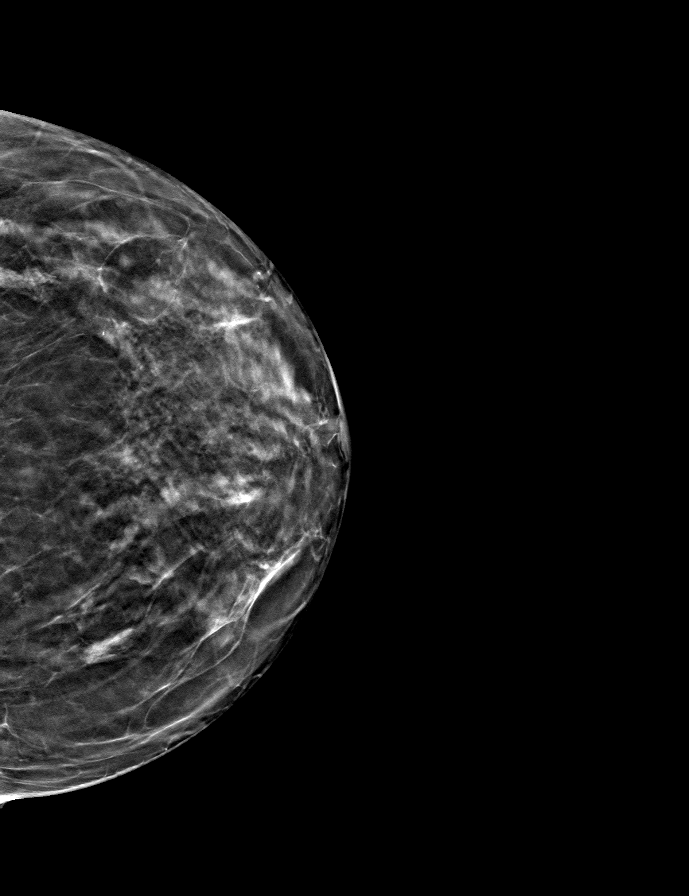

[9 of 24 positions shown; findings below may reference images not displayed]

ACR Breast Density Category c: The breast tissue is heterogeneously
dense, which may obscure small masses.
FINDINGS: There are no findings suspicious for malignancy.
IMPRESSION: No mammographic evidence of malignancy. A result letter of this
screening mammogram will be mailed directly to the patient.

RECOMMENDATION:
Screening mammogram in one year. (Code:Q3-W-BC3)

BI-RADS CATEGORY  1: Negative.

## 2023-09-06 ENCOUNTER — Ambulatory Visit (HOSPITAL_COMMUNITY)
Admission: EM | Admit: 2023-09-06 | Discharge: 2023-09-07 | Disposition: A | Discharge status: Other Acute Inpatient Hospital | Payer: PPO | Attending: Psychiatry | Admitting: Psychiatry

## 2023-09-06 DIAGNOSIS — I491 Atrial premature depolarization: Secondary | ICD-10-CM | POA: Diagnosis not present

## 2023-09-06 DIAGNOSIS — F3113 Bipolar disorder, current episode manic without psychotic features, severe: Secondary | ICD-10-CM

## 2023-09-06 DIAGNOSIS — F3110 Bipolar disorder, current episode manic without psychotic features, unspecified: Secondary | ICD-10-CM | POA: Diagnosis present

## 2023-09-06 DIAGNOSIS — Z91148 Patient's other noncompliance with medication regimen for other reason: Secondary | ICD-10-CM | POA: Diagnosis not present

## 2023-09-06 DIAGNOSIS — F314 Bipolar disorder, current episode depressed, severe, without psychotic features: Secondary | ICD-10-CM | POA: Insufficient documentation

## 2023-09-06 LAB — HEMOGLOBIN A1C
Hgb A1c MFr Bld: 5.3 % (ref 4.8–5.6)
Mean Plasma Glucose: 105.41 mg/dL

## 2023-09-06 LAB — URINALYSIS, COMPLETE (UACMP) WITH MICROSCOPIC
Bacteria, UA: NONE SEEN
Bilirubin Urine: NEGATIVE
Glucose, UA: 50 mg/dL — AB
Hgb urine dipstick: NEGATIVE
Ketones, ur: 5 mg/dL — AB
Leukocytes,Ua: NEGATIVE
Nitrite: NEGATIVE
Protein, ur: NEGATIVE mg/dL
Specific Gravity, Urine: 1.006 (ref 1.005–1.030)
pH: 7 (ref 5.0–8.0)

## 2023-09-06 LAB — CBC WITH DIFFERENTIAL/PLATELET
Abs Immature Granulocytes: 0.03 10*3/uL (ref 0.00–0.07)
Basophils Absolute: 0 10*3/uL (ref 0.0–0.1)
Basophils Relative: 0 %
Eosinophils Absolute: 0 10*3/uL (ref 0.0–0.5)
Eosinophils Relative: 0 %
HCT: 37.4 % (ref 36.0–46.0)
Hemoglobin: 13.2 g/dL (ref 12.0–15.0)
Immature Granulocytes: 0 %
Lymphocytes Relative: 23 %
Lymphs Abs: 2.3 10*3/uL (ref 0.7–4.0)
MCH: 31.1 pg (ref 26.0–34.0)
MCHC: 35.3 g/dL (ref 30.0–36.0)
MCV: 88.2 fL (ref 80.0–100.0)
Monocytes Absolute: 0.9 10*3/uL (ref 0.1–1.0)
Monocytes Relative: 9 %
Neutro Abs: 6.9 10*3/uL (ref 1.7–7.7)
Neutrophils Relative %: 68 %
Platelets: 317 10*3/uL (ref 150–400)
RBC: 4.24 MIL/uL (ref 3.87–5.11)
RDW: 12.2 % (ref 11.5–15.5)
WBC: 10.2 10*3/uL (ref 4.0–10.5)
nRBC: 0 % (ref 0.0–0.2)

## 2023-09-06 LAB — TSH: TSH: 1.128 u[IU]/mL (ref 0.350–4.500)

## 2023-09-06 LAB — POCT URINE DRUG SCREEN - MANUAL ENTRY (I-SCREEN)
POC Amphetamine UR: NOT DETECTED
POC Buprenorphine (BUP): NOT DETECTED
POC Cocaine UR: NOT DETECTED
POC Marijuana UR: NOT DETECTED
POC Methadone UR: NOT DETECTED
POC Methamphetamine UR: NOT DETECTED
POC Morphine: NOT DETECTED
POC Oxazepam (BZO): NOT DETECTED
POC Oxycodone UR: NOT DETECTED
POC Secobarbital (BAR): NOT DETECTED

## 2023-09-06 LAB — COMPREHENSIVE METABOLIC PANEL
ALT: 22 U/L (ref 0–44)
AST: 36 U/L (ref 15–41)
Albumin: 3.7 g/dL (ref 3.5–5.0)
Alkaline Phosphatase: 73 U/L (ref 38–126)
Anion gap: 11 (ref 5–15)
BUN: 12 mg/dL (ref 8–23)
CO2: 23 mmol/L (ref 22–32)
Calcium: 8.8 mg/dL — ABNORMAL LOW (ref 8.9–10.3)
Chloride: 91 mmol/L — ABNORMAL LOW (ref 98–111)
Creatinine, Ser: 0.73 mg/dL (ref 0.44–1.00)
GFR, Estimated: >60 mL/min (ref >60)
Glucose, Bld: 138 mg/dL — ABNORMAL HIGH (ref 70–99)
Potassium: 4.2 mmol/L (ref 3.5–5.1)
Sodium: 125 mmol/L — ABNORMAL LOW (ref 135–145)
Total Bilirubin: 0.9 mg/dL (ref <1.2)
Total Protein: 6.3 g/dL — ABNORMAL LOW (ref 6.5–8.1)

## 2023-09-06 LAB — ETHANOL: Alcohol, Ethyl (B): <10 mg/dL (ref <10)

## 2023-09-06 LAB — LIPID PANEL
Cholesterol: 198 mg/dL (ref 0–200)
HDL: 107 mg/dL (ref >40)
LDL Cholesterol: 85 mg/dL (ref 0–99)
Total CHOL/HDL Ratio: 1.9 {ratio}
Triglycerides: 29 mg/dL (ref <150)
VLDL: 6 mg/dL (ref 0–40)

## 2023-09-06 LAB — MAGNESIUM: Magnesium: 2.1 mg/dL (ref 1.7–2.4)

## 2023-09-06 MED ORDER — ALUM & MAG HYDROXIDE-SIMETH 200-200-20 MG/5ML PO SUSP: 30.0000 mL | ORAL | Status: DC | PRN

## 2023-09-06 MED ORDER — OLANZAPINE 5 MG PO TBDP
ORAL_TABLET | ORAL | Status: AC
  Administered 2023-09-06: 5 mg via SUBLINGUAL
  Filled 2023-09-06: qty 1

## 2023-09-06 MED ORDER — MAGNESIUM HYDROXIDE 400 MG/5ML PO SUSP: 30.0000 mL | Freq: Every day | ORAL | Status: DC | PRN

## 2023-09-06 MED ORDER — ACETAMINOPHEN 325 MG PO TABS: 650.0000 mg | ORAL_TABLET | Freq: Four times a day (QID) | ORAL | Status: DC | PRN

## 2023-09-06 MED ORDER — OLANZAPINE 5 MG PO TABS
5.0000 mg | ORAL_TABLET | Freq: Every day | ORAL | Status: DC
  Administered 2023-09-06: 5 mg via ORAL
  Filled 2023-09-06: qty 1

## 2023-09-06 MED ORDER — HYDROXYZINE HCL 25 MG PO TABS: 25.0000 mg | ORAL_TABLET | Freq: Three times a day (TID) | ORAL | Status: DC | PRN

## 2023-09-06 MED ORDER — TRAZODONE HCL 50 MG PO TABS: 50.0000 mg | ORAL_TABLET | Freq: Every evening | ORAL | Status: DC | PRN

## 2023-09-06 NOTE — BH Assessment (Signed)
Comprehensive Clinical Assessment (CCA) Note  09/06/2023 Michelle Levy [id number]  DISPOSITION:  Per Vernard Gambles NP pt is recommended for Inpatient psychiatric treatment  The patient demonstrates the following risk factors for suicide: Chronic risk factors for suicide include: psychiatric disorder of Bipolar d/o . Acute risk factors for suicide include: loss (financial, interpersonal, professional). Protective factors for this patient include: positive social support, responsibility to others (children, family), and hope for the future. Considering these factors, the overall suicide risk at this point appears to be low. Patient is appropriate for outpatient follow up.   Per Triage assessment: "Michelle Levy is a 67 year old female who presents to [facility] escorted by [employer] under IVC. Per GPD they picked her up from the hair salon.Per IVC " Respondent has been diagnosed with Bipolar disorder and does not take medication. Respondent was diagnosed 8 years ago and stayed on top of her medication for a year and has not taken it since. Around election time, the respondent becomes manic. Respondent has said that "they are coming for me". She believes she is having a breakthrough, Respondent is writing on her facebook rapidly with posts. Respondent believes God is speaking to her and believes the coming of christ is coming. Respondent will not allow anyone to come into her home. Respondent does not want to get help. Respondent has not slept in a few daysa and respondent is not eating". She does reports she has not taken her medication because her "psychiatrist is threatening, he told me that if I don't take my medication I will end up in the hospital". She states she believes she stopped taking her medication in 09-17-2017. She reports her sister died in 17-Sep-2016 and has ongoing grief. Pt has tangential speech and often has to be redirected. Pt states she is not currently established with outpatient therapy or psychiatry. Pt  denies past suicide attempts. She reports she lives alone.Pt denies SI/HI and AVH currently.  With further assessment: Pt is tangential, demanding, restless, hyperverbal, hyper-religious with somewhat pressured speech. Pt gave verbal permission to speak with daughter Michelle Levy-petitioner) for collateral information. Per daughter, pt is isolating at home. Per her daughter, pt's home is in disarray which is out of character. Per daughter, recently pt posted many posts of a religious nature on Facebook which were out of character for pt. Pt stated that she is upset over the national election results which seems to have triggered a manic episode. Per IVC, pt is not sleeping or eating much in recent days.   Pt denied SI, HI, NSSH, AVH, paranoia and any substance use. Pt has been diagnosed with Bipolar d/o and has not taken her prescribed medications several years ago. Pt stated she does not currently have a psychiatrist or OP therapist.  Pt lives alone and own's [employer]. Pt has 2 adult children.    Chief Complaint:  Chief Complaint  Patient presents with   IVC   Manic Behavior   Visit Diagnosis:  Bipolar d/o    CCA Screening, Triage and Referral (STR)  Patient Reported Information How did you hear about [place]? Legal System  What Is the Reason for Your Visit/Call Today? Michelle Levy is a 67 year old female who presents to [facility] escorted by [employer] under IVC. Per GPD they picked her up from the hair salon.Per IVC " Respondent has been diagnosed with Bipolar disorder and does not take medication. Respondent was diagnosed 8 years ago and stayed on top of her medication for a  year and has not taken it since. Around election time, the respondent becomes manic. Respondent has said that "they are coming for me". She believes she is having a breakthrough, Respondent is writing on her facebook rapidly with posts. Respondent believes God is speaking to her and believes the coming of christ is coming.  Respondent will not allow anyone to come into her home. Respondent does not want to get help. Respondent has not slept in a few daysa and respondent is not eating". She does reports she has not taken her medication because her "psychiatrist is threatening, he told me that if I don't take my medication I will end up in the hospital". She states she believes she stopped taking her medication in September 28, 2017. She reports her sister died in 09/28/16 and has ongoing grief. Pt has tangential speech and often has to be redirected. Pt states she is not currently established with outpatient therapy or psychiatry. Pt denies past suicide attempts. She reports she lives alone.Pt denies SI/HI and AVH currently.  How Long Has This Been Causing You Problems? <Week  What Do You Feel Would Help You the Most Today? Treatment for Depression or other mood problem   Have You Recently Had Any Thoughts About Hurting Yourself? No  Are You Planning to Commit Suicide/Harm Yourself At This time? No   Flowsheet Row ED from 09/06/2023 in [facility]  C-SSRS RISK CATEGORY No Risk       Have you Recently Had Thoughts About Hurting Someone [name]? No  Are You Planning to Harm Someone at This Time? No  Explanation: na  Have You Used Any Alcohol or Drugs in the Past 24 Hours? No  What Did You Use and How Much? na  Do You Currently Have a Therapist/Psychiatrist? No  Name of Therapist/Psychiatrist: Name of Therapist/Psychiatrist: na   Have You Been Recently Discharged From Any Office Practice or Programs? No  Explanation of Discharge From Practice/Program: na     CCA Screening Triage Referral Assessment Type of Contact: Face-to-Face  Telemedicine Service Delivery:   Is this Initial or Reassessment?   Date Telepsych consult ordered in [facility]:    Time Telepsych consult ordered in [facility]:    Location of Assessment: [facility] Assessment Services  Provider Location: [facility] Assessment  Services   Collateral Involvement: [name], daughter and IVC petitioner, was contacted for collateral information.   Does Patient Have a Automotive engineer Guardian? No  Legal Guardian Contact Information: na  Copy of Legal Guardianship Form: No - copy requested  Legal Guardian Notified of Arrival: -- (na)  Legal Guardian Notified of Pending Discharge: -- (na)  If Minor and Not Living with Parent(s), Who has Custody? adult  Is CPS involved or ever been involved? -- (none reported)  Is APS involved or ever been involved? -- (none reported)   Patient Determined To Be At Risk for Harm To Self or Others Based on Review of Patient Reported Information or Presenting Complaint? No  Method: No Plan  Availability of Means: No access or NA  Intent: Vague intent or NA  Notification Required: No need or identified person  Additional Information for Danger to Others Potential: -- (Manic episode, possibly delusional)  Additional Comments for Danger to Others Potential: na  Are There Guns or Other Weapons in Your Home? yes Types of Guns/Weapons: Has a handgun locked in a safe case. Has a concealed permit. Daughter stated she would secure the gun.  Are These Weapons Safely Secured?  Yes  Who Could Verify You Are Able To Have These Secured: daughter, Rudene Anda You Have any Outstanding Charges, Pending Court Dates, Parole/Probation? denied  Contacted To Inform of Risk of Harm To Self or Others: -- (na)    Does Patient Present under Involuntary Commitment? Yes    [place] of Residence: [place]   Patient Currently Receiving the Following Services: Not Receiving Services   Determination of Need: Emergent (2 hours) (Per Vernard Gambles NP pt is recommended for Inpatient psychiatric treatment)   Options For Referral: Inpatient Hospitalization     CCA Biopsychosocial Patient Reported Schizophrenia/Schizoaffective Diagnosis in Past:  No   Strengths: able to accept help   Mental Health Symptoms Depression:   Difficulty Concentrating; Change in energy/activity; Sleep (too much or little)   Duration of Depressive symptoms:  Duration of Depressive Symptoms: Greater than two weeks   Mania:   Change in energy/activity; Increased Energy; Overconfidence; Racing thoughts; Recklessness   Anxiety:    Difficulty concentrating; Restlessness; Sleep; Worrying   Psychosis:   Delusions (possible religious delusions)   Duration of Psychotic symptoms:  Duration of Psychotic Symptoms: Less than six months   Trauma:   None   Obsessions:   Cause anxiety; Disrupts routine/functioning; Poor insight; Recurrent & persistent thoughts/impulses/images (the national election results)   Compulsions:   None   Inattention:   N/A   Hyperactivity/Impulsivity:   N/A   Oppositional/Defiant Behaviors:   N/A   Emotional Irregularity:   None   Other Mood/Personality Symptoms:   none    Mental Status Exam Appearance and self-care  Stature:   Average   Weight:   Average weight   Clothing:   Casual; Neat/clean   Grooming:   Normal   Cosmetic use:   Age appropriate   Posture/gait:   Normal   Motor activity:   Restless   Sensorium  Attention:   Vigilant   Concentration:   Focuses on irrelevancies; Preoccupied   Orientation:   X5   Recall/memory:   Normal   Affect and Mood  Affect:   Full Range; Labile; Anxious   Mood:   Anxious; Negative   Relating  Eye contact:   Fleeting   Facial expression:   Anxious   Attitude toward examiner:   Cooperative; Dramatic; Resistant   Thought and Language  Speech flow:  Pressured; Flight of Ideas   Thought content:   Delusions   Preoccupation:   Religion; Ruminations (election results)   Hallucinations:   None   Organization:   Intact; Loose   Company secretary of Knowledge:   Average   Intelligence:   Average   Abstraction:    Functional   Judgement:   Impaired   Reality Testing:   Distorted   Insight:   Lacking   Decision Making:   Impulsive   Social Functioning  Social Maturity:   Impulsive   Social Judgement:   Victimized   Stress  Stressors:   Grief/losses; Other (Comment) (politics)   Coping Ability:   Deficient supports; Exhausted; Overwhelmed   Skill Deficits:   None   Supports:   Family; Friends/Service system; Support needed     Religion: Religion/Spirituality Are You A Religious Person?: Yes What is Your Religious Affiliation?: Christian How Might This Affect Treatment?: religious delusions  Leisure/Recreation: Leisure / Recreation Do You Have Hobbies?: No  Exercise/Diet: Exercise/Diet Do You Exercise?: No Have You Gained or Lost A Significant Amount of Weight in the Past Six Months?: No Do You Follow  a Special Diet?: No Do You Have Any Trouble Sleeping?: Yes Explanation of Sleeping Difficulties: Per daughter pt has not slept in several days   CCA Employment/Education Employment/Work Situation: Employment / Work Situation Employment Situation: Employed Work Stressors: owning a Engineer, 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 Been Impacted by Current Illness: No (Job offers a lot of freedom and flexibility) Has Patient ever Been in Equities trader?: No  Education: Education Is Patient Currently Attending School?: No Last Grade Completed: 12 Did You Product manager?: No Did You Have An Individualized Education Program (IIEP): No Did You Have Any Difficulty At [employer]?: No Patient's Education Has Been Impacted by Current Illness: No   CCA Family/Childhood History Family and Relationship History: Family history Marital status: Divorced Divorced, when?: about 10 years ago What types of issues is patient dealing with in the relationship?: unknonwn Additional relationship information: na Does patient have children?: Yes How many children?: 2 How is patient's relationship with  their children?: close with daughter; not close with son  Childhood History:  Childhood History By whom was/is the patient raised?: Both parents Did patient suffer any verbal/emotional/physical/sexual abuse as a child?: No Has patient ever been sexually abused/assaulted/raped as an adolescent or adult?: No Witnessed domestic violence?: No Has patient been affected by domestic violence as an adult?: No       CCA Substance Use Alcohol/Drug Use: Alcohol / Drug Use Pain Medications: see MAR Prescriptions: see MAR Over the Counter: see MAR History of alcohol / drug use?: Yes Longest period of sobriety (when/how long): unknown Negative Consequences of Use: Legal, Personal relationships, Work / School Withdrawal Symptoms: Patient aware of relationship between substance abuse and physical/medical complications                         ASAM's:  Six Dimensions of Multidimensional Assessment  Dimension 1:  Acute Intoxication and/or Withdrawal Potential:   Dimension 1:  Description of individual's past and current experiences of substance use and withdrawal: none reported  Dimension 2:  Biomedical Conditions and Complications:   Dimension 2:  Description of patient's biomedical conditions and  complications: none reported  Dimension 3:  Emotional, Behavioral, or Cognitive Conditions and Complications:  Dimension 3:  Description of emotional, behavioral, or cognitive conditions and complications: Hx of Bipolar d/o  Dimension 4:  Readiness to Change:     Dimension 5:  Relapse, Continued use, or Continued Problem Potential:     Dimension 6:  Recovery/Living Environment:     ASAM Severity Score: ASAM's Severity Rating Score: 3  ASAM Recommended Level of Treatment: ASAM Recommended Level of Treatment: Level I Outpatient Treatment   Substance use Disorder (SUD) Substance Use Disorder (SUD)  Checklist Symptoms of Substance Use:  (none reported)  Recommendations for  Services/Supports/Treatments: Recommendations for Services/Supports/Treatments Recommendations For Services/Supports/Treatments: Individual Therapy  Discharge Disposition:    DSM5 Diagnoses: Patient Active Problem List   Diagnosis Date Noted   Bipolar disorder, curr episode mixed, severe, w/o psychotic features (HCC) 11/24/2015   Alcohol use disorder, severe, dependence (HCC) 11/24/2015   Bereavement 11/24/2015     Referrals to Alternative Service(s): Referred to Alternative Service(s):   Place:   Date:   Time:    Referred to Alternative Service(s):   Place:   Date:   Time:    Referred to Alternative Service(s):   Place:   Date:   Time:    Referred to Alternative Service(s):   Place:   Date:   Time:  Carolanne Grumbling, Counselor

## 2023-09-06 NOTE — Progress Notes (Signed)
   09/06/23 1334  [facility] Triage Screening (Walk-ins at [facility] only)  How Did You Hear About [place]? Legal System  What Is the Reason for Your Visit/Call Today? Michelle Levy is a 67 year old female who presents to [facility] escorted by GPD under IVC. Per GPD they picked her up from the hair salon.Per IVC " Respondent has been diagnosed with Bipolar disorder and does not take medication. Respondent was diagnosed 8 years ago and stayed on top of her medication for a year and has not taken it since. Around election time, the respondent becomes manic. Respondent has said that "they are coming for me". She believes she is having a breakthrough, Respondent is writing on her facebook rapidly with posts. Respondent believes God is speaking to her and believes the coming of christ is coming. Respondent will not allow anyone to come into her home. Respondent does not want to get help. Respondent has not slept in a few daysa and respondent is not eating". She does reports she has not taken her medication because her "psychiatrist is threatening, he told me that if I don't take my medication I will end up in the hospital". She states she believes she stopped taking her medication in 2017-09-16. She reports her sister died in 09/16/16 and has ongoing grief. Pt has tangential speech and often has to be redirected. Pt states she is not currently established with outpatient therapy or psychiatry. Pt denies past suicide attempts. She reports she lives alone.Pt denies SI/HI and AVH currently.  How Long Has This Been Causing You Problems? <Week  Have You Recently Had Any Thoughts About Hurting Yourself? No  Are You Planning to Commit Suicide/Harm Yourself At This time? No  Have you Recently Had Thoughts About Hurting Someone [name]? No  Are You Planning To Harm Someone At This Time? No  Are you currently experiencing any auditory, visual or other hallucinations? No  Have You Used Any Alcohol or Drugs in the Past 24 Hours? No  Do you have any current  medical co-morbidities that require immediate attention? No  Clinician description of patient physical appearance/behavior: tangential speech, has to be redirected.  What Do You Feel Would Help You the Most Today? Treatment for Depression or other mood problem  If access to [facility] was not available, would you have sought care in the Emergency Department? No  Determination of Need Urgent (48 hours)  Options For Referral Inpatient Hospitalization;BH Urgent Care;Medication Management

## 2023-09-06 NOTE — ED Notes (Signed)
Pt asleep at this hour. No apparent distress. RR even and unlabored. Monitored for safety.  

## 2023-09-06 NOTE — ED Notes (Signed)
Pt at nurse's station at this hour. No apparent distress. RR even and unlabored. Monitored for safety.  

## 2023-09-06 NOTE — ED Provider Notes (Signed)
[facility] Continuous Assessment Admission H&P  Date: 09/06/23 Patient Name: Michelle Levy MRN: [id number] Chief Complaint: under Involuntary Commitment.   Diagnoses:  Final diagnoses:  Bipolar disorder, current episode manic without psychotic features, severe (HCC)    HPI: patient presented to [facility] as a walk in accompanied by [employer] under IVC due to manic behavior.   Patient petitioned under IVC by her daughter Michelle Levy [phone].  IVC findings are as follows, "respondent has been diagnosed with bipolar disorder and does not take medication.  Respondent was diagnosed 8 years ago and stayed on top of her medication for a year and has not taken it since.  Around election time, the respondent became manic.  Respondent has said that "they are coming for me", she believes she is having a breakthrough, respondent is writing on her Facebook rapidly with post.  Respondent believes God is speaking to her and believes that coming of Michelle Levy is coming.  Respondent will not allow anyone to come into her home.  Respondent does not want to get help.  Respondent has not slept in a few days and respondent is not eating.  Michelle Levy, 67 y.o., female patient seen face to face by this provider and Dr. Lucianne Muss; and chart reviewed on 09/06/23.  Per chart review patient has a past psychiatric history of bipolar disorder mixed episodes and manic psychosis.  She had an inpatient psychiatric admission in 2017 at [facility].  Unsure when she has taken medications last.  Per chart review patient's last encounter for psychiatric medications was 08/24/2016 with Dr. Lolly Levy.  During evaluation Michelle Levy is sitting in the assessment room in no acute distress.  She  is alert, oriented x 4, cooperative and fairly attentive. She is easily distracted and has to be redirected through out the assessment. Her speech is clear, coherent, at a normal tone.  However she is hyperverbal and has pressured speech at times.  She  is tangential. She is unable to state when she has slept last.  She is currently denying any depressive symptoms and states her mood is "wonderful". She is euphoric. She is hyper religious and ruminates. She does not have a response to the findings in the IVC and appears confused as to why she was brought in. States, "I told my son I was safe and I would call him back, but I guess I didn't call soon enough". She appears anxious and states she has been listening to her body and her legs have been telling her to move. She is exhibiting manic behavior. She denies SI/HI/AVH.    Contact Michelle Levy [phone] patient's daughter/petitioner. She is concerned for patients safety. Reports patient's condition has progressively declined over the past few weeks.  She does not believe that patient has been on medications since the first Michelle Levy election in 2017 at that time patient had a breakdown and had to be psychiatrically hospitalized.  States after this election 2024 she has become more manic.  Over the past 2 days she has made over 50 Facebook post about various things but mostly religion.  One of her last post stated, "it is done and I can rest now".  Patient's daughter became concerned for her safety as she does have a firearm in the home.  Daughter is securing weapon. States one of patient's friends went to her home and the house is in disarray which is not characteristic of patient as she is normally a very organized person. 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    Total Time spent with patient: 45 minutes  Musculoskeletal  Strength & Muscle Tone: within normal limits Gait & Station: normal Patient leans: N/A  Psychiatric Specialty Exam  Presentation General Appearance: Casual  Eye Contact:Fleeting  Speech:Clear and Coherent; Pressured  Speech Volume:Increased  Handedness:Right   Mood and Affect  Mood:Anxious  Affect:Congruent   Thought Process  Thought  Processes:Coherent  Descriptions of Associations:Intact  Orientation:Full (Time, Place and Person)  Thought Content:Rumination; Tangential    Hallucinations:Hallucinations: None  Ideas of Reference:None  Suicidal Thoughts:Suicidal Thoughts: No  Homicidal Thoughts:Homicidal Thoughts: No   Sensorium  Memory:Immediate Fair; Recent Fair; Remote Fair  Judgment:Poor  Insight:Poor   Executive Functions  Concentration:Fair  Attention Span:Fair  Recall:Fair  Fund of Knowledge:Fair  Language:Fair   Psychomotor Activity  Psychomotor Activity:Psychomotor Activity: Normal   Assets  Assets:Housing; Leisure Time; Physical Health; Resilience; Social Support   Sleep  Sleep:Sleep: Poor Number of Hours of Sleep: 0   Nutritional Assessment (For OBS and FBC admissions only) Has the patient had a weight loss or gain of 10 pounds or more in the last 3 months?: No Has the patient had a decrease in food intake/or appetite?: No Does the patient have dental problems?: No Does the patient have eating habits or behaviors that may be indicators of an eating disorder including binging or inducing vomiting?: No Has the patient recently lost weight without trying?: 2.0 Has the patient been eating poorly because of a decreased appetite?: 0 Malnutrition Screening Tool Score: 2    Physical Exam Vitals and nursing note reviewed.  Constitutional:      Appearance: Normal appearance.  HENT:     Head: Normocephalic.  Eyes:     General:        Right eye: No discharge.        Left eye: No discharge.  Cardiovascular:     Rate and Rhythm: Normal rate.  Pulmonary:     Effort: Pulmonary effort is normal. No respiratory distress.  Musculoskeletal:        General: Normal range of motion.     Cervical back: Normal range of motion.  Skin:    Coloration: Skin is not jaundiced or pale.  Neurological:     Mental Status: She is alert and oriented to person, place, and time.  Psychiatric:         Attention and Perception: Attention and perception normal.        Mood and Affect: Affect normal. Mood is anxious.        Speech: Speech is rapid and pressured.        Behavior: Behavior is hyperactive.        Thought Content: Thought content normal.        Judgment: Judgment is impulsive.    Review of Systems  Constitutional:  Negative for chills and fever.  HENT:  Negative for hearing loss.   Respiratory:  Negative for cough.   Cardiovascular:  Negative for chest pain.  Musculoskeletal: Negative.   Neurological:  Negative for seizures.  Psychiatric/Behavioral:  The patient is nervous/anxious and has insomnia.     Blood pressure (!) 150/79, pulse 86, temperature 98 F (36.7 C), temperature source Oral, resp. rate 18, SpO2 100%. There is no height or weight on file to calculate BMI.  Past Psychiatric History: Bipolar disorder with mixed episodes.  Alcohol use disorder.  Inpatient admission at [facility] 11/2015.  No history of suicide attempt  Is the patient  at risk to self? No  Has the patient been a risk to self in the past 6 months? No .    Has the patient been a risk to self within the distant past? No   Is the patient a risk to others? No   Has the patient been a risk to others in the past 6 months? No   Has the patient been a risk to others within the distant past? No   Past Medical History: Alcohol use disorder  Family History:  Mother-nervous breakdown and required hospitalization Sister had bipolar disorder and is now deceased Other sister -depression Father-alcoholism  Social History:   Divorced has 2 grown children (son/daughter) Lives alone   Last Labs:  Admission on 09/06/2023  Component Date Value Ref Range Status   POC Amphetamine UR 09/06/2023 None Detected  NONE DETECTED (Cut Off Level 1000 ng/mL) Final   POC Secobarbital (BAR) 09/06/2023 None Detected  NONE DETECTED (Cut Off Level 300 ng/mL) Final   POC Buprenorphine (BUP) 09/06/2023 None Detected   NONE DETECTED (Cut Off Level 10 ng/mL) Final   POC Oxazepam (BZO) 09/06/2023 None Detected  NONE DETECTED (Cut Off Level 300 ng/mL) Final   POC Cocaine UR 09/06/2023 None Detected  NONE DETECTED (Cut Off Level 300 ng/mL) Final   POC Methamphetamine UR 09/06/2023 None Detected  NONE DETECTED (Cut Off Level 1000 ng/mL) Final   POC Morphine 09/06/2023 None Detected  NONE DETECTED (Cut Off Level 300 ng/mL) Final   POC Methadone UR 09/06/2023 None Detected  NONE DETECTED (Cut Off Level 300 ng/mL) Final   POC Oxycodone UR 09/06/2023 None Detected  NONE DETECTED (Cut Off Level 100 ng/mL) Final   POC Marijuana UR 09/06/2023 None Detected  NONE DETECTED (Cut Off Level 50 ng/mL) Final    Allergies: Patient has no known allergies.  Medications:  Facility Ordered Medications  Medication   [COMPLETED] OLANZapine zydis (ZYPREXA) 5 MG disintegrating tablet   acetaminophen (TYLENOL) tablet 650 mg   alum & mag hydroxide-simeth (MAALOX/MYLANTA) 200-200-20 MG/5ML suspension 30 mL   magnesium hydroxide (MILK OF MAGNESIA) suspension 30 mL   traZODone (DESYREL) tablet 50 mg   hydrOXYzine (ATARAX) tablet 25 mg   OLANZapine (ZYPREXA) tablet 5 mg   PTA Medications  Medication Sig   Multiple Vitamin (MULTIVITAMIN WITH MINERALS) TABS tablet Take 1 tablet by mouth daily.   calcium acetate (PHOSLO) 667 MG capsule Take by mouth 3 (three) times daily with meals.   cholecalciferol (VITAMIN D) 1000 units tablet Take 1,000 Units by mouth daily.      Medical Decision Making  Patient brought in under IVC.She is exhibiting manic behavior and is recommended for IP admission.    [facility] notified and there is no bed availability. Patient  will be admitted to the continuous assessment unit while awaiting IP bed availability.     Recommendations  Based on my evaluation the patient does not appear to have an emergency medical condition.  Recommended for IP admission. [facility] notified and there is no bed  availability. Patient  will be admitted to the continuous assessment unit while awaiting IP bed availability.   Medications: Zyprexa 5 mg now Zyprexa 5 mg at bedtime  Lab Orders         CBC with Differential/Platelet         Comprehensive metabolic panel         Hemoglobin A1c         Magnesium  Ethanol         Lipid panel         TSH         Urinalysis, Complete w Microscopic -Urine, Clean Catch         POCT Urine Drug Screen - (I-Screen)      EKG   Ardis Hughs, NP 09/06/23  6:29 PM

## 2023-09-06 NOTE — Progress Notes (Signed)
Pt is admitted to [facility] due to manic episode. Pt is alert and oriented X3. Pt is tangential  and preoccupied. Pt is ambulatory and is oriented to staff/unit. Pt was cooperative with lab and skin assessment. Pt denies pain and current SI/HI/AVH, plan or intent. Staff will monitor for pt's safety.

## 2023-09-07 ENCOUNTER — Inpatient Hospital Stay (HOSPITAL_COMMUNITY)
Admission: AD | Admit: 2023-09-07 | Discharge: 2023-09-20 | DRG: 885 | Disposition: A | Discharge status: Home / Self Care | Payer: PPO | Source: Intra-hospital | Attending: Psychiatry | Admitting: Psychiatry

## 2023-09-07 ENCOUNTER — Encounter (HOSPITAL_COMMUNITY): Payer: Self-pay | Admitting: Registered Nurse

## 2023-09-07 ENCOUNTER — Other Ambulatory Visit: Payer: Self-pay

## 2023-09-07 ENCOUNTER — Inpatient Hospital Stay (HOSPITAL_COMMUNITY): Admission: AD | Admit: 2023-09-07 | Payer: PPO | Source: Intra-hospital | Admitting: Psychiatry

## 2023-09-07 DIAGNOSIS — F4310 Post-traumatic stress disorder, unspecified: Secondary | ICD-10-CM | POA: Diagnosis present

## 2023-09-07 DIAGNOSIS — F3110 Bipolar disorder, current episode manic without psychotic features, unspecified: Principal | ICD-10-CM | POA: Diagnosis present

## 2023-09-07 DIAGNOSIS — K5900 Constipation, unspecified: Secondary | ICD-10-CM | POA: Diagnosis not present

## 2023-09-07 DIAGNOSIS — E871 Hypo-osmolality and hyponatremia: Secondary | ICD-10-CM | POA: Diagnosis not present

## 2023-09-07 DIAGNOSIS — Z811 Family history of alcohol abuse and dependence: Secondary | ICD-10-CM

## 2023-09-07 DIAGNOSIS — F3113 Bipolar disorder, current episode manic without psychotic features, severe: Secondary | ICD-10-CM | POA: Diagnosis not present

## 2023-09-07 DIAGNOSIS — Z91148 Patient's other noncompliance with medication regimen for other reason: Secondary | ICD-10-CM | POA: Diagnosis not present

## 2023-09-07 DIAGNOSIS — Z818 Family history of other mental and behavioral disorders: Secondary | ICD-10-CM

## 2023-09-07 DIAGNOSIS — F312 Bipolar disorder, current episode manic severe with psychotic features: Secondary | ICD-10-CM | POA: Diagnosis not present

## 2023-09-07 DIAGNOSIS — F3112 Bipolar disorder, current episode manic without psychotic features, moderate: Secondary | ICD-10-CM | POA: Diagnosis not present

## 2023-09-07 MED ORDER — ACETAMINOPHEN 325 MG PO TABS: 650.0000 mg | ORAL_TABLET | Freq: Four times a day (QID) | ORAL | Status: DC | PRN

## 2023-09-07 MED ORDER — OLANZAPINE 5 MG PO TBDP: 5.0000 mg | ORAL_TABLET | Freq: Three times a day (TID) | ORAL | Status: DC | PRN

## 2023-09-07 MED ORDER — ADULT MULTIVITAMIN W/MINERALS CH
1.0000 | ORAL_TABLET | Freq: Every day | ORAL | Status: DC
  Administered 2023-09-07 – 2023-09-20 (×14): 1 via ORAL
  Filled 2023-09-07 (×19): qty 1

## 2023-09-07 MED ORDER — TRAZODONE HCL 50 MG PO TABS
50.0000 mg | ORAL_TABLET | Freq: Every evening | ORAL | Status: DC | PRN
  Filled 2023-09-07: qty 1

## 2023-09-07 MED ORDER — OLANZAPINE 5 MG PO TABS
5.0000 mg | ORAL_TABLET | Freq: Every day | ORAL | Status: DC
  Administered 2023-09-07 – 2023-09-08 (×2): 5 mg via ORAL
  Filled 2023-09-07 (×4): qty 1

## 2023-09-07 MED ORDER — ALUM & MAG HYDROXIDE-SIMETH 200-200-20 MG/5ML PO SUSP
30.0000 mL | ORAL | Status: DC | PRN
Start: 2023-09-07 — End: 2023-09-20

## 2023-09-07 MED ORDER — MAGNESIUM HYDROXIDE 400 MG/5ML PO SUSP
30.0000 mL | Freq: Every day | ORAL | Status: DC | PRN
Start: 2023-09-07 — End: 2023-09-20
  Administered 2023-09-18: 30 mL via ORAL
  Filled 2023-09-07: qty 30

## 2023-09-07 MED ORDER — VITAMIN D3 25 MCG PO TABS
1000.0000 [IU] | ORAL_TABLET | Freq: Every day | ORAL | Status: DC
  Administered 2023-09-07 – 2023-09-20 (×14): 1000 [IU] via ORAL
  Filled 2023-09-07 (×18): qty 1

## 2023-09-07 MED ORDER — HYDROXYZINE HCL 25 MG PO TABS: 25.0000 mg | ORAL_TABLET | Freq: Three times a day (TID) | ORAL | Status: DC | PRN

## 2023-09-07 NOTE — Progress Notes (Signed)
Pt is awake, alert and oriented X3. Pt continues to be pleasantly manic, tangential and disorganized in speech. Pt did not voice any complaints of pain or discomfort. No signs of acute distress noted. Pt denies current SI/HI/AVH, plan or intent. Staff will monitor for pt's safety.

## 2023-09-07 NOTE — ED Notes (Signed)
Report given to [place], [place]

## 2023-09-07 NOTE — Progress Notes (Signed)
Pt has been accepted to. [facility] on 09/07/2023 Bed assignment: 401-1  Pt meets inpatient criteria per: Assunta Found NP   Attending Physician will be: Phineas Inches, MD   Report can be called to: :Adult unit: [phone]  Pt can arrive after Discharges   Care Team Notified: [facility] Rona Ravens RN, Assunta Found NP, [place] Noe Fiscus RN      [place] Cela Newcom LCSW-A   09/07/2023 10:28 AM

## 2023-09-07 NOTE — ED Provider Notes (Signed)
FBC/OBS ASAP Discharge Summary  Date and Time: 09/07/2023 12:05 PM  Name: Michelle Levy  MRN:  [id number]   Discharge Diagnoses:  Final diagnoses:  Bipolar disorder, current episode manic without psychotic features, severe [facility])    Subjective: Patient is a 67 year old female was brought in IVC by [facility] police for manic behavior. Patient reports that she is not sleeping well at night, writing on her Facebook rapidly and posting that she has had a breakthrough, believes God is speaking to her and believes that [name] is coming.  Patient states that she discontinued medications for bipolar disorder after inpatient psychiatric admission in 2018.  She states that she has not been taking medications for 8 years, moved from 1 apartment to another as she feels people are not acknowledging her, has change churches multiple times to find a god.  [name] [phone] patient's daughter/petitioner. She is concerned for patients safety. Reports patient's condition has progressively declined over the past few weeks.  She does not believe that patient has been on medications since the first Trump election in 2017 at that time patient had a breakdown and had to be psychiatrically hospitalized.  States after this election 2024 she has become more manic.  Over the past 2 days she has made over 50 Facebook post about various things but mostly religion.  One of her last post stated, "it is done and I can rest now".  Patient's daughter became concerned for her safety as she does have a firearm in the home.  Daughter is securing weapon. States one of patient's friends went to her home and the house is in disarray which is not characteristic of patient as she is normally a very organized person. 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      Stay Summary: Patient on presentation, was manic, tangential in speech, jumping from 1 topic to another, needed redirection.  Patient was started on Zyprexa 5 mg  to help stabilize her mood, and help her sleep.  Patient had not slept for days, her pace was in disarray. Patient is being discharged to [facility] behavioral health hospital for inpatient stabilization and treatment.  Patient is under IVC  Total Time spent with patient: 30 minutes  Past Psychiatric History: Patient has been hospitalized at [facility] in 2017, was diagnosed with bipolar disorder, followed up with Dr. Lolly Mustache for a few months after her discharge, has not been taking psychotropic medications since then.  She also has a history of alcohol use disorder. 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  Tobacco Cessation:  N/A, patient does not currently use tobacco products  Current Medications:  Current Facility-Administered Medications  Medication Dose Route Frequency Provider Last Rate Last Admin   acetaminophen (TYLENOL) tablet 650 mg  650 mg Oral Q6H PRN Ardis Hughs, NP       alum & mag hydroxide-simeth (MAALOX/MYLANTA) 200-200-20 MG/5ML suspension 30 mL  30 mL Oral Q4H PRN Ardis Hughs, NP       hydrOXYzine (ATARAX) tablet 25 mg  25 mg Oral TID PRN Ardis Hughs, NP       magnesium hydroxide (MILK OF MAGNESIA) suspension 30 mL  30 mL Oral Daily PRN Ardis Hughs, NP       OLANZapine (ZYPREXA) tablet 5 mg  5 mg  Oral QHS Ardis Hughs, NP   5 mg at 09/06/23 2124   traZODone (DESYREL) tablet 50 mg  50 mg Oral QHS PRN Ardis Hughs, NP       Current Outpatient Medications  Medication Sig Dispense Refill   calcium acetate (PHOSLO) 667 MG capsule Take by mouth 3 (three) times daily with meals.     cholecalciferol (VITAMIN D) 1000 units tablet Take 1,000 Units by mouth daily.     ibuprofen (ADVIL) 200 MG tablet  Take 200 mg by mouth every 6 (six) hours as needed for mild pain (pain score 1-3).     Multiple Vitamin (MULTIVITAMIN WITH MINERALS) TABS tablet Take 1 tablet by mouth daily. 30 tablet 0    PTA Medications:  Facility Ordered Medications  Medication   [COMPLETED] OLANZapine zydis (ZYPREXA) 5 MG disintegrating tablet   acetaminophen (TYLENOL) tablet 650 mg   alum & mag hydroxide-simeth (MAALOX/MYLANTA) 200-200-20 MG/5ML suspension 30 mL   magnesium hydroxide (MILK OF MAGNESIA) suspension 30 mL   traZODone (DESYREL) tablet 50 mg   hydrOXYzine (ATARAX) tablet 25 mg   OLANZapine (ZYPREXA) tablet 5 mg   PTA Medications  Medication Sig   Multiple Vitamin (MULTIVITAMIN WITH MINERALS) TABS tablet Take 1 tablet by mouth daily.   calcium acetate (PHOSLO) 667 MG capsule Take by mouth 3 (three) times daily with meals.   cholecalciferol (VITAMIN D) 1000 units tablet Take 1,000 Units by mouth daily.        No data to display          Flowsheet Row ED from 09/06/2023 in [facility]  C-SSRS RISK CATEGORY No Risk       Musculoskeletal  Strength & Muscle Tone: within normal limits Gait & Station: normal Patient leans: N/A  Psychiatric Specialty Exam  Presentation  General Appearance:  Casual  Eye Contact: Fleeting  Speech: Clear and Coherent; Pressured  Speech Volume: Increased  Handedness: Right   Mood and Affect  Mood: Anxious  Affect: Congruent   Thought Process  Thought Processes: Coherent  Descriptions of Associations:Intact  Orientation:Full (Time, Place and Person)  Thought Content:Rumination; Tangential  Diagnosis of Schizophrenia or Schizoaffective disorder in past: No  Duration of Psychotic Symptoms: Less than six months   Hallucinations:Hallucinations: None  Ideas of Reference:None  Suicidal Thoughts:Suicidal Thoughts: No  Homicidal Thoughts:Homicidal Thoughts: No   Sensorium  Memory: Immediate Fair; Recent  Fair; Remote Fair  Judgment: Poor  Insight: Poor   Executive Functions  Concentration: Fair  Attention Span: Fair  Recall: [employer] of Knowledge: Fair  Language: Fair   Psychomotor Activity  Psychomotor Activity: Psychomotor Activity: Normal   Assets  Assets: Housing; Leisure Time; Physical Health; Resilience; Social Support   Sleep  Sleep: Sleep: Poor Number of Hours of Sleep: 0   Nutritional Assessment (For OBS and FBC admissions only) Has the patient had a weight loss or gain of 10 pounds or more in the last 3 months?: No Has the patient had a decrease in food intake/or appetite?: No Does the patient have dental problems?: No Does the patient have eating habits or behaviors that may be indicators of an eating disorder including binging or inducing vomiting?: No Has the patient recently lost weight without trying?: 2.0 Has the patient been eating poorly because of a decreased appetite?: 0 Malnutrition Screening Tool Score: 2    Physical Exam  Physical Exam ROS Blood pressure (!) 152/72, pulse 80, temperature 97.9 F (36.6 C), temperature source Oral,  resp. rate 19, SpO2 100%. There is no height or weight on file to calculate BMI.  Demographic Factors:  Caucasian and Living alone  Loss Factors: NA  Historical Factors: Family history of mental illness or substance abuse and Impulsivity  Risk Reduction Factors:   Sense of responsibility to family, Religious beliefs about death, and Positive social support  Continued Clinical Symptoms:  Bipolar Disorder:   Mixed State  Cognitive Features That Contribute To Risk:  Closed-mindedness    Suicide Risk:  Minimal: No identifiable suicidal ideation.  Patients presenting with no risk factors but with morbid ruminations; may be classified as minimal risk based on the severity of the depressive symptoms  Plan Of Care/Follow-up recommendations:  Activity:  as tolerated Diet:  heart healthy  diet Other:  Patient needs inpatient psychiatric care for stabilization and treatment  Disposition: Patient being admitted to [facility] health hospital for stabilization and treatment  Nelly Rout, MD 09/07/2023, 12:05 PM

## 2023-09-07 NOTE — Plan of Care (Signed)
  Problem: Activity: Goal: Interest or engagement in activities will improve Outcome: Progressing   Problem: Safety: Goal: Periods of time without injury will increase Outcome: Progressing   

## 2023-09-07 NOTE — ED Notes (Signed)
Pt here IVC. Pt denies SI, HI, AVH and pain. Pt denies anxiety and depression. Pt is religiously preoccupied and tangential in speech. Says she lives with her 3 dogs and she has 2 children who are supportive. "They don't understand what is going on with me. I am trying to be peaceful. I just want to help those who want to be helped. I was at the hair salon today. I was 15 minutes late. They didn't understand what I was going through and then the police came. I went with them because I trust them." Pt states that she stopped taking psychiatric meds because they didn't make her feel good. Pt states she drinks about 2 1/2 glasses of wine a day. Says her last drink was 3 days ago. Reports she last slept 2 days ago. Pt is intrusive but can be redirected. Pt is in Adult Bed 6.

## 2023-09-07 NOTE — ED Notes (Signed)
Michelle Levy transferred to [facility] per NP order. Discussed with the patient and all questions fully answered. An EMTALA and Med Necessity forms were printed and to be given to the receiving nurse. All belongings returned.  Patient escorted out and transferred via [employer].  Dickie La  09/07/2023 3:16 PM

## 2023-09-07 NOTE — Progress Notes (Signed)
   09/07/23 2105  Psych Admission Type (Psych Patients Only)  Admission Status Involuntary  Psychosocial Assessment  Patient Complaints Anxiety  Eye Contact Fair  Facial Expression Animated  Affect Anxious  Speech Tangential  Interaction Assertive  Motor Activity Restless  Appearance/Hygiene Unremarkable  Behavior Characteristics Cooperative  Mood Anxious;Pleasant  Thought Process  Coherency Disorganized;Tangential  Content Religiosity;Preoccupation  Delusions None reported or observed  Perception WDL  Hallucination None reported or observed  Judgment Impaired  Confusion None  Danger to Self  Current suicidal ideation? Denies  Agreement Not to Harm Self Yes  Description of Agreement verbal  Danger to Others  Danger to Others None reported or observed

## 2023-09-07 NOTE — [facility] Group Notes (Signed)
[facility] Group Notes:  (Nursing/MHT/Case Management/Adjunct)  Date:  09/07/2023  Time:  8:44 PM  Type of Therapy:   NA group  Participation Level:  Active  Participation Quality:  Appropriate  Affect:  Appropriate  Cognitive:  Appropriate  Insight:  Appropriate  Engagement in Group:  Engaged  Modes of Intervention:  Education  Summary of Progress/Problems: Attended NA meeting.  Michelle Levy 09/07/2023, 8:44 PM

## 2023-09-07 NOTE — ED Notes (Signed)
Pt asleep at this hour. No apparent distress. RR even and unlabored. Monitored for safety.  

## 2023-09-07 NOTE — Tx Team (Signed)
Initial Treatment Plan 09/07/2023 6:21 PM ESSENCE HEACOCK ZOX:[id number]    PATIENT STRESSORS: Other: manic symptoms     PATIENT STRENGTHS: Active sense of humor  Capable of independent living  Communication skills  Financial means  General fund of knowledge  Supportive family/friends    PATIENT IDENTIFIED PROBLEMS:                      DISCHARGE CRITERIA:  Improved stabilization in mood, thinking, and/or behavior  PRELIMINARY DISCHARGE PLAN: Return to previous living arrangement  PATIENT/FAMILY INVOLVEMENT: This treatment plan has been presented to and reviewed with the patient, Michelle Levy.  The patient has been given the opportunity to ask questions and make suggestions.  Edwyna Perfect, RN 09/07/2023, 6:21 PM

## 2023-09-07 NOTE — Progress Notes (Signed)
Patient admitted involuntarily to [facility] for manic symptoms. Patient denies SI, HI, AVH. Per report patient is diagnosed with bipolar but has not been on any medications since 2018. Per report patient believes [name] is coming and patient has not slept in a few days and has not been eating. 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 Patient speech is tangential. Patient reports drinking 2.5 glasses of wine 5-7 nights a week. She denies tobacco and drug use. Patient has had no recent falls and is steady on her feet. Patient is a regular diet. Patient reports anxiety, agitation, and trouble concentrating. She would like a visit from the chaplin, order placed. Patient denies history of abuse. Patient reports she lives by herself and her friends are her support system. 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 She denies any trouble getting medications.   Patient blood pressure elevated. She reports she is anxious.   Patient oriented to the unit. Patient given sandwich and salad tray as well as ginger ale. Q 15 minute safety checks started. Safety maintained.

## 2023-09-07 NOTE — Discharge Instructions (Signed)
Patient is to be transferred to [facility]

## 2023-09-07 NOTE — Progress Notes (Signed)
Pt was accepted to [facility] TODAY 09/07/2023; Bed Assignment PENDING TODAY 09/07/2023 Discharges.  Michelle Levy [facility] Fax: [phone]  Address: [address] [place], [place], [place] [address]  Pt meets inpatient criteria per Julaine Fusi  Attending Physician will be Dr. Shellee Milo  Report can be called to: [phone]  Pt can arrive after: [facility] will coordinate with care team.   Care Team notified: Night [facility], Day [facility] Rona Ravens, RN, Darlin Priestly [place],LCSWA     Maryjean Ka, MSW, [facility] 09/07/2023 2:11 AM

## 2023-09-08 MED ORDER — WHITE PETROLATUM EX OINT
TOPICAL_OINTMENT | CUTANEOUS | Status: AC
  Administered 2023-09-08: 1
  Filled 2023-09-08: qty 5

## 2023-09-08 NOTE — Group Note (Signed)
Date:  09/08/2023 Time:  4:31 PM  Group Topic/Focus:  Coping With Mental Health Crisis:   The purpose of this group is to help patients identify strategies for coping with mental health crisis.     Participation Level:  Active  Participation Quality:  Appropriate  Affect:  Appropriate  Cognitive:  Appropriate  Insight: Appropriate  Engagement in Group:  Engaged  Modes of Intervention:  Education  Additional Comments:    Arnoldo Hooker 09/08/2023, 4:31 PM

## 2023-09-08 NOTE — Group Note (Signed)
LCSW Group Therapy Note   Group Date: 09/08/2023 Start Time: 1100 End Time: 1200   Type of Therapy and Topic:  Group Therapy: Boundaries  Participation Level:  Did Not Attend  Description of Group: This group will address the use of boundaries in their personal lives. Patients will explore why boundaries are important, the difference between healthy and unhealthy boundaries, and negative and postive outcomes of different boundaries and will look at how boundaries can be crossed.  Patients will be encouraged to identify current boundaries in their own lives and identify what kind of boundary is being set. Facilitators will guide patients in utilizing problem-solving interventions to address and correct types boundaries being used and to address when no boundary is being used. Understanding and applying boundaries will be explored and addressed for obtaining and maintaining a balanced life. Patients will be encouraged to explore ways to assertively make their boundaries and needs known to significant others in their lives, using other group members and facilitator for role play, support, and feedback.  Therapeutic Goals:  1.  Patient will identify areas in their life where setting clear boundaries could be  used to improve their life.  2.  Patient will identify signs/triggers that a boundary is not being respected. 3.  Patient will identify two ways to set boundaries in order to achieve balance in  their lives: 4.  Patient will demonstrate ability to communicate their needs and set boundaries  through discussion and/or role plays  Summary of Patient Progress:  Did not attend  Therapeutic Modalities:   Cognitive Behavioral Therapy Solution-Focused Therapy  Kathi Der, LCSWA 09/08/2023  12:12 PM

## 2023-09-08 NOTE — Group Note (Unsigned)
Date:  09/08/2023 Time:  9:18 AM  Group Topic/Focus:  Goals Group:   The focus of this group is to help patients establish daily goals to achieve during treatment and discuss how the patient can incorporate goal setting into their daily lives to aide in recovery. Orientation:   The focus of this group is to educate the patient on the purpose and policies of crisis stabilization and provide a format to answer questions about their admission.  The group details unit policies and expectations of patients while admitted.     Participation Level:  {BHH PARTICIPATION UYQIH:47425}  Participation Quality:  {BHH PARTICIPATION QUALITY:22265}  Affect:  {BHH AFFECT:22266}  Cognitive:  {BHH COGNITIVE:22267}  Insight: {BHH Insight2:20797}  Engagement in Group:  {BHH ENGAGEMENT IN ZDGLO:75643}  Modes of Intervention:  {BHH MODES OF INTERVENTION:22269}  Additional Comments:  ***  Michelle Levy D Lillard Bailon 09/08/2023, 9:18 AM

## 2023-09-08 NOTE — Plan of Care (Signed)

## 2023-09-08 NOTE — Progress Notes (Signed)
   09/08/23 0600  15 Minute Checks  Location Bedroom  Visual Appearance Calm  Behavior Composed  Sleep (Behavioral Health Patients Only)  Calculate sleep? (Click Yes once per 24 hr at 0600 safety check) Yes  Documented sleep last 24 hours 7.25

## 2023-09-08 NOTE — BHH Group Notes (Signed)
BHH Group Notes:  (Nursing/MHT/Case Management/Adjunct)  Date:  09/08/2023  Time:  10:11 PM  Type of Therapy:   Wrap-up group  Participation Level:  Active  Participation Quality:  Appropriate  Affect:  Appropriate  Cognitive:  Appropriate  Insight:  Appropriate  Engagement in Group:  Engaged  Modes of Intervention:  Education  Summary of Progress/Problems: Pt goal to learn new language. Rated day 10/10.  Michelle Levy 09/08/2023, 10:11 PM

## 2023-09-08 NOTE — Group Note (Signed)
Date:  09/08/2023 Time:  10:38 AM  Group Topic/Focus:  Goals Group:   The focus of this group is to help patients establish daily goals to achieve during treatment and discuss how the patient can incorporate goal setting into their daily lives to aide in recovery. Orientation:   The focus of this group is to educate the patient on the purpose and policies of crisis stabilization and provide a format to answer questions about their admission.  The group details unit policies and expectations of patients while admitted.    Participation Level:  Active  Participation Quality:  Appropriate  Affect:  Appropriate  Cognitive:  Appropriate  Insight: Appropriate  Engagement in Group:  Engaged  Modes of Intervention:  Discussion  Additional Comments:    Michelle Levy D Deniece Rankin 09/08/2023, 10:38 AM

## 2023-09-08 NOTE — BHH Suicide Risk Assessment (Signed)
Suicide Risk Assessment  Admission Assessment    [facility] Admission Suicide Risk Assessment   Nursing information obtained from:  Patient Demographic factors:  Age 67 or older, Divorced or widowed, Caucasian, Living alone Current Mental Status:  NA Loss Factors:  NA Historical Factors:  Family history of mental illness or substance abuse Risk Reduction Factors:  Sense of responsibility to family, Positive social support  Total Time spent with patient: 1 hour Principal Problem: Bipolar affective disorder, current episode manic (HCC) Diagnosis:  Principal Problem:   Bipolar affective disorder, current episode manic (HCC)   Subjective Data:   Per chart review, patient presented on 11/9 to [facility] under IVC by her daughter who attested that patient has a longstanding history of bipolar disorder, not on medications, and has been decompensating over the past couple of weeks.  Became manic around election time (11/5) and has made an egregious number of hyperreligious/manic statements on Facebook (that God is speaking to her, that Michelle Levy is coming", expressed paranoid thinking, disorganized thinking, reduced sleep, disorganized speech, inability to care for self.  A friend visited patient's house, and found in disarray, which is unusual for patient.  Similar episode in 2017 following Dorinda Hill Trump's first electoral victory Lexicographer of ideas/hallucinations/erratic behavior) requiring [facility] admission.  At that time she was diagnosed with bipolar disorder, severe mixed episode without psychotic features.  Resolved with Seroquel and Lamictal, both of which patient discontinued over the course of the next year.  Lost to follow-up later that year.  In the interim, patient has not taken prescribed medication for bipolar disorder and has seen no psychiatrist.   On interview, patient is a poor historian - overly bright, tangential, and hyperverbal but attentive to conversation.  Patient says that she has been  "overwhelmed with information" over the past few days since an argument with her friend.  She said that she had made a number of Facebook posts after drinking (the content of which she cannot remember).  Her family expressed concern over these posts, which puzzled her-she attributes their concern to her use of "extra emojis".  Patient is difficult to keep on track.  Was told that she has a diagnosis of bipolar disorder, but does not take any medications except supplements and a multivitamin.     Patient attests that sleep and appetite are normal. (Per chart review, patient has not been sleeping or eating.) denies SI/HI.  When asked about visual hallucinations, patient endorses reading "body language".  Also endorses some paranoid/suspicious thinking, but is unable to coherently explain what she means by this. When asked what her goals are for at this hospital stay she said "to learn a new language" of different symbols, as well as body language.  Denies feeling depressed recently, except in response to Trump's recent re-election.  Is not overly anxious at baseline.  Endorsed history of trauma - positive for the following PTSD symptoms: prior stressor, intrusions, hyperarousal, and nightmares.  At one point became tearful during the interview, and discussed previous relationship with her ex-husband and the loss of an early pregnancy.  Endorses recent alcohol use, last use ~ 11/17, 2-2.5 glasses of wine per day.  (Per chart review, patient had consumed up to 3 bottles a day in the past -- patient references seeking out community support in Al-Anon/lighthouse programs.) Denied other substance use.    Denies having any medical diagnoses.  Irregular PCP follow-up.  Surgeries include: Cesarean section, dilation and curettage.  Denies history of seizure.  Denies history of traumatic  brain injury.  No allergies.   Lives with self.  Social support includes: 2 children.  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   Family psychiatric history is significant for: Sister has bipolar disorder, mother has depression, grandson has ADHD.   Collateral information via daughter, Michelle Levy [phone]): Per daughter, when things get bad patient posts a lot on Facebook.  Attributes her current manic episode to Trump being re-elected.  However, this episode has more of a hyperreligious bent.  Per daughter, patient believes that she can talk to God and that she could reach "nirvana" if she wanted to but chooses to stay to help others.  Apparently, after her pastor confronted her about her strange behavior/beliefs, she ran into a nearby forced and covered herself in leaves.  At baseline, patient works 15 to 20 hours a week and is reasonably functional.  Some decline in memory over the past few years (loses her car sometimes) but not debilitating.  Daughter confirms history of alcohol use, but drinks "moderately" now (2 - 2.5 glasses a day.) Has never voiced suicidal thinking, but believes that she is feeling down secondary to losing a close relationship to a friend.  Confirms that she has removed the firearm from patient's house and secured.  No other safety concerns.  Continued Clinical Symptoms:  Alcohol Use Disorder Identification Test Final Score (AUDIT): 5 The "Alcohol Use Disorders Identification Test", Guidelines for Use in Primary Care, Second Edition.  World Science writer [facility]). Score between 0-7:  no or low risk or alcohol related problems. Score between 8-15:  moderate risk of alcohol related problems. Score between 16-19:  high risk of alcohol related problems. Score 20 or above:  warrants further diagnostic evaluation for alcohol dependence and treatment.  CLINICAL FACTORS:   Bipolar Disorder:   Mixed State  Psychiatric Specialty Exam:   Presentation    General Appearance:  Appropriate for Environment; Casual   Eye Contact:  Fair   Speech:  Pressured; Clear  and Coherent   Speech Volume:  Increased   Handedness:  Right     Mood and Affect    Mood:  Euphoric; Labile; Anxious   Affect:  Labile; Congruent     Thinking     Thought Processes:  Disorganized   Descriptions of Associations:  Tangential   Orientation:  Full (Time, Place and Person)   Thought Content:  Perseveration; Tangential; Logical; Delusions   History of Schizophrenia/Schizoaffective disorder:  No     Duration of Psychotic Symptoms: ~ 2 weeks   Hallucinations:  None   Ideas of Reference:  Delusions; Paranoia   Suicidal Thoughts:  No   Homicidal Thoughts:  No     Sensorium      Memory:  Immediate Fair; Recent Fair; Remote Fair   Judgment:  Poor   Insight:  Poor     Executive Functions      Concentration:  Poor   Attention Span:  Fair   Recall:  [employer] of Knowledge:  Fair   Language:  Fair     Musculoskeletal:   Strength & muscle tone: within normal limits Gait & station: normal Patient leans: N/A   Psychomotor Activity:  Increased       Assets:  Social Support; Leisure Time; Vocational/Educational; Desire for Improvement; Financial Resources/Insurance       Sleep:  Good 0      Physical Exam Constitutional:  General: She is not in acute distress.    Appearance: Normal appearance. She is not ill-appearing.  HENT:     Head: Atraumatic.  Eyes:     Extraocular Movements: Extraocular movements intact.  Pulmonary:     Effort: Pulmonary effort is normal. No respiratory distress.  Abdominal:     General: There is no distension.  Musculoskeletal:        General: Normal range of motion.  Neurological:     Mental Status: She is alert and oriented to person, place, and time. Mental status is at baseline.   COGNITIVE FEATURES THAT CONTRIBUTE TO RISK:  Loss of executive function    SUICIDE RISK:   Minimal: No identifiable suicidal ideation.  Patients presenting with no risk factors but  with morbid ruminations; may be classified as minimal risk based on the severity of the depressive symptoms  PLAN OF CARE: See H&P for assessment and plan.   I certify that inpatient services furnished can reasonably be expected to improve the patient's condition.   Tomie China, MD 09/08/2023, 2:24 PM

## 2023-09-08 NOTE — H&P (Signed)
Psychiatric Admission Assessment Adult  Patient Identification:  Michelle Levy MRN:  [id number] Date of Evaluation:  09/08/2023 Chief Complaint:  Bipolar affective disorder, current episode manic (HCC) [F31.10] Principal Diagnosis:  Bipolar affective disorder, current episode manic (HCC) Diagnosis:  Principal Problem:   Bipolar affective disorder, current episode manic (HCC)    CC:   "Too much information"  Michelle Levy is a 67 y.o. female  with a past psychiatric history of bipolar disorder, mixed, with psychotic features, alcohol use disorder, moderate, and trauma. Patient initially arrived to [facility] on 11/19 for symptoms of mania (wrote 50 hyperreligious Facebook post over the course of 2 days), lack of sleep, disorganized thinking, disorganized speech, inability to care for self and admitted to [facility] under IVC on 11/20 for acute safety concerns, crisis stabalization, impaired functioning, and stabilization of acute on chronic psychiatric conditions. PMHx is not significant.    HPI:   Per chart review, patient presented on 11/9 to [facility] under IVC by her daughter who attested that patient has a longstanding history of bipolar disorder, not on medications, and has been decompensating over the past couple of weeks.  Became manic around election time (11/5) and has made an egregious number of hyperreligious/manic statements on Facebook (that God is speaking to her, that [name] is coming", expressed paranoid thinking, disorganized thinking, reduced sleep, disorganized speech, inability to care for self.  A friend visited patient's house, and found in disarray, which is unusual for patient.  Similar episode in 2017 following Dorinda Hill Trump's first electoral victory Lexicographer of ideas/hallucinations/erratic behavior) requiring [facility] admission.  At that time she was diagnosed with bipolar disorder, severe mixed episode without psychotic features.  Resolved with Seroquel and Lamictal, both of which  patient discontinued over the course of the next year.  Lost to follow-up later that year.  In the interim, patient has not taken prescribed medication for bipolar disorder and has seen no psychiatrist.  On interview, patient is a poor historian - overly bright, tangential, and hyperverbal but attentive to conversation.  Patient says that she has been "overwhelmed with information" over the past few days since an argument with her friend.  She said that she had made a number of Facebook posts after drinking (the content of which she cannot remember).  Her family expressed concern over these posts, which puzzled her-she attributes their concern to her use of "extra emojis".  Patient is difficult to keep on track.  Was told that she has a diagnosis of bipolar disorder, but does not take any medications except supplements and a multivitamin.    Patient attests that sleep and appetite are normal. (Per chart review, patient has not been sleeping or eating.) denies SI/HI.  When asked about visual hallucinations, patient endorses reading "body language".  Also endorses some paranoid/suspicious thinking, but is unable to coherently explain what she means by this. When asked what her goals are for at this hospital stay she said "to learn a new language" of different symbols, as well as body language.  Denies feeling depressed recently, except in response to Trump's recent re-election.  Is not overly anxious at baseline.  Endorsed history of trauma - positive for the following PTSD symptoms: prior stressor, intrusions, hyperarousal, and nightmares.  At one point became tearful during the interview, and discussed previous relationship with her ex-husband and the loss of an early pregnancy.  Endorses recent alcohol use, last use ~ 11/17, 2-2.5 glasses of wine per day.  (Per chart review, patient had consumed  up to 3 bottles a day in the past -- patient references seeking out community support in Al-Anon/lighthouse  programs.) Denied other substance use.   Denies having any medical diagnoses.  Irregular PCP follow-up.  Surgeries include: Cesarean section, dilation and curettage.  Denies history of seizure.  Denies history of traumatic brain injury.  No allergies.  Lives with self.  Social support includes: 2 children.  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  Family psychiatric history is significant for: Sister has bipolar disorder, mother has depression, grandson has ADHD.  Collateral information via daughter, [name] [phone]): Per daughter, when things get bad patient posts a lot on Facebook.  Attributes her current manic episode to Trump being re-elected.  However, this episode has more of a hyperreligious bent.  Per daughter, patient believes that she can talk to God and that she could reach "nirvana" if she wanted to but chooses to stay to help others.  Apparently, after her pastor confronted her about her strange behavior/beliefs, she ran into a nearby forced and covered herself in leaves.  At baseline, patient works 15 to 20 hours a week and is reasonably functional.  Some decline in memory over the past few years (loses her car sometimes) but not debilitating.  Daughter confirms history of alcohol use, but drinks "moderately" now (2 - 2.5 glasses a day.) Has never voiced suicidal thinking, but believes that she is feeling down secondary to losing a close relationship to a friend.  Confirms that she has removed the firearm from patient's house and secured.  No other safety concerns.   Psychiatric ROS:  As above.  Past Psychiatric History: Current psychiatrist: none. Current therapist: Previously saw pastor at her church, counselor with ex-husband Previous psychiatric diagnoses: Bipolar disorder, mixed, with psychotic features Current psychiatric medications: denied. Psychiatric medication history/compliance:  Started on Lamictal/Seroquel in 2017,  stopped approximately 1 year afterwards, did not resume. Psychiatric hospitalization(s):  2017, after a manic episode with psychosis similar to current presentation p. Psychotherapy history: none. Neuromodulation history: none. History of suicide (obtained from HPI): none. History of homicide or aggression (obtained in HPI): none.  Substance Abuse History: Alcohol:  History of alcohol use disorder, moderate, as much as 2-3 bottles of wine in the past for 10+ years-now drinks "2.5 glasses of wine" a day, last drink last week.  Has previously sought out Al-Anon meetings. Tobacco: denied. Cannabis: denied. IV drug use: denied. Prescription drug use: denied. Other illicit drugs: denied. Rehab history: denied.  Past Medical History: PCP:  Sees PCP irregularly. Medical diagnoses: None Medications: Patient takes no prescribed medications -- Allergies: none. Hospitalizations:  None. Surgeries:  C-section, D&C. Trauma: denied. Seizures: denied.  Social History: Living situation:  Lives with self. 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  Violence/aggression: none. Substance use history:  EtOH-dad.  Family Medical History: None pertinent  Total Time spent with patient: 1 hour  Is the patient at risk to self? No.  Has the patient been a risk to self in the past 6 months? No.  Has the patient been a risk to self within the distant past? No.   Is the patient a risk to others? No.  Has the  patient been a risk to others in the past 6 months? No.  Has the patient been a risk to others within the distant past? No.   [place] Scale:  Flowsheet  Row [facility] (Current[facility] from [facility]23 in [facility]  C-SSRS RISK CATEGORY No Risk No Risk        Tobacco Screening:  Social History   Tobacco Use  Smoking Status Never  Smokeless Tobacco Never    BH Tobacco Counseling     Are you interested in Tobacco Cessation Medications?  No, patient refused Counseled patient on smoking cessation:  Refused/Declined practical counseling Reason Tobacco Screening Not Completed: Patient Refused Screening       Social History:  Social History   Substance and Sexual Activity  Alcohol Use No   Alcohol/week: 0.0 standard drinks of alcohol     Social History   Substance and Sexual Activity  Drug Use No    Additional Social History:      Allergies:  No Known Allergies Lab Results:  Results for orders placed or performed during the hospital encounter of 09/06/23 (from the past 48 hour(s))  CBC with Differential/Platelet     Status: None   Collection Time: 09/06/23  3:55 PM  Result Value Ref Range   WBC 10.2 4.0 - 10.5 K/uL   RBC 4.24 3.87 - 5.11 MIL/uL   Hemoglobin 13.2 12.0 - 15.0 g/dL   HCT 16.1 09.6 - 04.5 %   MCV 88.2 80.0 - 100.0 fL   MCH 31.1 26.0 - 34.0 pg   MCHC 35.3 30.0 - 36.0 g/dL   RDW 40.9 81.1 - 91.4 %   Platelets 317 150 - 400 K/uL   nRBC 0.0 0.0 - 0.2 %   Neutrophils Relative % 68 %   Neutro Abs 6.9 1.7 - 7.7 K/uL   Lymphocytes Relative 23 %   Lymphs Abs 2.3 0.7 - 4.0 K/uL   Monocytes Relative 9 %   Monocytes Absolute 0.9 0.1 - 1.0 K/uL   Eosinophils Relative 0 %   Eosinophils Absolute 0.0 0.0 - 0.5 K/uL   Basophils Relative 0 %   Basophils Absolute 0.0 0.0 - 0.1 K/uL   Immature Granulocytes 0 %   Abs Immature Granulocytes 0.03 0.00 - 0.07 K/uL    Comment: Performed at [facility], [address] [place], [place] [address]  Comprehensive metabolic panel     Status: Abnormal   Collection Time: 09/06/23   3:55 PM  Result Value Ref Range   Sodium 125 (L) 135 - 145 mmol/L   Potassium 4.2 3.5 - 5.1 mmol/L   Chloride 91 (L) 98 - 111 mmol/L   CO2 23 22 - 32 mmol/L   Glucose, Bld 138 (H) 70 - 99 mg/dL    Comment: Glucose reference range applies only to samples taken after fasting for at least 8 hours.   BUN 12 8 - 23 mg/dL   Creatinine, Ser 6.21 0.44 - 1.00 mg/dL   Calcium 8.8 (L) 8.9 - 10.3 mg/dL   Total Protein 6.3 (L) 6.5 - 8.1 g/dL   Albumin 3.7 3.5 - 5.0 g/dL   AST 36 15 - 41 U/L   ALT 22 0 - 44 U/L   Alkaline Phosphatase 73 38 - 126 U/L   Total Bilirubin 0.9 <1.2 mg/dL   GFR, Estimated >30 >86 mL/min    Comment: (NOTE) Calculated using the CKD-EPI Creatinine Equation (2021)  Anion gap 11 5 - 15    Comment: Performed at [facility], [address] [place], [place] [address]  Hemoglobin A1c     Status: None   Collection Time: 09/06/23  3:55 PM  Result Value Ref Range   Hgb A1c MFr Bld 5.3 4.8 - 5.6 %    Comment: (NOTE) Pre diabetes:          5.7%-6.4%  Diabetes:              >6.4%  Glycemic control for   <7.0% adults with diabetes    Mean Plasma Glucose 105.41 mg/dL    Comment: Performed at [facility], [address], [place], [place] [address]  Magnesium     Status: None   Collection Time: 09/06/23  3:55 PM  Result Value Ref Range   Magnesium 2.1 1.7 - 2.4 mg/dL    Comment: Performed at [facility], [address] [place], [place] [address]  Ethanol     Status: None   Collection Time: 09/06/23  3:55 PM  Result Value Ref Range   Alcohol, Ethyl (B) <10 <10 mg/dL    Comment: (NOTE) Lowest detectable limit for serum alcohol is 10 mg/dL.  For medical purposes only. Performed at [facility], [address], [place], [place] [address]   Lipid panel     Status: None   Collection Time: 09/06/23  3:55 PM  Result Value Ref Range   Cholesterol 198 0 - 200 mg/dL   Triglycerides 29 <295 mg/dL   HDL 621 >30 mg/dL   Total CHOL/HDL Ratio 1.9  RATIO   VLDL 6 0 - 40 mg/dL   LDL Cholesterol 85 0 - 99 mg/dL    Comment:        Total Cholesterol/HDL:CHD Risk Coronary Heart Disease Risk Table                     Men   Women  1/2 Average Risk   3.4   3.3  Average Risk       5.0   4.4  2 X Average Risk   9.6   7.1  3 X Average Risk  23.4   11.0        Use the calculated Patient Ratio above and the CHD Risk Table to determine the patient's CHD Risk.        ATP III CLASSIFICATION (LDL):  <100     mg/dL   Optimal  865-784  mg/dL   Near or Above                    Optimal  130-159  mg/dL   Borderline  696-295  mg/dL   High  >284     mg/dL   Very High Performed at [facility], [address] [place], [place] [address]   TSH     Status: None   Collection Time: 09/06/23  3:55 PM  Result Value Ref Range   TSH 1.128 0.350 - 4.500 uIU/mL    Comment: Performed by a 3rd Generation assay with a functional sensitivity of <=0.01 uIU/mL. Performed at [facility], [address], [place], [place] [address]   Urinalysis, Complete w Microscopic -Urine, Clean Catch     Status: Abnormal   Collection Time: 09/06/23  3:55 PM  Result Value Ref Range   Color, Urine YELLOW YELLOW   APPearance CLEAR CLEAR   Specific  Gravity, Urine 1.006 1.005 - 1.030   pH 7.0 5.0 - 8.0   Glucose, UA 50 (A) NEGATIVE mg/dL   Hgb urine dipstick NEGATIVE NEGATIVE   Bilirubin Urine NEGATIVE NEGATIVE   Ketones, ur 5 (A) NEGATIVE mg/dL   Protein, ur NEGATIVE NEGATIVE mg/dL   Nitrite NEGATIVE NEGATIVE   Leukocytes,Ua NEGATIVE NEGATIVE   RBC / HPF 0-5 0 - 5 RBC/hpf   WBC, UA 0-5 0 - 5 WBC/hpf   Bacteria, UA NONE SEEN NONE SEEN   Squamous Epithelial / HPF 0-5 0 - 5 /HPF    Comment: Performed at [facility], [address], [place], [place] [address]  POCT Urine Drug Screen - (I-Screen)     Status: Normal   Collection Time: 09/06/23  4:00 PM  Result Value Ref Range   POC Amphetamine UR None Detected NONE DETECTED (Cut Off Level 1000 ng/mL)    POC Secobarbital (BAR) None Detected NONE DETECTED (Cut Off Level 300 ng/mL)   POC Buprenorphine (BUP) None Detected NONE DETECTED (Cut Off Level 10 ng/mL)   POC Oxazepam (BZO) None Detected NONE DETECTED (Cut Off Level 300 ng/mL)   POC Cocaine UR None Detected NONE DETECTED (Cut Off Level 300 ng/mL)   POC Methamphetamine UR None Detected NONE DETECTED (Cut Off Level 1000 ng/mL)   POC Morphine None Detected NONE DETECTED (Cut Off Level 300 ng/mL)   POC Methadone UR None Detected NONE DETECTED (Cut Off Level 300 ng/mL)   POC Oxycodone UR None Detected NONE DETECTED (Cut Off Level 100 ng/mL)   POC Marijuana UR None Detected NONE DETECTED (Cut Off Level 50 ng/mL)    Blood alcohol level:  Lab Results  Component Value Date   ETH <10 09/06/2023   ETH 159 (H) 11/20/2015    Metabolic disorder labs:  Lab Results  Component Value Date   HGBA1C 5.3 09/06/2023   MPG 105.41 09/06/2023   MPG 108 11/22/2015   Lab Results  Component Value Date   PROLACTIN 14.9 11/22/2015   Lab Results  Component Value Date   CHOL 198 09/06/2023   TRIG 29 09/06/2023   HDL 107 09/06/2023   CHOLHDL 1.9 09/06/2023   VLDL 6 09/06/2023   LDLCALC 85 09/06/2023   LDLCALC 101 (H) 11/22/2015    Current Medications: Current Facility-Administered Medications  Medication Dose Route Frequency Provider Last Rate Last Admin   acetaminophen (TYLENOL) tablet 650 mg  650 mg Oral Q6H PRN Rankin, Shuvon B, NP       alum & mag hydroxide-simeth (MAALOX/MYLANTA) 200-200-20 MG/5ML suspension 30 mL  30 mL Oral Q4H PRN Rankin, Shuvon B, NP       hydrOXYzine (ATARAX) tablet 25 mg  25 mg Oral TID PRN Rankin, Shuvon B, NP       magnesium hydroxide (MILK OF MAGNESIA) suspension 30 mL  30 mL Oral Daily PRN Rankin, Shuvon B, NP       multivitamin with minerals tablet 1 tablet  1 tablet Oral Daily Rankin, Shuvon B, NP   1 tablet at 09/08/23 0744   OLANZapine (ZYPREXA) tablet 5 mg  5 mg Oral QHS Rankin, Shuvon B, NP   5 mg at 09/07/23  2157   OLANZapine zydis (ZYPREXA) disintegrating tablet 5 mg  5 mg Oral TID PRN Rankin, Shuvon B, NP       traZODone (DESYREL) tablet 50 mg  50 mg Oral QHS PRN Rankin, Shuvon B, NP       vitamin D3 (CHOLECALCIFEROL) tablet 1,000 Units  1,000 Units Oral Daily Rankin, Shuvon B, NP   1,000 Units at 09/08/23 0756    PTA Medications: Medications Prior to Admission  Medication Sig Dispense Refill Last Dose   B Complex-C (B-COMPLEX WITH VITAMIN C) tablet Take 1 tablet by mouth daily.      calcium carbonate (OS-CAL - DOSED IN MG OF ELEMENTAL CALCIUM) 1250 (500 Ca) MG tablet Take 1 tablet by mouth daily with breakfast.      Collagen-Vitamin C-Biotin (COLLAGEN PO) Take 1 Units by mouth daily.      magnesium oxide (MAG-OX) 400 (240 Mg) MG tablet Take 400 mg by mouth daily.      omega-3 acid ethyl esters (LOVAZA) 1 g capsule Take 1 g by mouth daily.      cholecalciferol (VITAMIN D) 1000 units tablet Take 1,000 Units by mouth daily.      Multiple Vitamin (MULTIVITAMIN WITH MINERALS) TABS tablet Take 1 tablet by mouth daily. 30 tablet 0     Psychiatric Specialty Exam:  Presentation   General Appearance:  Appropriate for Environment; Casual  Eye Contact:  Fair  Speech:  Pressured; Clear and Coherent  Speech Volume:  Increased  Handedness:  Right   Mood and Affect   Mood:  Euphoric; Labile; Anxious  Affect:  Labile; Congruent   Thinking   Thought Processes:  Disorganized  Descriptions of Associations:  Tangential  Orientation:  Full (Time, Place and Person)  Thought Content:  Perseveration; Tangential; Logical; Delusions  History of Schizophrenia/Schizoaffective disorder:  No   Duration of Psychotic Symptoms: ~ 2 weeks  Hallucinations:  None  Ideas of Reference:  Delusions; Paranoia  Suicidal Thoughts:  No  Homicidal Thoughts:  No   Sensorium    Memory:  Immediate Fair; Recent Fair; Remote Fair  Judgment:  Poor  Insight:  Poor   Executive  Functions    Concentration:  Poor  Attention Span:  Fair  Recall:  [employer] of Knowledge:  Fair  Language:  Fair   Musculoskeletal:  Strength & muscle tone: within normal limits Gait & station: normal Patient leans: N/A  Psychomotor Activity:  Increased    Assets:  Social Support; Leisure Time; Vocational/Educational; Desire for Improvement; Financial Resources/Insurance    Sleep:  Good 0    Physical Exam Constitutional:      General: She is not in acute distress.    Appearance: Normal appearance. She is not ill-appearing.  HENT:     Head: Atraumatic.  Eyes:     Extraocular Movements: Extraocular movements intact.  Pulmonary:     Effort: Pulmonary effort is normal. No respiratory distress.  Abdominal:     General: There is no distension.  Musculoskeletal:        General: Normal range of motion.  Neurological:     Mental Status: She is alert and oriented to person, place, and time. Mental status is at baseline.    Review of Systems  Constitutional:  Negative for chills and fever.  Cardiovascular:  Negative for chest pain and palpitations.  All other systems reviewed and are negative.  Blood pressure (!) 141/81, pulse 99, temperature 98.2 F (36.8 C), temperature source Oral, resp. rate 16, height 5\' 3"  (1.6 m), weight 56.2 kg, SpO2 100%. Body mass index is 21.97 kg/m.   Treatment Plan Summary: Daily contact with patient to assess and evaluate symptoms and progress in treatment and Medication management   ASSESSMENT:   Minaal Bommer is a 67 year old female with a past psychiatric history of bipolar 1  disorder, manic, requiring hospitalization; alcohol use disorder, without withdrawal symptoms; and PTSD who was admitted to [facility] for an acute manic episode, with questionable psychotic features.  Patient clearly presents in a state of acute mania presenting with the following symptoms: Disorganized speech, flight of ideas, increased energy,  expansive mood, grandiosity, pressured speech.  Per collateral/chart review, it appears the patient believed that she can speak to God, has been chosen to help others on earth even though she could, at any time, ascend to "nirvana."  May also be experiencing visual hallucinations -- could conceivably be features of smoldering psychosis.  Per daughter, patient has a reasonably functional baseline, works 15 to 20 hours a week, can function by herself.  Occasionally becomes fixated on certain interests, and is religious, but nothing like this.  Also of note, it appears patient has a history of alcohol use disorder and now drinks approximately 2-1/2 glasses of wine a day.  Patient has an extensive trauma history, which we will defer to outpatient treatment.  Diagnoses / Active Problems: Bipolar 1 disorder, in manic episode, with psychotic features Alcohol use disorder Posttraumatic stress disorder  PLAN:  Safety and Monitoring: - INVOLUNTARY  admission to inpatient psychiatric unit for safety, stabilization and treatment. - Daily contact with patient to assess and evaluate symptoms and progress in treatment - Patient's case to be discussed in multi-disciplinary team meeting -  Observation Level : q15 minute checks -  Vital signs:  q12 hours -  Precautions: suicide, elopement, and assault  2. Psychiatric Diagnoses and Treatment:    # Bipolar 1 disorder, and manic episode, with psychotic features # Alcohol use disorder # PTSD - Continue Zyprexa 5 mg nightly - The risks/benefits/side-effects/alternatives to this medication were discussed in detail with the patient and time was given for questions. The patient consents to medication trial.  - Metabolic profile and EKG monitoring obtained while on an atypical antipsychotic  BMI: 21.97 TSH: 1.128 Lipid panel: LDL 85 HbgA1c: 5.3 QTc: 415 - Encouraged patient to participate in unit milieu and in scheduled group therapies  - Short Term Goals:  Ability to identify changes in lifestyle to reduce recurrence of condition will improve, Ability to verbalize feelings will improve, Ability to disclose and discuss suicidal ideas, Ability to maintain clinical measurements within normal limits will improve, and Compliance with prescribed medications will improve - Long Term Goals: Improvement in symptoms so as ready for discharge  Other PRNS: Agitation, constipation, sleep  Other labs reviewed on admission:    3. Medical Issues Being Addressed: None  4. Discharge Planning:   - Estimated discharge date: 09/15/23 - Social work and case management to assist with discharge planning and identification of hospital follow-up needs prior to discharge. - Discharge concerns: Need to establish a safety plan; medication compliance and effectiveness. - Discharge goals: Return home with outpatient referrals for mental health follow-up including medication management/psychotherapy.  I certify that inpatient services furnished can reasonably be expected to improve the patient's condition.    Luiz Iron, MD PGY-1, Psychiatry Residency  11/21/202411:52 AM

## 2023-09-09 ENCOUNTER — Encounter (HOSPITAL_COMMUNITY): Payer: Self-pay

## 2023-09-09 MED ORDER — OLANZAPINE 7.5 MG PO TABS
7.5000 mg | ORAL_TABLET | Freq: Every day | ORAL | Status: DC
  Administered 2023-09-09 – 2023-09-10 (×2): 7.5 mg via ORAL
  Filled 2023-09-09 (×3): qty 1

## 2023-09-09 NOTE — Progress Notes (Signed)
Chaplain met with Michelle Levy at her request.  Michelle Levy shared about her experience being a break through rather than a break down like she had during her last hospitalization. She has good support from family, friends and her church community.  She is a very spiritual person.  Chaplain did not note hyper-religiosity, though her speech was very rapid and she reported still having racing thoughts. Chaplain provided prayer and emotional support.  She was very Adult nurse.   [address], Bcc Pager, [phone]

## 2023-09-09 NOTE — Plan of Care (Signed)
Problem: Education: Goal: Emotional status will improve Outcome: Not Progressing Goal: Mental status will improve Outcome: Not Progressing   Problem: Activity: Goal: Interest or engagement in activities will improve Outcome: Progressing

## 2023-09-09 NOTE — BH Specialist Note (Signed)
CSW spoke with friend Penelope Coop, patient has a gun in the home. Patient's daughter will remove the gun from the home.  Patient reports that the gun is in a lockbox under her bed.   Nolon Rod, LCSW

## 2023-09-09 NOTE — Plan of Care (Signed)

## 2023-09-09 NOTE — Progress Notes (Signed)
   09/08/23 2109  Psych Admission Type (Psych Patients Only)  Admission Status Involuntary  Psychosocial Assessment  Patient Complaints Anxiety  Eye Contact Fair  Facial Expression Animated  Affect Anxious;Euphoric  Speech Tangential  Interaction Assertive  Motor Activity Restless  Appearance/Hygiene Unremarkable  Behavior Characteristics Cooperative  Mood Pleasant;Anxious  Thought Process  Coherency Disorganized;Tangential  Content Preoccupation  Delusions None reported or observed  Perception WDL  Hallucination None reported or observed  Judgment Impaired  Confusion Mild  Danger to Self  Current suicidal ideation? Denies  Danger to Others  Danger to Others None reported or observed

## 2023-09-09 NOTE — Progress Notes (Signed)
   09/09/23 0555  15 Minute Checks  Location Dayroom  Visual Appearance Calm  Behavior Composed  Sleep (Behavioral Health Patients Only)  Calculate sleep? (Click Yes once per 24 hr at 0600 safety check) Yes  Documented sleep last 24 hours 7

## 2023-09-09 NOTE — Progress Notes (Addendum)
[facility] MD Progress Note  09/09/2023 7:30 AM Michelle Levy  MRN:  [id number]  Principal Problem: Bipolar affective disorder, current episode manic (HCC) Diagnosis: Principal Problem:   Bipolar affective disorder, current episode manic (HCC)  Reason for Admission:  Michelle Levy is a 67 year old female with a past psychiatric history of bipolar 1 disorder, manic, requiring hospitalization; alcohol use disorder, without withdrawal symptoms; and PTSD who was admitted to [facility] for an acute manic episode, with questionable psychotic features.   Yesterday, the psychiatry team made following recommendations: Continue Zyprexa 5 mg at bedtime   Pertinent information discussed during bed progression: Slept 7.0 hours.  Some confusion last night.  PRNs required overnight: None  Information Obtained Today During Patient Interview:   On interview, patient is "pretty joyful this morning."  Feels "connected."  Notes that she has been more observant, and that it is important for her to stay in the hospital.  She also notes that her "perception is good" and know when people are acting against her best interests.  Patient reported not sleeping straight through the night.  Was effusive about the quality of the grits.  Believes she recognized the patient from her last admission to [facility] in 2017 but has not spoken with him yet.  Tangential and incoherent at times.  Denies audible and visual hallucinations.  Denied suicidal ideation and homicidal ideation.  Amenable to Zyprexa 5 mg today.  Plans to call daughter today, number given.  Is comfortable with current medication regimen.  Past Psychiatric History:   Current psychiatrist: none. Current therapist: Previously saw pastor at her church, counselor with ex-husband Previous psychiatric diagnoses: Bipolar disorder, mixed, with psychotic features Current psychiatric medications: denied. Psychiatric medication history/compliance:  Started on Lamictal/Seroquel in 2017,  stopped approximately 1 year afterwards, did not resume. Psychiatric hospitalization(s):  2017, after a manic episode with psychosis similar to current presentation p. Psychotherapy history: none. Neuromodulation history: none. History of suicide (obtained from HPI): none. History of homicide or aggression (obtained in HPI): none.  Family Psychiatric History:   Mother: Nervous breakdown requiring hospitalization Sister: Bipolar disorder Sister: Depression Father: Alcoholism   Suicide history: none.  Violence/aggression: none. Substance use history:  EtOH-dad.  Social History:   Living situation:  Lives with self. 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 Medical diagnoses: None Medications: Patient takes no prescribed medications -- Allergies: none. Hospitalizations:  None. Surgeries:  C-section, D&C. Trauma: denied. Seizures: denied.  Family History:  Family History  Problem Relation Age of Onset   Alcohol abuse Mother    Bipolar disorder Mother    Alcohol abuse Father    Depression Sister    Bipolar disorder Sister    Breast cancer Neg Hx     Current Medications: Current Facility-Administered Medications  Medication Dose Route Frequency Provider Last Rate Last Admin   acetaminophen (TYLENOL) tablet 650 mg  650 mg Oral Q6H PRN Rankin, Shuvon B, NP       alum & mag hydroxide-simeth (MAALOX/MYLANTA) 200-200-20 MG/5ML suspension 30 mL  30 mL Oral Q4H PRN Rankin, Shuvon B, NP       hydrOXYzine (ATARAX) tablet 25 mg  25 mg Oral TID PRN Rankin, Shuvon B, NP       magnesium hydroxide (MILK OF MAGNESIA) suspension 30 mL  30 mL  Oral Daily PRN Rankin, Shuvon B, NP       multivitamin with minerals tablet 1 tablet  1 tablet Oral Daily Rankin, Shuvon B,  NP   1 tablet at 09/08/23 0744   OLANZapine (ZYPREXA) tablet 5 mg  5 mg Oral QHS Rankin, Shuvon B, NP   5 mg at 09/08/23 2109   OLANZapine zydis (ZYPREXA) disintegrating tablet 5 mg  5 mg Oral TID PRN Rankin, Shuvon B, NP       traZODone (DESYREL) tablet 50 mg  50 mg Oral QHS PRN Rankin, Shuvon B, NP       vitamin D3 (CHOLECALCIFEROL) tablet 1,000 Units  1,000 Units Oral Daily Rankin, Shuvon B, NP   1,000 Units at 09/08/23 0756    Lab Results: No results found for this or any previous visit (from the past 48 hour(s)).  Blood Alcohol level:  Lab Results  Component Value Date   ETH <10 09/06/2023   ETH 159 (H) 11/20/2015    Metabolic Labs: Lab Results  Component Value Date   HGBA1C 5.3 09/06/2023   MPG 105.41 09/06/2023   MPG 108 11/22/2015   Lab Results  Component Value Date   PROLACTIN 14.9 11/22/2015   Lab Results  Component Value Date   CHOL 198 09/06/2023   TRIG 29 09/06/2023   HDL 107 09/06/2023   CHOLHDL 1.9 09/06/2023   VLDL 6 09/06/2023   LDLCALC 85 09/06/2023   LDLCALC 101 (H) 11/22/2015    Physical Findings: AIMS: No  Psychiatric Specialty Exam:  Presentation  General Appearance: Appropriate for Environment; Casual  Eye Contact:Fair  Speech:Pressured; Clear and Coherent  Speech Volume:Increased  Handedness:Right   Mood and Affect  Mood:Euphoric; Labile; Anxious  Affect:Labile; Congruent   Thought Process  Thought Processes:Disorganized  Descriptions of Associations:Tangential  Orientation:Full (Time, Place and Person)  Thought Content:Perseveration; Tangential; Logical; Delusions  History of Schizophrenia/Schizoaffective disorder:No  Duration of Psychotic Symptoms:Less than six months  Hallucinations:Hallucinations: None  Ideas of Reference:Delusions; Paranoia  Suicidal Thoughts:Suicidal Thoughts: No  Homicidal Thoughts:Homicidal Thoughts: No   Sensorium  Memory:Immediate Fair; Recent Fair; Remote  Fair  Judgment:Poor  Insight:Poor   Executive Functions  Concentration:Poor  Attention Span:Fair  Recall:Fair  Fund of Knowledge:Fair  Language:Fair   Psychomotor Activity  Psychomotor Activity: Increased   Assets  Social Support; Leisure Time; Vocational/Educational; Desire for Improvement; Financial Resources/Insurance   Sleep  Sleep: Good  Physical Exam: Physical Exam Constitutional:      General: She is not in acute distress.    Appearance: Normal appearance. She is normal weight. She is not ill-appearing.  HENT:     Head: Atraumatic.  Eyes:     Extraocular Movements: Extraocular movements intact.  Pulmonary:     Effort: Pulmonary effort is normal. No respiratory distress.  Musculoskeletal:        General: No deformity. Normal range of motion.  Neurological:     General: No focal deficit present.     Mental Status: Mental status is at baseline.    Review of Systems  All other systems reviewed and are negative.  Blood pressure 136/78, pulse 88, temperature 98.3 F (36.8 C), temperature source Oral, resp. rate 18, height 5\' 3"  (1.6 m), weight 56.2 kg, SpO2 100%. Body mass index is 21.97 kg/m.  Treatment Plan Summary: Daily contact with patient to assess and evaluate symptoms and progress in treatment and Medication management   ASSESSMENT:  Michelle Levy is a 67 year old female with a past psychiatric history of bipolar 1 disorder, manic,  requiring hospitalization; alcohol use disorder, without withdrawal symptoms; and PTSD who was admitted to [facility] for an acute manic episode, with questionable psychotic features.   Patient remains acutely manic.  Continues to feel joyful and make inappropriate connections between unrelated ideas/thoughts.  Patient plans to call daughter today.  Appeared content to be in the hospital.  Diagnoses / Active Problems: Bipolar 1 disorder, in manic episode, with psychotic features Alcohol use disorder Posttraumatic stress  disorder  PLAN: Safety and Monitoring:  --  INVOLUNTARY admission to inpatient psychiatric unit for safety, stabilization and treatment  -- Daily contact with patient to assess and evaluate symptoms and progress in treatment  -- Patient's case to be discussed in multi-disciplinary team meeting  -- Observation Level : q15 minute checks  -- Vital signs:  q12 hours  -- Precautions: suicide, elopement, and assault  2. Psychiatric Diagnoses and Treatment:  # Bipolar 1 disorder, and manic episode, with psychotic features # Alcohol use disorder # PTSD - Increase Zyprexa 5 mg nightly ? 7.5 mg nightly in setting of continued mania - Reports of confusion earlier this morning, nightly MMSE put in via nursing protocol - The risks/benefits/side-effects/alternatives to this medication were discussed in detail with the patient and time was given for questions. The patient consents to medication trial.  - Metabolic profile and EKG monitoring obtained while on an atypical antipsychotic  BMI: 21.97 TSH: 1.128 Lipid panel: LDL 85 HbgA1c: 5.3 QTc: 415 - Encouraged patient to participate in unit milieu and in scheduled group therapies  - Short Term Goals: Ability to identify changes in lifestyle to reduce recurrence of condition will improve, Ability to verbalize feelings will improve, Ability to disclose and discuss suicidal ideas, Ability to maintain clinical measurements within normal limits will improve, and Compliance with prescribed medications will improve - Long Term Goals: Improvement in symptoms so as ready for discharge   Other PRNS: Agitation, constipation, sleep   Other labs reviewed on admission: As above   3. Medical Issues Being Addressed: None  4. Discharge Planning:   -- Social work and case management to assist with discharge planning and identification of hospital follow-up needs prior to discharge  -- Estimated Discharge Date: 4 to 6 days  -- Discharge Concerns: Need to establish  a safety plan; Medication compliance and effectiveness  -- Discharge Goals: Return home with outpatient referrals for mental health follow-up including medication management/psychotherapy   I certify that inpatient services furnished can reasonably be expected to improve the patient's condition.    Luiz Iron, MD PGY-1, Psychiatry Residency  11/22/20247:30 AM

## 2023-09-09 NOTE — [facility] Group Notes (Signed)
[facility] Group Notes:  (Nursing/MHT/Case Management/Adjunct)  Date:  09/09/2023  Time: 2000   Type of Therapy:   wrap up group  Participation Level:  Active  Participation Quality:  Appropriate  Affect:  Appropriate  Cognitive:  Appropriate  Insight:  Good  Engagement in Group:  Engaged  Modes of Intervention:  Clarification and Support  Summary of Progress/Problems:  Fay Records 09/09/2023, 10:57 PM

## 2023-09-09 NOTE — Progress Notes (Signed)
Patient presents with moderate anxiety. Patient BP continues to trend high. Scheduled Zyprexa administered.

## 2023-09-09 NOTE — BH IP Treatment Plan (Signed)
Interdisciplinary Treatment and Diagnostic Plan Update  09/09/2023 Time of Session: 10:40AM Michelle Levy MRN: [id number]  Principal Diagnosis: Bipolar affective disorder, current episode manic (HCC)  Secondary Diagnoses: Principal Problem:   Bipolar affective disorder, current episode manic (HCC)   Current Medications:  Current Facility-Administered Medications  Medication Dose Route Frequency Provider Last Rate Last Admin   acetaminophen (TYLENOL) tablet 650 mg  650 mg Oral Q6H PRN Rankin, Shuvon B, NP       alum & mag hydroxide-simeth (MAALOX/MYLANTA) 200-200-20 MG/5ML suspension 30 mL  30 mL Oral Q4H PRN Rankin, Shuvon B, NP       hydrOXYzine (ATARAX) tablet 25 mg  25 mg Oral TID PRN Rankin, Shuvon B, NP       magnesium hydroxide (MILK OF MAGNESIA) suspension 30 mL  30 mL Oral Daily PRN Rankin, Shuvon B, NP       multivitamin with minerals tablet 1 tablet  1 tablet Oral Daily Rankin, Shuvon B, NP   1 tablet at 09/09/23 0828   OLANZapine (ZYPREXA) tablet 7.5 mg  7.5 mg Oral QHS Tomie China, MD       OLANZapine zydis (ZYPREXA) disintegrating tablet 5 mg  5 mg Oral TID PRN Rankin, Shuvon B, NP       traZODone (DESYREL) tablet 50 mg  50 mg Oral QHS PRN Rankin, Shuvon B, NP       vitamin D3 (CHOLECALCIFEROL) tablet 1,000 Units  1,000 Units Oral Daily Rankin, Shuvon B, NP   1,000 Units at 09/09/23 4098   PTA Medications: Medications Prior to Admission  Medication Sig Dispense Refill Last Dose   B Complex-C (B-COMPLEX WITH VITAMIN C) tablet Take 1 tablet by mouth daily.      calcium carbonate (OS-CAL - DOSED IN MG OF ELEMENTAL CALCIUM) 1250 (500 Ca) MG tablet Take 1 tablet by mouth daily with breakfast.      Collagen-Vitamin C-Biotin (COLLAGEN PO) Take 1 Units by mouth daily.      magnesium oxide (MAG-OX) 400 (240 Mg) MG tablet Take 400 mg by mouth daily.      omega-3 acid ethyl esters (LOVAZA) 1 g capsule Take 1 g by mouth daily.      cholecalciferol (VITAMIN D) 1000 units  tablet Take 1,000 Units by mouth daily.      Multiple Vitamin (MULTIVITAMIN WITH MINERALS) TABS tablet Take 1 tablet by mouth daily. 30 tablet 0     Patient Stressors: Other: manic symptoms    Patient Strengths: Active sense of humor  Capable of independent living  Communication skills  Financial means  General fund of knowledge  Supportive family/friends   Treatment Modalities: Medication Management, Group therapy, Case management,  1 to 1 session with clinician, Psychoeducation, Recreational therapy.   Physician Treatment Plan for Primary Diagnosis: Bipolar affective disorder, current episode manic (HCC) Long Term Goal(s):     Short Term Goals: Ability to identify changes in lifestyle to reduce recurrence of condition will improve Ability to verbalize feelings will improve Ability to disclose and discuss suicidal ideas Ability to maintain clinical measurements within normal limits will improve Compliance with prescribed medications will improve  Medication Management: Evaluate patient's response, side effects, and tolerance of medication regimen.  Therapeutic Interventions: 1 to 1 sessions, Unit Group sessions and Medication administration.  Evaluation of Outcomes: Not Progressing  Physician Treatment Plan for Secondary Diagnosis: Principal Problem:   Bipolar affective disorder, current episode manic (HCC)  Long Term Goal(s):     Short Term Goals: Ability to identify  changes in lifestyle to reduce recurrence of condition will improve Ability to verbalize feelings will improve Ability to disclose and discuss suicidal ideas Ability to maintain clinical measurements within normal limits will improve Compliance with prescribed medications will improve     Medication Management: Evaluate patient's response, side effects, and tolerance of medication regimen.  Therapeutic Interventions: 1 to 1 sessions, Unit Group sessions and Medication administration.  Evaluation of  Outcomes: Not Progressing   RN Treatment Plan for Primary Diagnosis: Bipolar affective disorder, current episode manic (HCC) Long Term Goal(s): Knowledge of disease and therapeutic regimen to maintain health will improve  Short Term Goals: Ability to remain free from injury will improve, Ability to verbalize frustration and anger appropriately will improve, Ability to demonstrate self-control, Ability to participate in decision making will improve, Ability to verbalize feelings will improve, Ability to disclose and discuss suicidal ideas, Ability to identify and develop effective coping behaviors will improve, and Compliance with prescribed medications will improve  Medication Management: RN will administer medications as ordered by provider, will assess and evaluate patient's response and provide education to patient for prescribed medication. RN will report any adverse and/or side effects to prescribing provider.  Therapeutic Interventions: 1 on 1 counseling sessions, Psychoeducation, Medication administration, Evaluate responses to treatment, Monitor vital signs and CBGs as ordered, Perform/monitor CIWA, COWS, AIMS and Fall Risk screenings as ordered, Perform wound care treatments as ordered.  Evaluation of Outcomes: Not Progressing   LCSW Treatment Plan for Primary Diagnosis: Bipolar affective disorder, current episode manic (HCC) Long Term Goal(s): Safe transition to appropriate next level of care at discharge, Engage patient in therapeutic group addressing interpersonal concerns.  Short Term Goals: Engage patient in aftercare planning with referrals and resources, Increase social support, Increase ability to appropriately verbalize feelings, Increase emotional regulation, Facilitate acceptance of mental health diagnosis and concerns, Facilitate patient progression through stages of change regarding substance use diagnoses and concerns, Identify triggers associated with mental health/substance  abuse issues, and Increase skills for wellness and recovery  Therapeutic Interventions: Assess for all discharge needs, 1 to 1 time with Social worker, Explore available resources and support systems, Assess for adequacy in community support network, Educate family and significant other(s) on suicide prevention, Complete Psychosocial Assessment, Interpersonal group therapy.  Evaluation of Outcomes: Not Progressing   Progress in Treatment: Attending groups: Yes. Participating in groups: Yes. Taking medication as prescribed: Yes. Toleration medication: Yes. Family/Significant other contact made: No, will contact:  Courtney Rawley/friend [phone] Patient understands diagnosis: Yes. Discussing patient identified problems/goals with staff: Yes. Medical problems stabilized or resolved: Yes. Denies suicidal/homicidal ideation: Yes. Issues/concerns per patient self-inventory: No.  New problem(s) identified: No, Describe:  None reported  New Short Term/Long Term Goal(s): medication stabilization, elimination of SI thoughts, development of comprehensive mental wellness plan.    Patient Goals:  "Continue working on my wellness, make friends, introduce people to church"  Discharge Plan or Barriers: Patient recently admitted. CSW will continue to follow and assess for appropriate referrals and possible discharge planning.    Reason for Continuation of Hospitalization: Anxiety Medication stabilization  Estimated Length of Stay: 5-7 days  Last 3 [place] Suicide Severity Risk Score: Flowsheet Row [facility]Current[facility]06/2023 in [facility]  C-SSRS RISK CATEGORY No Risk No Risk       Last PHQ 2/9 Scores:     No data to display          Scribe  for Treatment Team: Kathi Der, LCSWA 09/09/2023 1:32 PM

## 2023-09-09 NOTE — [facility] Counselor (Signed)
Adult Comprehensive Assessment  Patient ID: Michelle Levy, female   DOB: 06-12-1956, 67 y.o.   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 Family Relationships: has a good relationship with ex husband, friend and Social research officer, government / Lack of resources (include bankruptcy): denies Housing / Lack of housing: has housing Physical health (include injuries & life threatening diseases): denies Social relationships: engages in social interactions regularly Substance abuse: denies Bereavement / Loss: denies  Living/Environment/Situation:  Living Arrangements: Alone Who else lives in the home?: no one How long has patient lived in current situation?: 12 years What is atmosphere in current home: Comfortable  Family History:  Marital status: Divorced Divorced, when?: 12 years ago What types of issues is patient dealing with in the relationship?: "he was a Office manager and an alcoholic" Additional relationship information: "we are best friends now" Are you sexually active?: No What is your sexual orientation?: heterosexual Does patient have children?: Yes How many children?: 2 How is patient's relationship with their children?: good  Childhood History:  By whom was/is the patient raised?: Both parents Description of patient's relationship with caregiver when they were a child: We came from [place].  Parents worked a lot How were you disciplined when you got in trouble as a child/adolescent?: only got 1 paddling in her life for letting a neighbor in and another time in 7th grade at school.  Does patient have siblings?: Yes Number of Siblings:  2 Description of patient's current relationship with siblings: youngest sister recently passed and relationship with older sister is getting better.  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 Are You Satisfied With Your Job?: Yes Do You Work More Than One Job?: No Work Stressors: owning a Engineer, 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 Been Impacted by Current Illness: No Has Patient ever Been in the [employer]?: No  Financial Resources:   Surveyor, quantity resources: Income from employment Does patient have a representative payee or guardian?: No  Alcohol/Substance Abuse:   If attempted suicide, did drugs/alcohol play a role in this?: No  Social Support System:   [employer] Support System: Fair Type of faith/religion: Christian How does patient's faith help to cope with current illness?: prayer  Leisure/Recreation:   Do You Have Hobbies?: Yes Leisure and Hobbies: reading, dinner with friends, talking to friend on phone  Strengths/Needs:   What is the patient's perception of their strengths?: talking to people Patient states they can use these personal strengths during their treatment to contribute to their recovery: as supports Patient states these barriers may affect/interfere with their treatment: "I work to much"  Discharge Plan:   Currently receiving community mental health services: No Patient states concerns and preferences for aftercare planning are: "to have a therapist" Patient states they will know when they are  safe and ready for discharge when: "i  am not sure" Does patient have access to transportation?: Yes Does patient have financial barriers related to discharge medications?: No Patient description of barriers related to discharge medications: has insurance Will patient be returning to same living situation after discharge?: Yes  Summary/Recommendations:   Summary and Recommendations (to be completed by the evaluator): Michelle Levy is a 67 year old woman that was admitted into [facility] on 09/07/2023 for mania (wrote 50 hyper religious Facebook post over the course of 2 days), lack of sleep, disorganized thinking, disorganized speech, inability to care for self.  She is not currently connected to MH in the community. She has a prior hospitalization.  She has support, her daughter, ex-husband, & friend.  She is open to therapy and med management once discharge. She denies alcohol and drug use but her medical reports report a history of alcohol misuse. While here, Michelle Levy can benefit from crisis stabilization, medication management, therapeutic milieu, and referrals for services.   [name]. 09/09/2023

## 2023-09-09 NOTE — Group Note (Signed)
Date:  09/09/2023 Time:  11:51 AM  Group Topic/Focus:  Orientation:   The focus of this group is to educate the patient on the purpose and policies of crisis stabilization and provide a format to answer questions about their admission.  The group details unit policies and expectations of patients while admitted.    Participation Level:  Active  Participation Quality:  Appropriate  Affect:  Appropriate  Cognitive:  Appropriate  Insight: Appropriate  Engagement in Group:  Engaged  Modes of Intervention:  Discussion  Additional Comments:     Reymundo Poll 09/09/2023, 11:51 AM

## 2023-09-10 MED ORDER — WHITE PETROLATUM EX OINT
TOPICAL_OINTMENT | CUTANEOUS | Status: AC
  Filled 2023-09-10: qty 5

## 2023-09-10 NOTE — Progress Notes (Signed)
   09/10/23 1200  Psych Admission Type (Psych Patients Only)  Admission Status Involuntary  Psychosocial Assessment  Patient Complaints Anxiety  Eye Contact Fair  Facial Expression Animated  Affect Anxious;Euphoric  Speech Tangential  Interaction Assertive  Motor Activity Restless  Appearance/Hygiene Unremarkable  Behavior Characteristics Cooperative  Mood Anxious;Pleasant  Thought Process  Coherency Tangential  Content Preoccupation  Delusions None reported or observed  Perception WDL  Hallucination None reported or observed  Judgment Impaired  Confusion None  Danger to Self  Current suicidal ideation? Denies  Danger to Others  Danger to Others None reported or observed   Pt social with peers on unit, remains with tangential speech and lacks boundaries. Pt denies SI/HI/AVH.

## 2023-09-10 NOTE — Progress Notes (Signed)
   09/09/23 2100  Psych Admission Type (Psych Patients Only)  Admission Status Involuntary  Psychosocial Assessment  Patient Complaints Anxiety  Eye Contact Fair  Facial Expression Animated  Affect Anxious;Euphoric  Speech Tangential  Interaction Assertive  Motor Activity Restless  Appearance/Hygiene Unremarkable  Behavior Characteristics Cooperative  Mood Anxious;Pleasant  Thought Process  Coherency Tangential  Content Preoccupation  Delusions None reported or observed  Perception WDL  Hallucination None reported or observed  Judgment Impaired  Confusion Mild  Danger to Self  Current suicidal ideation? Denies  Agreement Not to Harm Self Yes  Description of Agreement Verbal  Danger to Others  Danger to Others None reported or observed

## 2023-09-10 NOTE — Group Note (Signed)
[facility] LCSW Group Therapy Note  Date/Time:    09/10/2023 10:00am-11:00am  Type of Therapy and Topic:  Group Therapy:  How To Lillard Anes and Forgive Self  Participation Level:  Active   Description of Group:  At the request of several group members, the focus of this group was to examine our tendency to be hyper-critical of self and how we need to show ourselves grace and even forgive ourselves at times.  CSW pointed out that refusing to show ourselves grace or forgive ourselves often leads to feelings of worthlessness, hopelessness, and shame.  Patients were guided to the concept that shame is universal and is worsened by being kept "in the dark," but improved by being "brought into the light."  A variety of shaming experiences were brought up and the group was able to identify the commonality of these experiences.  Research about shame being connected to 12 different areas of "not enough" for both men and women was mentioned and patients were encouraged to start change their internal dialog to "I am enough."  A song entitled the same was played and greatly appreciated by group members.  Therapeutic Goals Identify ways in which we tend to extend grace and forgiveness to others, but refuse to do so to ourselves.   Examine different areas of life in which humans in general tend to feel they are "not enough." Talk about the frequency with which we hide our shame and how that enlarges it. Allow patients to discuss their shame out loud in order to reduce its power. Practice the affirmation "I Am Enough" through song.  Summary of Patient Progress: During the ice breaker, patient expressed that something other group members did not know about them was that she feels invisible often.  This was used ultimately to show the commonalities that we have without realizing it, with an emphasis that this also applies to our emotions being more common and pervasive than we know.  The patient listened well during group  and contributed multiple times to the topic, often referencing family members.  Therapeutic Modalities Processing Psychoeducation  Ambrose Mantle, LCSW 09/10/2023, 2:44 PM

## 2023-09-10 NOTE — Plan of Care (Signed)

## 2023-09-10 NOTE — Progress Notes (Signed)
[facility] MD Progress Note  09/10/2023 7:49 AM Michelle Levy  MRN:  [id number]  Principal Problem: Bipolar affective disorder, current episode manic (HCC) Diagnosis: Principal Problem:   Bipolar affective disorder, current episode manic (HCC)  Reason for Admission:  Michelle Levy is a 67 year old female with a past psychiatric history of bipolar 1 disorder, manic, requiring hospitalization; alcohol use disorder, without withdrawal symptoms; and PTSD who was admitted to [facility] for an acute manic episode, with questionable psychotic features.   Yesterday, the psychiatry team made following recommendations: Increase Zyprexa to 7.5 nightly  Pertinent information discussed during bed progression: Slept 8 hours, manic, foggy headed. PRNs required overnight: None  Information Obtained Today During Patient Interview:   On interview, patient has "cleared up a bit", then proceeded to make a series of jokes that were unintelligible.  Notes being somewhat groggy this morning.  She is eating "what she wants."  Endorsed visual hallucinations: "I can see colors, and then there is nothing there."  Mood is "better than okay."  Says that she has "researched everything."  Patient exhibits some difficulty remembering examiner's names, despite having written both of them down yesterday.  Per patient, she takes notes on her phone when she has to remember things.  Amenable to [facility], later today.  No medication side effects other than this grogginess.  Believes she has met Dr. Enedina Finner, present during interview, before during 2017 visit.  Still grossly manic.  Denied SI, HI.  No auditory hallucinations.  Past Psychiatric History:   Current psychiatrist: none. Current therapist: Previously saw pastor at her church, counselor with ex-husband Previous psychiatric diagnoses: Bipolar disorder, mixed, with psychotic features Current psychiatric medications: denied. Psychiatric medication history/compliance:  Started on  Lamictal/Seroquel in 2017, stopped approximately 1 year afterwards, did not resume. Psychiatric hospitalization(s):  2017, after a manic episode with psychosis similar to current presentation p. Psychotherapy history: none. Neuromodulation history: none. History of suicide (obtained from HPI): none. History of homicide or aggression (obtained in HPI): none.  Family Psychiatric History:   Mother: Nervous breakdown requiring hospitalization Sister: Bipolar disorder Sister: Depression Father: Alcoholism   Suicide history: none.  Violence/aggression: none. Substance use history:  EtOH-dad.  Social History:   Living situation:  Lives with self. 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 Medical diagnoses: None Medications: Patient takes no prescribed medications -- Allergies: none. Hospitalizations:  None. Surgeries:  C-section, D&C. Trauma: denied. Seizures: denied.  Family History:  Family History  Problem Relation Age of Onset   Alcohol abuse Mother    Bipolar disorder Mother    Alcohol abuse Father    Depression Sister    Bipolar disorder Sister    Breast cancer Neg Hx     Current Medications: Current Facility-Administered Medications  Medication Dose Route Frequency Provider Last Rate Last Admin   acetaminophen (TYLENOL) tablet 650 mg  650 mg Oral Q6H PRN Rankin, Shuvon B, NP       alum & mag hydroxide-simeth (MAALOX/MYLANTA) 200-200-20 MG/5ML suspension 30 mL  30 mL Oral Q4H PRN Rankin, Shuvon B, NP       hydrOXYzine (ATARAX) tablet 25 mg  25 mg Oral TID PRN Rankin, Shuvon B, NP       magnesium hydroxide (MILK OF MAGNESIA) suspension 30 mL  30 mL Oral Daily PRN  Rankin, Shuvon B, NP       multivitamin with minerals tablet 1 tablet  1 tablet  Oral Daily Rankin, Shuvon B, NP   1 tablet at 09/09/23 0828   OLANZapine (ZYPREXA) tablet 7.5 mg  7.5 mg Oral QHS Tomie China, MD   7.5 mg at 09/09/23 2109   OLANZapine zydis (ZYPREXA) disintegrating tablet 5 mg  5 mg Oral TID PRN Rankin, Shuvon B, NP       traZODone (DESYREL) tablet 50 mg  50 mg Oral QHS PRN Rankin, Shuvon B, NP       vitamin D3 (CHOLECALCIFEROL) tablet 1,000 Units  1,000 Units Oral Daily Rankin, Shuvon B, NP   1,000 Units at 09/09/23 8413    Lab Results: No results found for this or any previous visit (from the past 48 hour(s)).  Blood Alcohol level:  Lab Results  Component Value Date   ETH <10 09/06/2023   ETH 159 (H) 11/20/2015    Metabolic Labs: Lab Results  Component Value Date   HGBA1C 5.3 09/06/2023   MPG 105.41 09/06/2023   MPG 108 11/22/2015   Lab Results  Component Value Date   PROLACTIN 14.9 11/22/2015   Lab Results  Component Value Date   CHOL 198 09/06/2023   TRIG 29 09/06/2023   HDL 107 09/06/2023   CHOLHDL 1.9 09/06/2023   VLDL 6 09/06/2023   LDLCALC 85 09/06/2023   LDLCALC 101 (H) 11/22/2015    Physical Findings: AIMS: No  Psychiatric Specialty Exam:  Presentation  General Appearance: Appropriate for Environment  Eye Contact:Fair  Speech:Pressured  Speech Volume:Normal  Handedness:Right   Mood and Affect  Mood:Euphoric  Affect:Congruent   Thought Process  Thought Processes:Disorganized  Descriptions of Associations:Tangential  Orientation:Full (Time, Place and Person)  Thought Content:Tangential; Perseveration; Illogical  History of Schizophrenia/Schizoaffective disorder:No  Duration of Psychotic Symptoms:Less than six months  Hallucinations:Hallucinations: None  Ideas of Reference:Delusions  Suicidal Thoughts:Suicidal Thoughts: No  Homicidal Thoughts:Homicidal Thoughts: No   Sensorium  Memory:Immediate Fair; Recent Fair; Remote Fair  Judgment:Poor  Insight:Poor   Executive Functions   Concentration:Poor  Attention Span:Poor  Recall:Fair  Fund of Knowledge:Fair  Language:Fair   Psychomotor Activity  Psychomotor Activity: Increased   Assets  Social Support; Leisure Time; Vocational/Educational   Sleep  Sleep: Good Number of Hours of Sleep: 7  Physical Exam: Physical Exam Constitutional:      General: She is not in acute distress.    Appearance: Normal appearance. She is normal weight. She is not ill-appearing.  HENT:     Head: Atraumatic.  Eyes:     Extraocular Movements: Extraocular movements intact.  Pulmonary:     Effort: Pulmonary effort is normal. No respiratory distress.  Musculoskeletal:        General: No deformity. Normal range of motion.  Neurological:     General: No focal deficit present.     Mental Status: Mental status is at baseline.    Review of Systems  All other systems reviewed and are negative.  Blood pressure 132/75, pulse (!) 102, temperature 98.2 F (36.8 C), temperature source Oral, resp. rate 18, height 5\' 3"  (1.6 m), weight 56.2 kg, SpO2 100%. Body mass index is 21.97 kg/m.  Treatment Plan Summary: Daily contact with patient to assess and evaluate symptoms and progress in treatment and Medication management   ASSESSMENT:  Michelle Levy is a 67 year old female with a past psychiatric history of bipolar 1 disorder, manic, requiring hospitalization; alcohol use disorder, without withdrawal symptoms; and PTSD who  was admitted to [facility] for an acute manic episode, with questionable psychotic features.   Diagnoses / Active Problems: Bipolar 1 disorder, in manic episode, with psychotic features Alcohol use disorder Posttraumatic stress disorder  PLAN: Safety and Monitoring:  --  INVOLUNTARY admission to inpatient psychiatric unit for safety, stabilization and treatment  -- Daily contact with patient to assess and evaluate symptoms and progress in treatment  -- Patient's case to be discussed in multi-disciplinary team  meeting  -- Observation Level : q15 minute checks  -- Vital signs:  q12 hours  -- Precautions: suicide, elopement, and assault  2. Psychiatric Diagnoses and Treatment:  # Bipolar 1 disorder, and manic episode, with psychotic features # Alcohol use disorder # PTSD - Continue Zyprexa 7.5 mg nightly, very manic, consider increase to 10 mg if still manic tomorrow 11/24 vs Monday 11/25. - Consider mood stabilizer, possibly valproic acid in the setting of acute manic episode. - Reports of confusion earlier this morning, nightly MMSE put in via nursing protocol - The risks/benefits/side-effects/alternatives to this medication were discussed in detail with the patient and time was given for questions. The patient consents to medication trial.  - Metabolic profile and EKG monitoring obtained while on an atypical antipsychotic  BMI: 21.97 TSH: 1.128 Lipid panel: LDL 85 HbgA1c: 5.3 QTc: 415 - Encouraged patient to participate in unit milieu and in scheduled group therapies  - Short Term Goals: Ability to identify changes in lifestyle to reduce recurrence of condition will improve, Ability to verbalize feelings will improve, Ability to disclose and discuss suicidal ideas, Ability to maintain clinical measurements within normal limits will improve, and Compliance with prescribed medications will improve - Long Term Goals: Improvement in symptoms so as ready for discharge   Other PRNS: Agitation, constipation, sleep   Other labs reviewed on admission: As above   3. Medical Issues Being Addressed: None  4. Discharge Planning:   -- Social work and case management to assist with discharge planning and identification of hospital follow-up needs prior to discharge  -- Estimated Discharge Date: 4 to 6 days  -- Discharge Concerns: Need to establish a safety plan; Medication compliance and effectiveness  -- Discharge Goals: Return home with outpatient referrals for mental health follow-up including  medication management/psychotherapy   I certify that inpatient services furnished can reasonably be expected to improve the patient's condition.    Luiz Iron, MD PGY-1, Psychiatry Residency  11/23/20247:49 AM

## 2023-09-10 NOTE — Group Note (Unsigned)
Date:  09/11/2023 Time:  1:05 AM  Group Topic/Focus:  Wrap-Up Group:   The focus of this group is to help patients review their daily goal of treatment and discuss progress on daily workbooks.    Participation Level:  Active  Participation Quality:  Appropriate and Sharing  Affect:  Appropriate  Cognitive:  Appropriate  Insight: Appropriate  Engagement in Group:  Engaged  Modes of Intervention:  Activity and Socialization  Additional Comments:  Patient stated that she had a "good day"/ Patient stated that she had some anxiety today because she thought she was being discharged and she is missing her dogs. However, patient stated that she did not let that effect her much and stated that she "feels better. Patient rated her day a 10/10. Patient participated in the group activity.   Michelle Levy 09/11/2023, 1:05 AM

## 2023-09-10 NOTE — [facility] Group Notes (Signed)
[facility] Group Notes:  (Nursing/MHT/Case Management/Adjunct)  Date:  09/10/2023  Time:  9:32 AM  Type of Therapy:   Goals  Participation Level:  Active  Participation Quality:  Appropriate  Affect:  Appropriate  Cognitive:  Appropriate  Insight:  Good  Engagement in Group:  Engaged  Modes of Intervention:  Discussion and Orientation  Summary of Progress/Problems:  Azalee Course 09/10/2023, 9:32 AM

## 2023-09-11 MED ORDER — OLANZAPINE 10 MG PO TABS
10.0000 mg | ORAL_TABLET | Freq: Every day | ORAL | Status: DC
  Administered 2023-09-11 – 2023-09-12 (×2): 10 mg via ORAL
  Filled 2023-09-11 (×4): qty 1

## 2023-09-11 NOTE — Plan of Care (Signed)
  Problem: Education: Goal: Knowledge of [facility] Education information/materials will improve 09/11/2023 1954 by Jearl Klinefelter, RN Outcome: Progressing 09/11/2023 1947 by Jearl Klinefelter, RN Outcome: Progressing 09/11/2023 1946 by Jearl Klinefelter, RN Outcome: Progressing 09/11/2023 1943 by Jearl Klinefelter, RN Outcome: Progressing Goal: Emotional status will improve 09/11/2023 1954 by Jearl Klinefelter, RN Outcome: Progressing 09/11/2023 1947 by Jearl Klinefelter, RN Outcome: Progressing 09/11/2023 1946 by Jearl Klinefelter, RN Outcome: Progressing 09/11/2023 1943 by Jearl Klinefelter, RN Outcome: Progressing Goal: Mental status will improve 09/11/2023 1954 by Jearl Klinefelter, RN Outcome: Progressing 09/11/2023 1947 by Jearl Klinefelter, RN Outcome: Progressing 09/11/2023 1946 by Jearl Klinefelter, RN Outcome: Progressing 09/11/2023 1943 by Jearl Klinefelter, RN Outcome: Progressing Goal: Verbalization of understanding the information provided will improve 09/11/2023 1954 by Jearl Klinefelter, RN Outcome: Progressing 09/11/2023 1947 by Jearl Klinefelter, RN Outcome: Progressing 09/11/2023 1946 by Jearl Klinefelter, RN Outcome: Progressing 09/11/2023 1943 by Jearl Klinefelter, RN Outcome: Progressing   Problem: Activity: Goal: Interest or engagement in activities will improve 09/11/2023 1954 by Jearl Klinefelter, RN Outcome: Progressing 09/11/2023 1947 by Jearl Klinefelter, RN Outcome: Progressing 09/11/2023 1946 by Jearl Klinefelter, RN Outcome: Progressing 09/11/2023 1943 by Jearl Klinefelter, RN Outcome: Progressing Goal: Sleeping patterns will improve 09/11/2023 1954 by Jearl Klinefelter, RN Outcome: Progressing 09/11/2023 1947 by Jearl Klinefelter, RN Outcome: Progressing 09/11/2023 1946 by Jearl Klinefelter, RN Outcome: Progressing 09/11/2023 1943 by Jearl Klinefelter, RN Outcome: Progressing   Problem: Coping: Goal: Ability to  verbalize frustrations and anger appropriately will improve 09/11/2023 1954 by Jearl Klinefelter, RN Outcome: Progressing 09/11/2023 1947 by Jearl Klinefelter, RN Outcome: Progressing 09/11/2023 1946 by Jearl Klinefelter, RN Outcome: Progressing 09/11/2023 1943 by Jearl Klinefelter, RN Outcome: Progressing Goal: Ability to demonstrate self-control will improve 09/11/2023 1954 by Jearl Klinefelter, RN Outcome: Progressing 09/11/2023 1947 by Jearl Klinefelter, RN Outcome: Progressing 09/11/2023 1946 by Jearl Klinefelter, RN Outcome: Progressing 09/11/2023 1943 by Jearl Klinefelter, RN Outcome: Progressing   Problem: Health Behavior/Discharge Planning: Goal: Identification of resources available to assist in meeting health care needs will improve 09/11/2023 1954 by Jearl Klinefelter, RN Outcome: Progressing 09/11/2023 1947 by Jearl Klinefelter, RN Outcome: Progressing 09/11/2023 1946 by Jearl Klinefelter, RN Outcome: Progressing 09/11/2023 1943 by Jearl Klinefelter, RN Outcome: Progressing Goal: Compliance with treatment plan for underlying cause of condition will improve 09/11/2023 1954 by Jearl Klinefelter, RN Outcome: Progressing 09/11/2023 1947 by Jearl Klinefelter, RN Outcome: Progressing 09/11/2023 1946 by Jearl Klinefelter, RN Outcome: Progressing 09/11/2023 1943 by Jearl Klinefelter, RN Outcome: Progressing   Problem: Physical Regulation: Goal: Ability to maintain clinical measurements within normal limits will improve 09/11/2023 1954 by Jearl Klinefelter, RN Outcome: Progressing 09/11/2023 1947 by Jearl Klinefelter, RN Outcome: Progressing 09/11/2023 1946 by Jearl Klinefelter, RN Outcome: Progressing 09/11/2023 1943 by Jearl Klinefelter, RN Outcome: Progressing   Problem: Safety: Goal: Periods of time without injury will increase 09/11/2023 1954 by Jearl Klinefelter, RN Outcome: Progressing 09/11/2023 1947 by Jearl Klinefelter, RN Outcome:  Progressing 09/11/2023 1946 by Jearl Klinefelter, RN Outcome: Progressing 09/11/2023 1943 by Jearl Klinefelter, RN Outcome: Progressing

## 2023-09-11 NOTE — Plan of Care (Signed)

## 2023-09-11 NOTE — Progress Notes (Signed)
Patient is ready to discharge today.  Denied SI and HI, contracts for safety.  Denied A/V hallucinations.  Denied pain.  Has a lot of stressors at home that she needs to attend to, dog, etc.

## 2023-09-11 NOTE — Progress Notes (Signed)
[facility] MD Progress Note  09/11/2023 10:48 AM [name]  MRN:  [id number]  Principal Problem: Bipolar affective disorder, current episode manic (HCC) Diagnosis: Principal Problem:   Bipolar affective disorder, current episode manic (HCC)  Reason for Admission:  Michelle Levy is a 67 year old female with a past psychiatric history of bipolar 1 disorder, manic, requiring hospitalization; alcohol use disorder, without withdrawal symptoms; and PTSD who was admitted to [facility] for an acute manic episode, with questionable psychotic features.   The patient's chart was reviewed and nursing notes were reviewed. Vitals signs: BP 146/79. The patient's case was discussed in multidisciplinary team meeting. Per [facility], patient was taking medications appropriately. The following as needed medications were given: vaseline. Per nursing, patient is calm and cooperative and attended group sessions.    Information Obtained Today During Patient Interview:  The patient was seen in her room, no acute distress. On assessment, the patient feels "joy" today and states "the Shaune Pollack has been with me". Patient feels the group sessions have been good. When asked about speaking with family, she reports her close friend visited her yesterday and it went well.   Patient reports having good sleep.  Patient reports good appetite. Patient feels that the medications have been helpful and denies adverse effects.   Patient denies current SI, HI, AVH.   Past Psychiatric History:   Current psychiatrist: none. Current therapist: Previously saw pastor at her church, counselor with ex-husband Previous psychiatric diagnoses: Bipolar disorder, mixed, with psychotic features Current psychiatric medications: denied. Psychiatric medication history/compliance:  Started on Lamictal/Seroquel in 2017, stopped approximately 1 year afterwards, did not resume. Psychiatric hospitalization(s):  2017, after a manic episode with psychosis similar to  current presentation p. Psychotherapy history: none. Neuromodulation history: none. History of suicide (obtained from HPI): none. History of homicide or aggression (obtained in HPI): none.  Family Psychiatric History:   Mother: Nervous breakdown requiring hospitalization Sister: Bipolar disorder Sister: Depression Father: Alcoholism   Suicide history: none.  Violence/aggression: none. Substance use history:  EtOH-dad.  Social History:   Living situation:  Lives with self. 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 Medical diagnoses: None Medications: Patient takes no prescribed medications -- Allergies: none. Hospitalizations:  None. Surgeries:  C-section, D&C. Trauma: denied. Seizures: denied.  Family History:  Family History  Problem Relation Age of Onset   Alcohol abuse Mother    Bipolar disorder Mother    Alcohol abuse Father    Depression Sister    Bipolar disorder Sister    Breast cancer Neg Hx     Current Medications: Current Facility-Administered Medications  Medication Dose Route Frequency Provider Last Rate Last Admin   acetaminophen (TYLENOL) tablet 650 mg  650 mg Oral Q6H PRN Rankin, Shuvon B, NP       alum & mag hydroxide-simeth (MAALOX/MYLANTA) 200-200-20 MG/5ML suspension 30 mL  30 mL Oral Q4H PRN Rankin, Shuvon B, NP       hydrOXYzine (ATARAX) tablet 25 mg  25 mg Oral TID PRN Rankin, Shuvon B, NP       magnesium hydroxide (MILK OF MAGNESIA) suspension 30 mL  30 mL Oral Daily PRN Rankin, Shuvon B, NP       multivitamin with minerals tablet 1 tablet  1 tablet Oral Daily Rankin, Shuvon B, NP  1 tablet at 09/11/23 0747   OLANZapine (ZYPREXA) tablet 10 mg  10 mg Oral QHS Kizzie Ide B, MD       OLANZapine zydis (ZYPREXA)  disintegrating tablet 5 mg  5 mg Oral TID PRN Rankin, Shuvon B, NP       traZODone (DESYREL) tablet 50 mg  50 mg Oral QHS PRN Rankin, Shuvon B, NP       vitamin D3 (CHOLECALCIFEROL) tablet 1,000 Units  1,000 Units Oral Daily Rankin, Shuvon B, NP   1,000 Units at 09/11/23 0746    Lab Results: No results found for this or any previous visit (from the past 48 hour(s)).  Blood Alcohol level:  Lab Results  Component Value Date   ETH <10 09/06/2023   ETH 159 (H) 11/20/2015    Metabolic Labs: Lab Results  Component Value Date   HGBA1C 5.3 09/06/2023   MPG 105.41 09/06/2023   MPG 108 11/22/2015   Lab Results  Component Value Date   PROLACTIN 14.9 11/22/2015   Lab Results  Component Value Date   CHOL 198 09/06/2023   TRIG 29 09/06/2023   HDL 107 09/06/2023   CHOLHDL 1.9 09/06/2023   VLDL 6 09/06/2023   LDLCALC 85 09/06/2023   LDLCALC 101 (H) 11/22/2015    Physical Findings: AIMS: No  Psychiatric Specialty Exam:  Presentation  General Appearance: Appropriate for Environment; Casual; Fairly Groomed  Eye Contact:Fair  Speech:Clear and Coherent  Speech Volume:Normal  Handedness:Right   Mood and Affect  Mood:Euphoric  Affect:Congruent   Thought Process  Thought Processes:Coherent  Descriptions of Associations:Circumstantial  Orientation:Full (Time, Place and Person)  Thought Content:Logical  History of Schizophrenia/Schizoaffective disorder:No   Hallucinations:Hallucinations: None   Ideas of Reference:None  Suicidal Thoughts:Suicidal Thoughts: No  Homicidal Thoughts:Homicidal Thoughts: No   Sensorium  Memory:Remote Fair  Judgment:Impaired  Insight:Fair   Executive Functions  Concentration:Fair  Attention Span:Fair  Recall:Fair  Fund of Knowledge:Fair  Language:Fair   Psychomotor Activity  Psychomotor Activity: Normal   Assets  Resilience; Desire for Improvement   Sleep  Sleep: Good Number of Hours of Sleep: 8  Physical  Exam: Physical Exam Constitutional:      General: She is not in acute distress.    Appearance: Normal appearance. She is normal weight. She is not ill-appearing.  HENT:     Head: Atraumatic.  Eyes:     Extraocular Movements: Extraocular movements intact.  Pulmonary:     Effort: Pulmonary effort is normal. No respiratory distress.  Musculoskeletal:        General: No deformity. Normal range of motion.  Neurological:     General: No focal deficit present.     Mental Status: Mental status is at baseline.    Review of Systems  All other systems reviewed and are negative.  Blood pressure (!) 146/59, pulse 68, temperature 98 F (36.7 C), temperature source Oral, resp. rate 16, height 5\' 3"  (1.6 m), weight 56.2 kg, SpO2 100%. Body mass index is 21.97 kg/m.  Treatment Plan Summary: Daily contact with patient to assess and evaluate symptoms and progress in treatment and Medication management   ASSESSMENT:  Michelle Levy is a 67 year old female with a past psychiatric history of bipolar 1 disorder, manic, requiring hospitalization; alcohol use disorder, without withdrawal symptoms; and PTSD who was admitted to [facility] for an acute manic episode, with questionable psychotic features.   Diagnoses / Active Problems: Bipolar 1 disorder, in manic episode, with psychotic features Alcohol use disorder Posttraumatic stress disorder  PLAN: Safety  and Monitoring:  --  INVOLUNTARY admission to inpatient psychiatric unit for safety, stabilization and treatment  -- Daily contact with patient to assess and evaluate symptoms and progress in treatment  -- Patient's case to be discussed in multi-disciplinary team meeting  -- Observation Level : q15 minute checks  -- Vital signs:  q12 hours  -- Precautions: suicide, elopement, and assault  2. Psychiatric Diagnoses and Treatment:  # Bipolar 1 disorder, and manic episode, with psychotic features # Alcohol use disorder # PTSD - Increase Zyprexa to 10  mg nightly for mania - Consider mood stabilizer, possibly valproic acid in the setting of acute manic episode. - Reports of confusion earlier this morning, nightly MMSE put in via nursing protocol - The risks/benefits/side-effects/alternatives to this medication were discussed in detail with the patient and time was given for questions. The patient consents to medication trial.  - Metabolic profile and EKG monitoring obtained while on an atypical antipsychotic  BMI: 21.97 TSH: 1.128 Lipid panel: LDL 85 HbgA1c: 5.3 QTc: 415 - Encouraged patient to participate in unit milieu and in scheduled group therapies  - Short Term Goals: Ability to identify changes in lifestyle to reduce recurrence of condition will improve, Ability to verbalize feelings will improve, Ability to disclose and discuss suicidal ideas, Ability to maintain clinical measurements within normal limits will improve, and Compliance with prescribed medications will improve - Long Term Goals: Improvement in symptoms so as ready for discharge   Other PRNS: Agitation, constipation, sleep   Other labs reviewed on admission: As above   3. Medical Issues Being Addressed: None  4. Discharge Planning:   -- Social work and case management to assist with discharge planning and identification of hospital follow-up needs prior to discharge  -- Estimated Discharge Date: 4 to 6 days  -- Discharge Concerns: Need to establish a safety plan; Medication compliance and effectiveness  -- Discharge Goals: Return home with outpatient referrals for mental health follow-up including medication management/psychotherapy   I certify that inpatient services furnished can reasonably be expected to improve the patient's condition.    Kizzie Ide, MD PGY-2 Psychiatry  11/24/202410:48 AM

## 2023-09-11 NOTE — Plan of Care (Signed)
Problem: Education: Goal: Knowledge of [facility] Education information/materials will improve 09/11/2023 1954 by Jearl Klinefelter, RN Outcome: Progressing 09/11/2023 1954 by Jearl Klinefelter, RN Outcome: Progressing 09/11/2023 1947 by Jearl Klinefelter, RN Outcome: Progressing 09/11/2023 1946 by Jearl Klinefelter, RN Outcome: Progressing 09/11/2023 1943 by Jearl Klinefelter, RN Outcome: Progressing Goal: Emotional status will improve 09/11/2023 1954 by Jearl Klinefelter, RN Outcome: Progressing 09/11/2023 1954 by Jearl Klinefelter, RN Outcome: Progressing 09/11/2023 1947 by Jearl Klinefelter, RN Outcome: Progressing 09/11/2023 1946 by Jearl Klinefelter, RN Outcome: Progressing 09/11/2023 1943 by Jearl Klinefelter, RN Outcome: Progressing Goal: Mental status will improve 09/11/2023 1954 by Jearl Klinefelter, RN Outcome: Progressing 09/11/2023 1954 by Jearl Klinefelter, RN Outcome: Progressing 09/11/2023 1947 by Jearl Klinefelter, RN Outcome: Progressing 09/11/2023 1946 by Jearl Klinefelter, RN Outcome: Progressing 09/11/2023 1943 by Jearl Klinefelter, RN Outcome: Progressing Goal: Verbalization of understanding the information provided will improve 09/11/2023 1954 by Jearl Klinefelter, RN Outcome: Progressing 09/11/2023 1954 by Jearl Klinefelter, RN Outcome: Progressing 09/11/2023 1947 by Jearl Klinefelter, RN Outcome: Progressing 09/11/2023 1946 by Jearl Klinefelter, RN Outcome: Progressing 09/11/2023 1943 by Jearl Klinefelter, RN Outcome: Progressing   Problem: Activity: Goal: Interest or engagement in activities will improve 09/11/2023 1954 by Jearl Klinefelter, RN Outcome: Progressing 09/11/2023 1954 by Jearl Klinefelter, RN Outcome: Progressing 09/11/2023 1947 by Jearl Klinefelter, RN Outcome: Progressing 09/11/2023 1946 by Jearl Klinefelter, RN Outcome: Progressing 09/11/2023 1943 by Jearl Klinefelter, RN Outcome: Progressing Goal: Sleeping  patterns will improve 09/11/2023 1954 by Jearl Klinefelter, RN Outcome: Progressing 09/11/2023 1954 by Jearl Klinefelter, RN Outcome: Progressing 09/11/2023 1947 by Jearl Klinefelter, RN Outcome: Progressing 09/11/2023 1946 by Jearl Klinefelter, RN Outcome: Progressing 09/11/2023 1943 by Jearl Klinefelter, RN Outcome: Progressing   Problem: Coping: Goal: Ability to verbalize frustrations and anger appropriately will improve 09/11/2023 1954 by Jearl Klinefelter, RN Outcome: Progressing 09/11/2023 1954 by Jearl Klinefelter, RN Outcome: Progressing 09/11/2023 1947 by Jearl Klinefelter, RN Outcome: Progressing 09/11/2023 1946 by Jearl Klinefelter, RN Outcome: Progressing 09/11/2023 1943 by Jearl Klinefelter, RN Outcome: Progressing Goal: Ability to demonstrate self-control will improve 09/11/2023 1954 by Jearl Klinefelter, RN Outcome: Progressing 09/11/2023 1954 by Jearl Klinefelter, RN Outcome: Progressing 09/11/2023 1947 by Jearl Klinefelter, RN Outcome: Progressing 09/11/2023 1946 by Jearl Klinefelter, RN Outcome: Progressing 09/11/2023 1943 by Jearl Klinefelter, RN Outcome: Progressing   Problem: Health Behavior/Discharge Planning: Goal: Identification of resources available to assist in meeting health care needs will improve 09/11/2023 1954 by Jearl Klinefelter, RN Outcome: Progressing 09/11/2023 1954 by Jearl Klinefelter, RN Outcome: Progressing 09/11/2023 1947 by Jearl Klinefelter, RN Outcome: Progressing 09/11/2023 1946 by Jearl Klinefelter, RN Outcome: Progressing 09/11/2023 1943 by Jearl Klinefelter, RN Outcome: Progressing Goal: Compliance with treatment plan for underlying cause of condition will improve 09/11/2023 1954 by Jearl Klinefelter, RN Outcome: Progressing 09/11/2023 1954 by Jearl Klinefelter, RN Outcome: Progressing 09/11/2023 1947 by Jearl Klinefelter, RN Outcome: Progressing 09/11/2023 1946 by Jearl Klinefelter, RN Outcome:  Progressing 09/11/2023 1943 by Jearl Klinefelter, RN Outcome: Progressing   Problem: Physical Regulation: Goal: Ability to maintain clinical measurements within normal limits will improve 09/11/2023 1954 by Jearl Klinefelter, RN Outcome: Progressing 09/11/2023 1954 by Jearl Klinefelter, RN Outcome: Progressing 09/11/2023 1947 by Jearl Klinefelter, RN Outcome: Progressing 09/11/2023 1946 by Terrilee Croak  Adria Devon, RN Outcome: Progressing 09/11/2023 1943 by Jearl Klinefelter, RN Outcome: Progressing   Problem: Safety: Goal: Periods of time without injury will increase 09/11/2023 1954 by Jearl Klinefelter, RN Outcome: Progressing 09/11/2023 1954 by Jearl Klinefelter, RN Outcome: Progressing 09/11/2023 1947 by Jearl Klinefelter, RN Outcome: Progressing 09/11/2023 1946 by Jearl Klinefelter, RN Outcome: Progressing 09/11/2023 1943 by Jearl Klinefelter, RN Outcome: Progressing

## 2023-09-11 NOTE — [facility] Group Notes (Signed)
Wrap-Up Group: This group's main objectives are to assist patients in reviewing their daily treatment goals and talking about their progress on their daily workbooks. "What are you avoiding, facing?" was today's question. Have you determined that it's not time or that you're not ready to work on something that you know you need to?  Pt response:  " deciding to leave my 69 year old marriage because I knew it would be the best decision.  Alcohol was what I was avoiding the most but I understand now how it was effecting me and I notice the amount increasing between me and my husband "

## 2023-09-11 NOTE — Progress Notes (Signed)
D:  Patient denied SI and HI, contracts for safety.  Denied A/V hallucinations.  Denied pain. A:  Medications administered per MD orders.  Emotional support and encouragement given patient. R:  Safety maintained with 15 minute checks.

## 2023-09-11 NOTE — Progress Notes (Signed)
   09/10/23 2056  Psych Admission Type (Psych Patients Only)  Admission Status Involuntary  Psychosocial Assessment  Patient Complaints Anxiety  Eye Contact Fair  Facial Expression Animated  Affect Anxious;Euphoric  Speech Logical/coherent  Interaction Assertive  Motor Activity Restless  Appearance/Hygiene Unremarkable  Behavior Characteristics Cooperative;Appropriate to situation  Mood Anxious;Pleasant  Thought Process  Coherency Circumstantial  Content Preoccupation  Delusions None reported or observed  Perception WDL  Hallucination None reported or observed  Judgment Impaired  Confusion None  Danger to Self  Current suicidal ideation? Denies  Agreement Not to Harm Self Yes  Description of Agreement verbal  Danger to Others  Danger to Others None reported or observed

## 2023-09-11 NOTE — Progress Notes (Signed)
   09/11/23 2100  Psych Admission Type (Psych Patients Only)  Admission Status Involuntary  Psychosocial Assessment  Patient Complaints Anxiety  Eye Contact Fair  Facial Expression Animated  Affect Anxious  Speech Logical/coherent  Interaction Assertive  Motor Activity Restless  Appearance/Hygiene Unremarkable  Behavior Characteristics Cooperative  Mood Anxious  Thought Process  Coherency Circumstantial  Content Preoccupation  Delusions None reported or observed  Perception WDL  Hallucination None reported or observed  Judgment Impaired  Confusion None  Danger to Self  Current suicidal ideation? Denies  Agreement Not to Harm Self Yes  Description of Agreement Verbal  Danger to Others  Danger to Others None reported or observed

## 2023-09-11 NOTE — Plan of Care (Signed)
Problem: Education: Goal: Emotional status will improve Outcome: Progressing Goal: Mental status will improve Outcome: Progressing Goal: Verbalization of understanding the information provided will improve Outcome: Progressing   Problem: Activity: Goal: Interest or engagement in activities will improve Outcome: Progressing   Problem: Coping: Goal: Ability to verbalize frustrations and anger appropriately will improve Outcome: Progressing   Problem: Safety: Goal: Periods of time without injury will increase Outcome: Progressing

## 2023-09-11 NOTE — Plan of Care (Signed)
  Problem: Education: Goal: Knowledge of [facility] Education information/materials will improve 09/11/2023 1946 by Jearl Klinefelter, RN Outcome: Progressing 09/11/2023 1943 by Jearl Klinefelter, RN Outcome: Progressing Goal: Emotional status will improve 09/11/2023 1946 by Jearl Klinefelter, RN Outcome: Progressing 09/11/2023 1943 by Jearl Klinefelter, RN Outcome: Progressing Goal: Mental status will improve 09/11/2023 1946 by Jearl Klinefelter, RN Outcome: Progressing 09/11/2023 1943 by Jearl Klinefelter, RN Outcome: Progressing Goal: Verbalization of understanding the information provided will improve 09/11/2023 1946 by Jearl Klinefelter, RN Outcome: Progressing 09/11/2023 1943 by Jearl Klinefelter, RN Outcome: Progressing   Problem: Activity: Goal: Interest or engagement in activities will improve 09/11/2023 1946 by Jearl Klinefelter, RN Outcome: Progressing 09/11/2023 1943 by Jearl Klinefelter, RN Outcome: Progressing Goal: Sleeping patterns will improve 09/11/2023 1946 by Jearl Klinefelter, RN Outcome: Progressing 09/11/2023 1943 by Jearl Klinefelter, RN Outcome: Progressing   Problem: Coping: Goal: Ability to verbalize frustrations and anger appropriately will improve 09/11/2023 1946 by Jearl Klinefelter, RN Outcome: Progressing 09/11/2023 1943 by Jearl Klinefelter, RN Outcome: Progressing Goal: Ability to demonstrate self-control will improve 09/11/2023 1946 by Jearl Klinefelter, RN Outcome: Progressing 09/11/2023 1943 by Jearl Klinefelter, RN Outcome: Progressing   Problem: Health Behavior/Discharge Planning: Goal: Identification of resources available to assist in meeting health care needs will improve 09/11/2023 1946 by Jearl Klinefelter, RN Outcome: Progressing 09/11/2023 1943 by Jearl Klinefelter, RN Outcome: Progressing Goal: Compliance with treatment plan for underlying cause of condition will improve 09/11/2023 1946 by Jearl Klinefelter,  RN Outcome: Progressing 09/11/2023 1943 by Jearl Klinefelter, RN Outcome: Progressing   Problem: Physical Regulation: Goal: Ability to maintain clinical measurements within normal limits will improve 09/11/2023 1946 by Jearl Klinefelter, RN Outcome: Progressing 09/11/2023 1943 by Jearl Klinefelter, RN Outcome: Progressing   Problem: Safety: Goal: Periods of time without injury will increase 09/11/2023 1946 by Jearl Klinefelter, RN Outcome: Progressing 09/11/2023 1943 by Jearl Klinefelter, RN Outcome: Progressing

## 2023-09-12 NOTE — Group Note (Signed)
Occupational Therapy Group Note  Group Topic: Sleep Hygiene  Group Date: 09/12/2023 Start Time: 1430 End Time: 1500 Facilitators: Ted Mcalpine, OT   Group Description: Group encouraged increased participation and engagement through topic focused on sleep hygiene. Patients reflected on the quality of sleep they typically receive and identified areas that need improvement. Group was given background information on sleep and sleep hygiene, including common sleep disorders. Group members also received information on how to improve one's sleep and introduced a sleep diary as a tool that can be utilized to track sleep quality over a length of time. Group session ended with patients identifying one or more strategies they could utilize or implement into their sleep routine in order to improve overall sleep quality.        Therapeutic Goal(s):  Identify one or more strategies to improve overall sleep hygiene  Identify one or more areas of sleep that are negatively impacted (sleep too much, too little, etc)     Participation Level: Engaged   Participation Quality: Independent   Behavior: Appropriate   Speech/Thought Process: Relevant   Affect/Mood: Appropriate   Insight: Fair   Judgement: Fair      Modes of Intervention: Education  Patient Response to Interventions:  Attentive   Plan: Continue to engage patient in OT groups 2 - 3x/week.  09/12/2023  Ted Mcalpine, OT   Kerrin Champagne, OT

## 2023-09-12 NOTE — Plan of Care (Signed)
  Problem: Education: Goal: Emotional status will improve Outcome: Progressing Goal: Mental status will improve Outcome: Progressing   

## 2023-09-12 NOTE — [facility] Group Notes (Signed)
Spiritual care group on grief and loss facilitated by Chaplain Dyanne Carrel, [facility]  Group Goal: Support / Education around grief and loss  Members engage in facilitated group support and psycho-social education.  Group Description:  Following introductions and group rules, group members engaged in facilitated group dialogue and support around topic of loss, with particular support around experiences of loss in their lives. Group Identified types of loss (relationships / self / things) and identified patterns, circumstances, and changes that precipitate losses. Reflected on thoughts / feelings around loss, normalized grief responses, and recognized variety in grief experience. Group encouraged individual reflection on safe space and on the coping skills that they are already utilizing.  Group drew on Adlerian / Rogerian and narrative framework  Patient Progress: Michelle Levy attended group and actively engaged and participated in group conversation and activities. She brought what she called "messages of hope" to the group.
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
## 2023-09-12 NOTE — Progress Notes (Signed)
   09/12/23 0900  Psych Admission Type (Psych Patients Only)  Admission Status Involuntary  Psychosocial Assessment  Patient Complaints Anxiety  Eye Contact Fair  Facial Expression Anxious  Affect Anxious  Speech Logical/coherent  Interaction Assertive  Motor Activity Restless  Appearance/Hygiene Unremarkable  Behavior Characteristics Cooperative  Mood Anxious  Thought Process  Coherency Circumstantial  Content Preoccupation  Delusions None reported or observed  Perception WDL  Hallucination None reported or observed  Judgment Impaired  Confusion None  Danger to Self  Current suicidal ideation? Denies  Agreement Not to Harm Self Yes  Description of Agreement verbal  Danger to Others  Danger to Others None reported or observed   Patient stated that " my daughter sent me here, but it has been a good experience, I needed to come here".

## 2023-09-12 NOTE — Group Note (Signed)
Recreation Therapy Group Note   Group Topic:Health and Wellness  Group Date: 09/12/2023 Start Time: 0930 End Time: 1005 Facilitators: Reneisha Stilley-McCall, LRT,CTRS Location: [address] Dayroom   Group Topic: Exercise/Wellness  Goal Area(s) Addresses:  Patient will verbalize benefit of exercise during group session. Patient will identify an exercise that can be completed post d/c. Patient will acknowledge benefits of exercise when used as a coping mechanism.   Group Description: Patients and LRT discussed the importance of physical exercise and its benefits. During group, patients took turns leading the group in the exercises/stretches of their choosing. Patients completed three rounds of exercise. Patients could get water or take a break if needed.  Education: Physical Activity, Health and Wellness  Education Outcome: Acknowledges understanding/In group clarification offered/Needs additional education.    Affect/Mood: N/A   Participation Level: Did not attend    Clinical Observations/Individualized Feedback:     Plan: Continue to engage patient in RT group sessions 2-3x/week.   Michelle Levy, LRT,CTRS  09/12/2023 11:44 AM

## 2023-09-12 NOTE — [facility] Group Notes (Signed)
The focus of this group is to help patients establish daily goals to achieve during treatment and discuss how the patient can incorporate goal setting into their daily lives to aide in recovery.   Pt attended and contributed to group

## 2023-09-12 NOTE — [facility] Group Notes (Signed)
[facility] Group Notes:  (Nursing/MHT/Case Management/Adjunct)  Date:  09/12/2023  Time:  2000  Type of Therapy:   AA Group  Participation Level:  Active  Participation Quality:  Appropriate and Attentive  Affect:  Appropriate  Cognitive:  Alert and Appropriate  Insight:  Appropriate  Engagement in Group:  Engaged  Modes of Intervention:  Discussion and Support  Summary of Progress/Problems:  Fay Records 09/12/2023, 9:32 PM

## 2023-09-13 LAB — COMPREHENSIVE METABOLIC PANEL
ALT: 20 U/L (ref 0–44)
AST: 22 U/L (ref 15–41)
Albumin: 4 g/dL (ref 3.5–5.0)
Alkaline Phosphatase: 66 U/L (ref 38–126)
Anion gap: 11 (ref 5–15)
BUN: 15 mg/dL (ref 8–23)
CO2: 26 mmol/L (ref 22–32)
Calcium: 8.9 mg/dL (ref 8.9–10.3)
Chloride: 96 mmol/L — ABNORMAL LOW (ref 98–111)
Creatinine, Ser: 0.79 mg/dL (ref 0.44–1.00)
GFR, Estimated: >60 mL/min (ref >60)
Glucose, Bld: 177 mg/dL — ABNORMAL HIGH (ref 70–99)
Potassium: 4.4 mmol/L (ref 3.5–5.1)
Sodium: 133 mmol/L — ABNORMAL LOW (ref 135–145)
Total Bilirubin: 0.6 mg/dL (ref <1.2)
Total Protein: 7 g/dL (ref 6.5–8.1)

## 2023-09-13 LAB — VITAMIN B12: Vitamin B-12: 774 pg/mL (ref 180–914)

## 2023-09-13 LAB — VITAMIN D 25 HYDROXY (VIT D DEFICIENCY, FRACTURES): Vit D, 25-Hydroxy: 56.11 ng/mL (ref 30–100)

## 2023-09-13 LAB — FOLATE: Folate: 21.3 ng/mL (ref >5.9)

## 2023-09-13 MED ORDER — OLANZAPINE 7.5 MG PO TABS
15.0000 mg | ORAL_TABLET | Freq: Every day | ORAL | Status: DC
  Administered 2023-09-13 – 2023-09-19 (×7): 15 mg via ORAL
  Filled 2023-09-13 (×9): qty 2

## 2023-09-13 NOTE — Group Note (Signed)
Date:  09/13/2023 Time:  9:43 AM  Group Topic/Focus:  Goals Group:   The focus of this group is to help patients establish daily goals to achieve during treatment and discuss how the patient can incorporate goal setting into their daily lives to aide in recovery.    Participation Level:  Active  Participation Quality:  Appropriate  Affect:  Appropriate  Cognitive:  Alert  Insight: Good  Engagement in Group:  Engaged  Modes of Intervention:  Discussion  Additional Comments:    Beckie Busing 09/13/2023, 9:43 AM

## 2023-09-13 NOTE — Plan of Care (Signed)

## 2023-09-13 NOTE — Plan of Care (Signed)
?  Problem: Education: ?Goal: Mental status will improve ?Outcome: Progressing ?Goal: Verbalization of understanding the information provided will improve ?Outcome: Progressing ?  ?

## 2023-09-13 NOTE — Progress Notes (Signed)
Pt given a pitcher of gatorade and encouraged to drink. Pt verbalized understanding.

## 2023-09-13 NOTE — Progress Notes (Signed)
   09/13/23 1200  Psych Admission Type (Psych Patients Only)  Admission Status Involuntary  Psychosocial Assessment  Patient Complaints Anxiety  Eye Contact Fair  Facial Expression Animated;Anxious  Affect Anxious  Speech Tangential  Interaction Assertive  Motor Activity Restless  Appearance/Hygiene Unremarkable  Behavior Characteristics Cooperative  Mood Pleasant;Euthymic  Thought Process  Coherency Circumstantial  Content Preoccupation  Delusions None reported or observed  Perception WDL  Hallucination None reported or observed  Judgment Impaired  Confusion None  Danger to Self  Current suicidal ideation? Denies  Agreement Not to Harm Self Yes  Description of Agreement verbal contract  Danger to Others  Danger to Others None reported or observed

## 2023-09-13 NOTE — Group Note (Signed)
Date:  09/13/2023 Time:  9:47 AM  Group Topic/Focus:  Developing a Wellness Toolbox:   The focus of this group is to help patients develop a "wellness toolbox" with skills and strategies to promote recovery upon discharge.    Participation Level:  Active  Participation Quality:  Appropriate  Affect:  Appropriate  Cognitive:  Alert  Insight: Good  Engagement in Group:  Engaged  Modes of Intervention:  Education  Additional Comments:    Beckie Busing 09/13/2023, 9:47 AM

## 2023-09-13 NOTE — Progress Notes (Addendum)
[facility] MD Progress Note  09/13/2023 12:08 PM Michelle Levy  MRN:  [id number]  Principal Problem: Bipolar affective disorder, current episode manic (HCC) Diagnosis: Principal Problem:   Bipolar affective disorder, current episode manic (HCC)  Reason for Admission:  Michelle Levy is a 67 year old female with a past psychiatric history of bipolar 1 disorder, manic, requiring hospitalization; alcohol use disorder, without withdrawal symptoms; and PTSD who was admitted to [facility] for an acute manic episode, with questionable psychotic features.   Information discussed at bed report: Slept 7 hours. No issues. Some confusion.  PRNs needed: none.  Information Obtained Today During Patient Interview: Patient is "feeling good" today. Eating well. Somewhat tangential. Denied SI, HI and AVH. Eating well. Asked about PO intake -- patient drinks a lot of water at home, makes a point to drink 30+ oz a day. Informed patient we would be drawing blood to check her sodium levels. Patient amenable. Asked Clinical research associate to call a friend of hers to update her on the plan, whom patient has given Clinical research associate permission to speak to. Patient would like to be discharged soon to get home for thanksgiving.  On reinterview, patient amenable to staying for further organic workup including imaging and bloodwork, medication adjustments. Repeated that she is here because she had a "break through" in which God talks to her through touch ("listening through my body'), and that her children believed that she was a danger to herself. Drinks 2.5 glasses of wine a night. Confirmed that sister had bipolar disorder. Paster to visit this afternoon.   Collateral per friend, Roney Marion [phone]): Neco believes she's a peer support specialist, and that she's here to help for that. Visited Saturday and wanting to know where my friend went to. Was acting frankly psychotic: "god gets excited sometimes." Fixates on facebook. Daughter is overwhelmed.  She *might* stay on her medicine. Went to a new church and this became her everything.  Patient "babbles all the time at baseline" and most of her friends have left. They can't handle her. Lots of "busy chatter". Fixated on herself. Major changes over the past two years. She can typically maintain a house but she has been making mistakes at work, paranoid about people getting into her bank account. Believes she needs a higher level of care, somewhere that family can pay for. Will discuss this with family. Unlikely that daughter will be able to look after patient and patient's dogs. Serious psychiatric illness in the family. Agitated with friend this morning -- mad that friend took patient and her special powers away from where she needed to be. This time is different -- it's way worse.   Past Psychiatric History:   Current psychiatrist: none. Current therapist: Previously saw pastor at her church, counselor with ex-husband Previous psychiatric diagnoses: Bipolar disorder, mixed, with psychotic features Current psychiatric medications: denied. Psychiatric medication history/compliance:  Started on Lamictal/Seroquel in 2017, stopped approximately 1 year afterwards, did not resume. Psychiatric hospitalization(s):  2017, after a manic episode with psychosis similar to current presentation p. Psychotherapy history: none. Neuromodulation history: none. History of suicide (obtained from HPI): none. History of homicide or aggression (obtained in HPI): none.  Family Psychiatric History:   Mother: Nervous breakdown requiring hospitalization Sister: Bipolar disorder Sister: Depression Father: Alcoholism   Suicide history: none.  Violence/aggression: none. Substance use history:  EtOH-dad.  Social History:   Living situation:  Lives with self. 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 Medical diagnoses: None Medications: Patient takes no prescribed medications -- Allergies: none. Hospitalizations:  None. Surgeries:  C-section, D&C. Trauma: denied. Seizures: denied.  Family History:  Family History  Problem Relation Age of Onset   Alcohol abuse Mother    Bipolar disorder Mother    Alcohol abuse Father    Depression Sister    Bipolar disorder Sister    Breast cancer Neg Hx     Current Medications: Current Facility-Administered Medications  Medication Dose Route Frequency Provider Last Rate Last Admin   acetaminophen (TYLENOL) tablet 650 mg  650 mg Oral Q6H PRN Rankin, Shuvon B, NP       alum & mag hydroxide-simeth (MAALOX/MYLANTA) 200-200-20 MG/5ML suspension 30 mL  30 mL Oral Q4H PRN Rankin, Shuvon B, NP       hydrOXYzine (ATARAX) tablet 25 mg  25 mg Oral TID PRN Rankin, Shuvon B, NP       magnesium hydroxide (MILK OF MAGNESIA) suspension 30 mL  30 mL Oral Daily PRN Rankin, Shuvon B, NP       multivitamin with minerals tablet 1 tablet  1 tablet Oral Daily Rankin, Shuvon B, NP   1 tablet at 09/13/23 0814   OLANZapine (ZYPREXA) tablet 10 mg  10 mg Oral QHS Kizzie Ide B, MD   10 mg at 09/12/23 2106   OLANZapine zydis (ZYPREXA) disintegrating tablet 5 mg  5 mg Oral TID PRN Rankin, Shuvon B, NP       traZODone (DESYREL) tablet 50 mg  50 mg Oral QHS PRN Rankin, Shuvon B, NP       vitamin D3 (CHOLECALCIFEROL) tablet 1,000 Units  1,000 Units Oral Daily Rankin, Shuvon B, NP   1,000 Units at 09/13/23 0814    Lab Results: No results found for this or any previous visit (from the past 48 hour(s)).  Blood Alcohol level:  Lab Results  Component Value Date   ETH <10 09/06/2023   ETH 159 (H) 11/20/2015    Metabolic Labs: Lab Results  Component Value Date   HGBA1C 5.3 09/06/2023   MPG 105.41 09/06/2023   MPG 108 11/22/2015   Lab Results  Component  Value Date   PROLACTIN 14.9 11/22/2015   Lab Results  Component Value Date   CHOL 198 09/06/2023   TRIG 29 09/06/2023   HDL 107 09/06/2023   CHOLHDL 1.9 09/06/2023   VLDL 6 09/06/2023   LDLCALC 85 09/06/2023   LDLCALC 101 (H) 11/22/2015    Physical Findings: AIMS: No  Psychiatric Specialty Exam:  Presentation  General Appearance: Appropriate for Environment  Eye Contact:Fair  Speech:Clear and Coherent  Speech Volume:Normal  Handedness:Right   Mood and Affect  Mood:Euthymic  Affect:Appropriate   Thought Process  Thought Processes:Linear; Disorganized  Descriptions of Associations:Circumstantial  Orientation:Full (Time, Place and Person)  Thought Content:Illogical; Paranoid Ideation  History of Schizophrenia/Schizoaffective disorder:No   Hallucinations:Hallucinations: None   Ideas of Reference:None  Suicidal Thoughts:Suicidal Thoughts: No  Homicidal Thoughts:Homicidal Thoughts: No   Sensorium  Memory:Immediate Good; Recent Good; Remote Good  Judgment:Impaired  Insight:Poor   Executive Functions  Concentration:Fair  Attention Span:Fair  Recall:Fair  Fund of Knowledge:Fair  Language:Fair   Psychomotor Activity  Psychomotor Activity: Normal   Assets  Desire for Improvement; Manufacturing systems engineer; Resilience; Financial Resources/Insurance; Social Support   Sleep  Sleep: Good Number of Hours of Sleep: 7  Physical Exam Constitutional:      General: She is not in acute distress.    Appearance: Normal appearance. She is normal weight. She is not ill-appearing.  HENT:     Head: Atraumatic.  Eyes:     Extraocular Movements: Extraocular movements intact.  Pulmonary:     Effort: Pulmonary effort is normal. No respiratory distress.  Musculoskeletal:        General: No deformity. Normal range of motion.  Neurological:     General: No focal deficit present.     Mental Status: Mental status is at baseline.    Review of Systems   All other systems reviewed and are negative.  Blood pressure 119/76, pulse 82, temperature 98.3 F (36.8 C), temperature source Oral, resp. rate 16, height 5\' 3"  (1.6 m), weight 56.2 kg, SpO2 98%. Body mass index is 21.97 kg/m.  Treatment Plan Summary: Daily contact with patient to assess and evaluate symptoms and progress in treatment and Medication management   ASSESSMENT:  Michelle Levy is a 66 year old female with a past psychiatric history of bipolar 1 disorder, manic, requiring hospitalization; alcohol use disorder, without withdrawal symptoms; and PTSD who was admitted to [facility] for an acute manic episode, with questionable psychotic features.   Diagnoses / Active Problems: Bipolar 1 disorder, in manic episode, with psychotic features Alcohol use disorder Posttraumatic stress disorder  PLAN: Safety and Monitoring:  --  INVOLUNTARY admission to inpatient psychiatric unit for safety, stabilization and treatment  -- Daily contact with patient to assess and evaluate symptoms and progress in treatment  -- Patient's case to be discussed in multi-disciplinary team meeting  -- Observation Level : q15 minute checks  -- Vital signs:  q12 hours  -- Precautions: suicide, elopement, and assault  2. Psychiatric Diagnoses and Treatment:  # Bipolar 1 disorder, and manic episode, with psychotic features # Alcohol use disorder # PTSD - Increase Zyprexa 10 mg --> 15 mg nightly for mania. - Ordered TSH/B1/B9/B12/VitD and repeat CMP for further organic workup - Currently on fluid restriction -- with meals only, substitute water for gatoraid -- until follow-up CMP results.  - Options for D/C: discharge home with HH vs ACT team, neurology outpt with MRI. Will consider starting low-dose Depakote before discharge in 2-3 days.  - The risks/benefits/side-effects/alternatives to this medication were discussed in detail with the patient and time was given for questions. The patient consents to medication  trial.  - Metabolic profile and EKG monitoring obtained while on an atypical antipsychotic  BMI: 21.97 TSH: 1.128 Lipid panel: LDL 85 HbgA1c: 5.3 QTc: 415 - Encouraged patient to participate in unit milieu and in scheduled group therapies  - Short Term Goals: Ability to identify changes in lifestyle to reduce recurrence of condition will improve, Ability to verbalize feelings will improve, Ability to disclose and discuss suicidal ideas, Ability to maintain clinical measurements within normal limits will improve, and Compliance with prescribed medications will improve - Long Term Goals: Improvement in symptoms so as ready for discharge   Other PRNS: Agitation, constipation, sleep   Other labs reviewed on admission: As above   3. Medical Issues Being Addressed:   #Hyponatremia - Repeat CMP today, fluid restriction as above  4. Discharge Planning:   -- Social work and case management to assist with discharge planning and identification of hospital follow-up needs prior to discharge  -- Estimated Discharge Date: 2-3 days  -- Discharge Concerns: Need to establish a safety plan; Medication compliance and effectiveness  -- Discharge Goals: Return  home with outpatient referrals for mental health follow-up including medication management/psychotherapy   I certify that inpatient services furnished can reasonably be expected to improve the patient's condition.    Luiz Iron, MD PGY-1 Psychiatry  11/26/202412:08 PM

## 2023-09-13 NOTE — Group Note (Signed)
[facility] LCSW Group Therapy Note   Group Date: 09/13/2023 Start Time: 1100 End Time: 1200  Type of Therapy:  Group Therapy  Participation Level:  Active  Participation Quality:  Appropriate  Affect:  Appropriate  Cognitive:  Appropriate  Insight:  Developing/Improving  Engagement in Therapy:  Developing/Improving  Modes of Intervention:  Activity, Discussion, Rapport Building, Socialization and Support  Summary of Progress/Problems: Patient actively participated in group on today. Group started off with introductions and group rules. Group members participated in a therapeutic activity that required active listening and communication skills. Group members were able to identify similarities and differences within the group. Patient was very insight and provided great feedback to the group regarding setting boundaries and partaking in a journey to discover more of who you are. Patient reports her plan moving forward is to speak only when she needs to and not to disclose too much information regarding her life, as she believes that has been one of her biggest issues. Patient interacted positively with staff and peers. No issues to report.   Loleta Dicker, LCSW

## 2023-09-13 NOTE — Progress Notes (Signed)
Pt encouraged to only drink fluids with meals and to replace water with gatorade. Pt verbalized understanding.

## 2023-09-13 NOTE — Progress Notes (Signed)
   09/12/23 2200  Psych Admission Type (Psych Patients Only)  Admission Status Involuntary  Psychosocial Assessment  Patient Complaints Anxiety  Eye Contact Fair  Facial Expression Anxious  Affect Anxious  Speech Logical/coherent  Interaction Assertive  Motor Activity Restless  Appearance/Hygiene Unremarkable  Behavior Characteristics Cooperative  Mood Anxious  Thought Process  Coherency Circumstantial  Content Preoccupation  Delusions None reported or observed  Perception WDL  Hallucination None reported or observed  Judgment Impaired  Confusion None  Danger to Self  Current suicidal ideation? Denies  Agreement Not to Harm Self Yes  Description of Agreement Verbal  Danger to Others  Danger to Others None reported or observed

## 2023-09-13 NOTE — Group Note (Signed)
Recreation Therapy Group Note   Group Topic:Animal Assisted Therapy   Group Date: 09/13/2023 Start Time: 0950 End Time: 1030 Facilitators: Josiane Labine-McCall, LRT,CTRS Location: [address]   Animal-Assisted Activity (AAA) Program Checklist/Progress Notes Patient Eligibility Criteria Checklist & Daily Group note for Rec Tx Intervention  AAA/T Program Assumption of Risk Form signed by Patient/ or Parent Legal Guardian Yes  Patient is free of allergies or severe asthma Yes  Patient reports no fear of animals Yes  Patient reports no history of cruelty to animals Yes  Patient understands his/her participation is voluntary Yes  Patient washes hands before animal contact Yes  Patient washes hands after animal contact Yes  Education: Hand Washing, Appropriate Animal Interaction   Education Outcome: Acknowledges education.    Affect/Mood: Appropriate   Participation Level: Engaged   Participation Quality: Independent   Behavior: Appropriate   Speech/Thought Process: Focused   Insight: Good   Judgement: Good   Modes of Intervention: Teaching laboratory technician   Patient Response to Interventions:  Engaged   Education Outcome:  In group clarification offered    Clinical Observations/Individualized Feedback: Patient attended session and interacted appropriately with therapy dog and peers. Patient asked appropriate questions about therapy dog and his training. Patient shared stories about their pets at home with group.     Plan: Continue to engage patient in RT group sessions 2-3x/week.   Tammie Yanda-McCall, LRT,CTRS  09/13/2023 12:20 PM

## 2023-09-14 ENCOUNTER — Encounter (HOSPITAL_COMMUNITY): Payer: Self-pay

## 2023-09-14 MED ORDER — DIVALPROEX SODIUM ER 250 MG PO TB24
250.0000 mg | ORAL_TABLET | Freq: Two times a day (BID) | ORAL | Status: DC
  Administered 2023-09-14 – 2023-09-18 (×8): 250 mg via ORAL
  Filled 2023-09-14 (×10): qty 1

## 2023-09-14 NOTE — Group Note (Signed)
Recreation Therapy Group Note   Group Topic:Team Building  Group Date: 09/14/2023 Start Time: 0935 End Time: 1015 Facilitators: Roan Miklos-McCall, LRT,CTRS Location: [address]   Group Topic: Communication, Team Building, Problem Solving  Goal Area(s) Addresses:  Patient will effectively work with peer towards shared goal.  Patient will identify skills used to make activity successful.  Patient will identify how skills used during activity can be used to reach post d/c goals.   Intervention: STEM Activity  Group Description: Straw Bridge. In teams of 3-5, patients were given 15 plastic drinking straws and an equal length of masking tape. Using the materials provided, patients were instructed to build a free standing bridge-like structure to suspend an everyday item (ex: puzzle box) off of the floor or table surface. All materials were required to be used by the team in their design. LRT facilitated post-activity discussion reviewing team process. Patients were encouraged to reflect how the skills used in this activity can be generalized to daily life post discharge.   Education: Pharmacist, community, Scientist, physiological, Discharge Planning   Education Outcome: Acknowledges education/In group clarification offered/Needs additional education.    Affect/Mood: Appropriate   Participation Level: Engaged   Participation Quality: Independent   Behavior: Appropriate   Speech/Thought Process: Focused   Insight: Good   Judgement: Good   Modes of Intervention: STEM Activity   Patient Response to Interventions:  Engaged   Education Outcome:  In group clarification offered    Clinical Observations/Individualized Feedback: Pt was engaged and worked well with peers. Pt offered suggestions on how to make the structure sturdy and its overall build. Pt was bright and focused throughout group.      Plan: Continue to engage patient in RT group sessions 2-3x/week.   Davionne Mastrangelo-McCall, LRT,CTRS  09/14/2023 12:23 PM

## 2023-09-14 NOTE — Plan of Care (Signed)

## 2023-09-14 NOTE — [facility] Group Notes (Signed)
Pt did not attend NA group 

## 2023-09-14 NOTE — Progress Notes (Signed)
[facility] MD Progress Note  09/14/2023 12:16 PM Michelle Levy  MRN:  [id number]  Principal Problem: Bipolar affective disorder, current episode manic (HCC) Diagnosis: Principal Problem:   Bipolar affective disorder, current episode manic (HCC)  Reason for Admission:  Michelle Levy is a 67 year old female with a past psychiatric history of bipolar 1 disorder, manic, requiring hospitalization; alcohol use disorder, without withdrawal symptoms; and PTSD who was admitted to [facility] for an acute manic episode, with questionable psychotic features.   Information discussed at bed report: Slept 7.25 hours.  Denied SI, HI, AVH.  Vitals within normal limits.  PRNs needed: none.  Information Obtained Today During Patient Interview:   Patient somewhat more withdrawn today.  Describes mood as "good."  Spoke with pastor, who came by earlier yesterday evening.  Patient admitted that she "acted a little strange" but that was because the "chatter was too much."  She said that at the time she got a little response in her body (from God) and went into the trees.  Overall, however, felt that the meeting went well.  Is interested in further discharge.  Eating well. Denies homicidal ideation, suicidal ideation, auditory and visual hallucinations.  Is comfortable with an ACT team to check on her at home and deliver medications to her.  Also volunteered that her friends were willing to check on her.  On collateral call to daughter, Michelle Levy [phone]): Daughter is not comfortable with plan to discharge patient to her home alone while she is actively delusional.  Counseled daughter that we have up titrated patient's Zyprexa to a therapeutic dosage and that she does not present active harm to herself.  Discussed limits of inpatient hospitalization for further patient care.  Per daughter, ex-husband Michelle Levy does not live close to patient and that she lives approximately 30 minutes away from everyone.  On collateral  call with friend, Michelle Levy [phone]): Collateral was extremely doubtful that daily check-in's from friends will be sufficient to control/monitor patient's behavior.  Has spoken to patient recently, who is actively delusional per friend.  Has already voiced friend intends of stopping her medication and doing "a lot of stuff" including driving long distances after discharge.  Friend has contacted patient's neighbors/ex-husband, neither of whom feel equipped to monitor patient this regularly.  Also notes ongoing personal emergency, which is drawing attention from patient's situation.  Per friend, family is not willing to pay for an ALF-it is doubtful that patient would participate in process of moving to an ALF, as she does not believe that there is anything wrong with her.  Past Psychiatric History:   Current psychiatrist: none. Current therapist: Previously saw pastor at her church, counselor with ex-husband Previous psychiatric diagnoses: Bipolar disorder, mixed, with psychotic features Current psychiatric medications: denied. Psychiatric medication history/compliance:  Started on Lamictal/Seroquel in 2017, stopped approximately 1 year afterwards, did not resume. Psychiatric hospitalization(s):  2017, after a manic episode with psychosis similar to current presentation p. Psychotherapy history: none. Neuromodulation history: none. History of suicide (obtained from HPI): none. History of homicide or aggression (obtained in HPI): none.  Family Psychiatric History:   Mother: Nervous breakdown requiring hospitalization Sister: Bipolar disorder Sister: Depression Father: Alcoholism   Suicide history: none.  Violence/aggression: none. Substance use history:  EtOH-dad.  Social History:   Living situation:  Lives with self. 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 Medical diagnoses: None Medications: Patient takes no prescribed medications -- Allergies: none. Hospitalizations:  None. Surgeries:  C-section, D&C. Trauma: denied. Seizures: denied.  Family History:  Family History  Problem Relation Age of Onset   Alcohol abuse Mother    Bipolar disorder Mother    Alcohol abuse Father    Depression Sister    Bipolar disorder Sister    Breast cancer Neg Hx     Current Medications: Current Facility-Administered Medications  Medication Dose Route Frequency Provider Last Rate Last Admin   acetaminophen (TYLENOL) tablet 650 mg  650 mg Oral Q6H PRN Rankin, Shuvon B, NP       alum & mag hydroxide-simeth (MAALOX/MYLANTA) 200-200-20 MG/5ML suspension 30 mL  30 mL Oral Q4H PRN Rankin, Shuvon B, NP       hydrOXYzine (ATARAX) tablet 25 mg  25 mg Oral TID PRN Rankin, Shuvon B, NP       magnesium hydroxide (MILK OF MAGNESIA) suspension 30 mL  30 mL Oral Daily PRN Rankin, Shuvon B, NP       multivitamin with minerals tablet 1 tablet  1 tablet Oral Daily Rankin, Shuvon B, NP   1 tablet at 09/14/23 0801   OLANZapine (ZYPREXA) tablet 15 mg  15 mg Oral Devoria Glassing, MD   15 mg at 09/13/23 2046   OLANZapine zydis (ZYPREXA) disintegrating tablet 5 mg  5 mg Oral TID PRN Rankin, Shuvon B, NP       traZODone (DESYREL) tablet 50 mg  50 mg Oral QHS PRN Rankin, Shuvon B, NP       vitamin D3 (CHOLECALCIFEROL) tablet 1,000 Units  1,000 Units Oral Daily Rankin, Shuvon B, NP   1,000 Units at 09/14/23 0801    Lab Results:  Results for orders placed or performed during the hospital encounter of 09/07/23 (from the past 48 hour(s))  Comprehensive metabolic panel     Status: Abnormal   Collection Time: 09/13/23  6:34 PM  Result Value Ref Range   Sodium 133 (L) 135 - 145 mmol/L   Potassium 4.4 3.5 - 5.1 mmol/L   Chloride 96 (L) 98 - 111 mmol/L   CO2 26  22 - 32 mmol/L   Glucose, Bld 177 (H) 70 - 99 mg/dL    Comment: Glucose reference range applies only to samples taken after fasting for at least 8 hours.   BUN 15 8 - 23 mg/dL   Creatinine, Ser 1.61 0.44 - 1.00 mg/dL   Calcium 8.9 8.9 - 09.6 mg/dL   Total Protein 7.0 6.5 - 8.1 g/dL   Albumin 4.0 3.5 - 5.0 g/dL   AST 22 15 - 41 U/L   ALT 20 0 - 44 U/L   Alkaline Phosphatase 66 38 - 126 U/L   Total Bilirubin 0.6 <1.2 mg/dL   GFR, Estimated >04 >54 mL/min    Comment: (NOTE) Calculated using the CKD-EPI Creatinine Equation (2021)    Anion gap 11 5 - 15    Comment: Performed at [facility], [address], [place], [place] [address]  Vitamin B12     Status: None   Collection Time: 09/13/23  6:34 PM  Result Value Ref Range   Vitamin B-12 774 180 - 914 pg/mL    Comment: (NOTE) This assay is not validated for testing neonatal or myeloproliferative syndrome specimens for Vitamin B12 levels. Performed at [facility], [address]  [place], [place] [address]   Folate     Status: None   Collection Time: 09/13/23  6:34 PM  Result Value Ref Range   Folate 21.3 >5.9 ng/mL    Comment: RESULT CONFIRMED BY MANUAL DILUTION Performed at [facility], [address], [place], [place] [address]   VITAMIN D 25 Hydroxy (Vit-D Deficiency, Fractures)     Status: None   Collection Time: 09/13/23  6:34 PM  Result Value Ref Range   Vit D, 25-Hydroxy 56.11 30 - 100 ng/mL    Comment: (NOTE) Vitamin D deficiency has been defined by the Institute of Medicine  and an Endocrine Society practice guideline as a level of serum 25-OH  vitamin D less than 20 ng/mL (1,2). The Endocrine Society went on to  further define vitamin D insufficiency as a level between 21 and 29  ng/mL (2).  1. IOM (Institute of Medicine). 2010. Dietary reference intakes for  calcium and D. [place]: [employer]. 2. Holick MF, Binkley ,  Bischoff-Ferrari HA, et al. Evaluation,  treatment, and prevention of vitamin D deficiency: an Endocrine  Society clinical practice guideline, JCEM. 2011 Jul; 96(7): 1911-30.  Performed at [facility], [address], [place], [place] [address]     Blood Alcohol level:  Lab Results  Component Value Date   ETH <10 09/06/2023   ETH 159 (H) 11/20/2015    Metabolic Labs: Lab Results  Component Value Date   HGBA1C 5.3 09/06/2023   MPG 105.41 09/06/2023   MPG 108 11/22/2015   Lab Results  Component Value Date   PROLACTIN 14.9 11/22/2015   Lab Results  Component Value Date   CHOL 198 09/06/2023   TRIG 29 09/06/2023   HDL 107 09/06/2023   CHOLHDL 1.9 09/06/2023   VLDL 6 09/06/2023   LDLCALC 85 09/06/2023   LDLCALC 101 (H) 11/22/2015    Physical Findings: AIMS: No  Psychiatric Specialty Exam:  Presentation  General Appearance: Appropriate for Environment  Eye Contact:Good  Speech:Clear and Coherent  Speech Volume:Normal  Handedness:Right   Mood and Affect  Mood:Euthymic  Affect:Appropriate   Thought Process  Thought Processes:Linear  Descriptions of Associations:Intact  Orientation:Full (Time, Place and Person)  Thought Content:Logical; Delusions  History of Schizophrenia/Schizoaffective disorder:No   Hallucinations:Hallucinations: None   Ideas of Reference:None  Suicidal Thoughts:Suicidal Thoughts: No  Homicidal Thoughts:Homicidal Thoughts: No   Sensorium  Memory:Immediate Good; Recent Good; Remote Good  Judgment:Impaired  Insight:Poor   Executive Functions  Concentration:Fair  Attention Span:Fair  Recall:Fair  Fund of Knowledge:Fair  Language:Fair   Psychomotor Activity  Psychomotor Activity: Normal   Assets  Desire for Improvement; Manufacturing systems engineer; Resilience; Financial Resources/Insurance; Social Support   Sleep  Sleep: Good Number of Hours of Sleep: 7.25  Physical Exam Constitutional:       General: She is not in acute distress.    Appearance: Normal appearance. She is normal weight. She is not ill-appearing.  HENT:     Head: Atraumatic.  Eyes:     Extraocular Movements: Extraocular movements intact.  Pulmonary:     Effort: Pulmonary effort is normal. No respiratory distress.  Musculoskeletal:        General: No deformity. Normal range of motion.  Neurological:     General: No focal deficit present.     Mental Status: Mental status is at baseline.    Review of Systems  All other systems reviewed and are negative.  Blood pressure 110/67, pulse 73, temperature 98 F (36.7  C), temperature source Oral, resp. rate 16, height 5\' 3"  (1.6 m), weight 56.2 kg, SpO2 100%. Body mass index is 21.97 kg/m.  Treatment Plan Summary: Daily contact with patient to assess and evaluate symptoms and progress in treatment and Medication management   ASSESSMENT:  Leenah Kappeler is a 67 year old female with a past psychiatric history of bipolar 1 disorder, manic, requiring hospitalization; alcohol use disorder, without withdrawal symptoms; and PTSD who was admitted to [facility] for an acute manic episode, with questionable psychotic features.   Diagnoses / Active Problems: Bipolar 1 disorder, in manic episode, with psychotic features Alcohol use disorder Posttraumatic stress disorder  PLAN: Safety and Monitoring:  --  INVOLUNTARY admission to inpatient psychiatric unit for safety, stabilization and treatment  -- Daily contact with patient to assess and evaluate symptoms and progress in treatment  -- Patient's case to be discussed in multi-disciplinary team meeting  -- Observation Level : q15 minute checks  -- Vital signs:  q12 hours  -- Precautions: suicide, elopement, and assault  2. Psychiatric Diagnoses and Treatment:  # Bipolar 1 disorder, and manic episode, with psychotic features # Alcohol use disorder # PTSD - Continue Zyprexa 15 mg nightly for mania. Patient continues to believe  she is "nudged" by God, which appears closer to fixed delusion. Affect improved. Patient shows greater insight (that her actions appear "strange" to others). Some question as to whether she will continue her medication at home. Family/friends will not be able to regularly check in on patient, which presents some roadblocks for outpatient. - Begin Depakote 250 BID for mania, ordered VPA level 12/2.  - Sodium correcting appropriately 125 --> 133, VitD, B12, B9 WNL. Awaiting Thiamine lab.  - Options for D/C: likely home, would prefer to have ACT team involved if possible, as friends/family will not be able to sufficiently help.  - The risks/benefits/side-effects/alternatives to this medication were discussed in detail with the patient and time was given for questions. The patient consents to medication trial.  - Metabolic profile and EKG monitoring obtained while on an atypical antipsychotic  BMI: 21.97 TSH: 1.128 Lipid panel: LDL 85 HbgA1c: 5.3 QTc: 415 - Encouraged patient to participate in unit milieu and in scheduled group therapies  - Short Term Goals: Ability to identify changes in lifestyle to reduce recurrence of condition will improve, Ability to verbalize feelings will improve, Ability to disclose and discuss suicidal ideas, Ability to maintain clinical measurements within normal limits will improve, and Compliance with prescribed medications will improve - Long Term Goals: Improvement in symptoms so as ready for discharge   Other PRNS: Agitation, constipation, sleep   Other labs reviewed on admission: As above   3. Medical Issues Being Addressed:   #Hyponatremia - Resolving appropriately with normal diet. Will d/c fluid restriction.  4. Discharge Planning:   -- Social work and case management to assist with discharge planning and identification of hospital follow-up needs prior to discharge  -- Estimated Discharge Date: 12/2-12/3   -- Discharge Concerns: Need to establish a safety  plan; Medication compliance and effectiveness  -- Discharge Goals: Return home with outpatient referrals for mental health follow-up including medication management/psychotherapy   I certify that inpatient services furnished can reasonably be expected to improve the patient's condition.    Luiz Iron, MD PGY-1 Psychiatry  11/27/202412:16 PM

## 2023-09-14 NOTE — Progress Notes (Signed)
   09/13/23 2130  Psych Admission Type (Psych Patients Only)  Admission Status Involuntary  Psychosocial Assessment  Patient Complaints Anxiety  Eye Contact Fair  Facial Expression Animated  Affect Anxious  Speech Tangential  Interaction Assertive  Motor Activity Restless  Appearance/Hygiene Unremarkable  Behavior Characteristics Cooperative  Mood Pleasant  Thought Process  Coherency Circumstantial  Content Preoccupation  Delusions None reported or observed  Perception WDL  Hallucination None reported or observed  Judgment Impaired  Confusion None  Danger to Self  Current suicidal ideation? Denies  Agreement Not to Harm Self Yes  Description of Agreement Verbal  Danger to Others  Danger to Others None reported or observed

## 2023-09-14 NOTE — Plan of Care (Signed)
Problem: Education: Goal: Emotional status will improve Outcome: Progressing Goal: Mental status will improve Outcome: Progressing   Problem: Activity: Goal: Interest or engagement in activities will improve Outcome: Progressing Goal: Sleeping patterns will improve Outcome: Progressing   Problem: Coping: Goal: Ability to verbalize frustrations and anger appropriately will improve Outcome: Progressing Goal: Ability to demonstrate self-control will improve Outcome: Progressing

## 2023-09-14 NOTE — Progress Notes (Addendum)
Pt denied SI/HI/AVH this morning. Pt encouraged to drink gatorade to replenish sodium levels. Pt has been pleasant, calm, and cooperative throughout the shift. RN provided support and encouragement to patient. Pt given scheduled medications as prescribed. Q15 min checks verified for safety. Patient verbally contracts for safety. Patient compliant with medications and treatment plan. Patient is interacting well on the unit. Pt is safe on the unit.   09/14/23 0800  Psych Admission Type (Psych Patients Only)  Admission Status Involuntary  Psychosocial Assessment  Patient Complaints None  Eye Contact Fair  Facial Expression Animated  Affect Anxious  Speech Tangential  Interaction Assertive  Motor Activity Restless  Appearance/Hygiene Unremarkable  Behavior Characteristics Cooperative;Anxious  Mood Pleasant  Thought Process  Coherency Circumstantial  Content Preoccupation  Delusions None reported or observed  Perception WDL  Hallucination None reported or observed  Judgment Impaired  Confusion None  Danger to Self  Current suicidal ideation? Denies  Agreement Not to Harm Self Yes  Description of Agreement verbal  Danger to Others  Danger to Others None reported or observed

## 2023-09-14 NOTE — BH IP Treatment Plan (Signed)
Interdisciplinary Treatment and Diagnostic Plan Update  09/14/2023 Time of Session: 11:30AM - UPDATE Michelle Levy MRN: [id number]  Principal Diagnosis: Bipolar affective disorder, current episode manic (HCC)  Secondary Diagnoses: Principal Problem:   Bipolar affective disorder, current episode manic (HCC)   Current Medications:  Current Facility-Administered Medications  Medication Dose Route Frequency Provider Last Rate Last Admin   acetaminophen (TYLENOL) tablet 650 mg  650 mg Oral Q6H PRN Rankin, Shuvon B, NP       alum & mag hydroxide-simeth (MAALOX/MYLANTA) 200-200-20 MG/5ML suspension 30 mL  30 mL Oral Q4H PRN Rankin, Shuvon B, NP       hydrOXYzine (ATARAX) tablet 25 mg  25 mg Oral TID PRN Rankin, Shuvon B, NP       magnesium hydroxide (MILK OF MAGNESIA) suspension 30 mL  30 mL Oral Daily PRN Rankin, Shuvon B, NP       multivitamin with minerals tablet 1 tablet  1 tablet Oral Daily Rankin, Shuvon B, NP   1 tablet at 09/14/23 0801   OLANZapine (ZYPREXA) tablet 15 mg  15 mg Oral Devoria Glassing, MD   15 mg at 09/13/23 2046   OLANZapine zydis (ZYPREXA) disintegrating tablet 5 mg  5 mg Oral TID PRN Rankin, Shuvon B, NP       traZODone (DESYREL) tablet 50 mg  50 mg Oral QHS PRN Rankin, Shuvon B, NP       vitamin D3 (CHOLECALCIFEROL) tablet 1,000 Units  1,000 Units Oral Daily Rankin, Shuvon B, NP   1,000 Units at 09/14/23 0801   PTA Medications: Medications Prior to Admission  Medication Sig Dispense Refill Last Dose   B Complex-C (B-COMPLEX WITH VITAMIN C) tablet Take 1 tablet by mouth daily.      calcium carbonate (OS-CAL - DOSED IN MG OF ELEMENTAL CALCIUM) 1250 (500 Ca) MG tablet Take 1 tablet by mouth daily with breakfast.      Collagen-Vitamin C-Biotin (COLLAGEN PO) Take 1 Units by mouth daily.      magnesium oxide (MAG-OX) 400 (240 Mg) MG tablet Take 400 mg by mouth daily.      omega-3 acid ethyl esters (LOVAZA) 1 g capsule Take 1 g by mouth daily.      cholecalciferol  (VITAMIN D) 1000 units tablet Take 1,000 Units by mouth daily.      Multiple Vitamin (MULTIVITAMIN WITH MINERALS) TABS tablet Take 1 tablet by mouth daily. 30 tablet 0     Patient Stressors: Other: manic symptoms    Patient Strengths: Active sense of humor  Capable of independent living  Communication skills  Financial means  General fund of knowledge  Supportive family/friends   Treatment Modalities: Medication Management, Group therapy, Case management,  1 to 1 session with clinician, Psychoeducation, Recreational therapy.   Physician Treatment Plan for Primary Diagnosis: Bipolar affective disorder, current episode manic (HCC) Long Term Goal(s):     Short Term Goals: Ability to identify changes in lifestyle to reduce recurrence of condition will improve Ability to verbalize feelings will improve Ability to disclose and discuss suicidal ideas Ability to maintain clinical measurements within normal limits will improve Compliance with prescribed medications will improve  Medication Management: Evaluate patient's response, side effects, and tolerance of medication regimen.  Therapeutic Interventions: 1 to 1 sessions, Unit Group sessions and Medication administration.  Evaluation of Outcomes: Progressing  Physician Treatment Plan for Secondary Diagnosis: Principal Problem:   Bipolar affective disorder, current episode manic (HCC)  Long Term Goal(s):     Short Term  Goals: Ability to identify changes in lifestyle to reduce recurrence of condition will improve Ability to verbalize feelings will improve Ability to disclose and discuss suicidal ideas Ability to maintain clinical measurements within normal limits will improve Compliance with prescribed medications will improve     Medication Management: Evaluate patient's response, side effects, and tolerance of medication regimen.  Therapeutic Interventions: 1 to 1 sessions, Unit Group sessions and Medication  administration.  Evaluation of Outcomes: Progressing   RN Treatment Plan for Primary Diagnosis: Bipolar affective disorder, current episode manic (HCC) Long Term Goal(s): Knowledge of disease and therapeutic regimen to maintain health will improve  Short Term Goals: Ability to remain free from injury will improve, Ability to verbalize frustration and anger appropriately will improve, Ability to participate in decision making will improve, Ability to verbalize feelings will improve, Ability to identify and develop effective coping behaviors will improve, and Compliance with prescribed medications will improve  Medication Management: RN will administer medications as ordered by provider, will assess and evaluate patient's response and provide education to patient for prescribed medication. RN will report any adverse and/or side effects to prescribing provider.  Therapeutic Interventions: 1 on 1 counseling sessions, Psychoeducation, Medication administration, Evaluate responses to treatment, Monitor vital signs and CBGs as ordered, Perform/monitor CIWA, COWS, AIMS and Fall Risk screenings as ordered, Perform wound care treatments as ordered.  Evaluation of Outcomes: Progressing   LCSW Treatment Plan for Primary Diagnosis: Bipolar affective disorder, current episode manic (HCC) Long Term Goal(s): Safe transition to appropriate next level of care at discharge, Engage patient in therapeutic group addressing interpersonal concerns.  Short Term Goals: Engage patient in aftercare planning with referrals and resources, Increase social support, Increase ability to appropriately verbalize feelings, Facilitate patient progression through stages of change regarding substance use diagnoses and concerns, Identify triggers associated with mental health/substance abuse issues, and Increase skills for wellness and recovery  Therapeutic Interventions: Assess for all discharge needs, 1 to 1 time with Social worker,  Explore available resources and support systems, Assess for adequacy in community support network, Educate family and significant other(s) on suicide prevention, Complete Psychosocial Assessment, Interpersonal group therapy.  Evaluation of Outcomes: Progressing   Progress in Treatment: Attending groups: Yes. Participating in groups: Yes. Taking medication as prescribed: Yes. Toleration medication: Yes. Family/Significant other contact made: Yes, contacted:  Toni Amend Rawley/friend [phone] Patient understands diagnosis: Yes. Discussing patient identified problems/goals with staff: Yes. Medical problems stabilized or resolved: Yes. Denies suicidal/homicidal ideation: Yes. Issues/concerns per patient self-inventory: No.   New problem(s) identified: No, Describe:  None reported   New Short Term/Long Term Goal(s): medication stabilization, elimination of SI thoughts, development of comprehensive mental wellness plan.     Patient Goals:  "Continue working on my wellness, make friends, introduce people to church"   Discharge Plan or Barriers: Patient recently admitted. CSW will continue to follow and assess for appropriate referrals and possible discharge planning.      Reason for Continuation of Hospitalization: Anxiety Medication stabilization   Estimated Length of Stay: 2-3 days  Last 3 [place] Suicide Severity Risk Score: Flowsheet Row [facility] (Current[facility] from 09/06/2023 in [facility]  C-SSRS RISK CATEGORY No Risk No Risk       Last PHQ 2/9 Scores:     No data to display          Scribe for Treatment Team: Kathi Der, LCSWA 09/14/2023 1:50 PM

## 2023-09-15 DIAGNOSIS — F3112 Bipolar disorder, current episode manic without psychotic features, moderate: Secondary | ICD-10-CM

## 2023-09-15 NOTE — Plan of Care (Signed)
  Problem: Education: Goal: Emotional status will improve Outcome: Progressing Goal: Mental status will improve Outcome: Progressing   Problem: Activity: Goal: Interest or engagement in activities will improve Outcome: Progressing Goal: Sleeping patterns will improve Outcome: Progressing

## 2023-09-15 NOTE — Progress Notes (Signed)
   09/15/23 0558  15 Minute Checks  Location Bedroom  Visual Appearance Calm  Behavior Composed  Sleep (Behavioral Health Patients Only)  Calculate sleep? (Click Yes once per 24 hr at 0600 safety check) Yes  Documented sleep last 24 hours 7

## 2023-09-15 NOTE — Group Note (Signed)
Date:  09/15/2023 Time:  3:29 PM  Group Topic/Focus:  Identifying Needs:   The focus of this group is to help patients identify their personal needs that have been historically problematic and identify healthy behaviors to address their needs.    Participation Level:  Minimal  Participation Quality:  Appropriate  Affect:  Appropriate  Cognitive:  Appropriate  Insight: Appropriate  Engagement in Group:  Limited  Modes of Intervention:  Discussion and Education  Additional Comments:    Arnoldo Hooker 09/15/2023, 3:29 PM

## 2023-09-15 NOTE — Progress Notes (Addendum)
Pt denied SI/HI/AVH this morning. Pt has been pleasant, calm, and cooperative throughout the shift. RN provided support and encouragement to patient. Pt given scheduled medications as prescribed. Q15 min checks verified for safety. Patient verbally contracts for safety. Patient compliant with medications and treatment plan. Patient is interacting well on the unit. Pt is safe on the unit.   09/15/23 1000  Psych Admission Type (Psych Patients Only)  Admission Status Involuntary  Psychosocial Assessment  Patient Complaints None  Eye Contact Fair  Facial Expression Anxious;Animated  Affect Anxious  Speech Press photographer  Appearance/Hygiene Unremarkable  Behavior Characteristics Cooperative;Anxious  Mood Pleasant  Thought Process  Coherency Circumstantial  Content Preoccupation  Delusions WDL  Perception WDL  Hallucination None reported or observed  Judgment Impaired  Confusion None  Danger to Self  Current suicidal ideation? Denies  Agreement Not to Harm Self Yes  Description of Agreement Verbal  Danger to Others  Danger to Others None reported or observed

## 2023-09-15 NOTE — Group Note (Signed)
[facility] LCSW Group Therapy Note  Date/Time: 09/15/2023 at 11:00AM - 12:00PM  Type of Therapy/Topic:  Group Therapy:  Journey and Not the Destination  Participation Level:  Active  Description of Group:   This group will address the importance of considering the journey and not just the destination. Patients will be encouraged to process areas in their lives where they found it hard to focus on the good rather than the bad, and identify reasons for maintaining such thought pattern. Facilitator will guide patients utilizing problem- solving interventions to address and improve the way each patient views themselves and their situation. Patients will work through understanding and applying humility when it comes to life changes and the interactions we have with others. Patients will be encouraged to explore ways that they will move forward throughout life's journey, and make healthier decisions for themselves.   Therapeutic Goals: Patient will identify two or more emotions or situations that they observed or felt throughout watching the video clip. Patient will identify signs where they may have responded to someone or situation due to their current circumstance.  Patient will identify two ways to set better habits in order to achieve more peace in their lives. Patient will demonstrate ability to communicate their needs through discussion and/or role plays.  Summary of Patient Progress: Patient actively participated in group on today. Patient reports feeling encouraged from watching the video, and she was able to provide feedback to staff and peers. Patient reports having a lot of takeaways from the video, however reports what she wants to keep in mind is to remain humble in every circumstance. No issues to report.   Therapeutic Modalities:   Cognitive Behavioral Therapy Solution-Focused Therapy Assertiveness Training  Fernande Boyden, LCSW Clinical Social Worker [facility]

## 2023-09-15 NOTE — Progress Notes (Signed)
[facility] MD Progress Note  09/15/2023 11:29 AM Michelle Levy  MRN:  [id number]  Principal Problem: Bipolar affective disorder, current episode manic (HCC) Diagnosis: Principal Problem:   Bipolar affective disorder, current episode manic (HCC)  Reason for Admission:  Michelle Levy is a 67 year old female with a past psychiatric history of bipolar 1 disorder, manic, requiring hospitalization; alcohol use disorder, without withdrawal symptoms; and PTSD who was admitted to [facility] for an acute manic episode, with questionable psychotic features.   Information discussed at bed report: Slept 7hours.  Denied SI, HI, AVH.  Vitals within normal limits.  PRNs needed: none.  Information Obtained Today During Patient Interview:  The patient was alert oriented and cooperative and remains somewhat depressed and frustrated about not being discharged but denies any major depressive symptoms.  She describes her mood as being good.  She reports that she did speak to her pastor and also had talked to her friend.  She denies psychosis but remains delusional.  She may be having some ongoing hallucinations because she does feel that whenever she gets a message from God he will either tap her on the back or the shoulder.  She is willing to take the medications as prescribed and despite her denial, she does tend to feel that she is around to help other people who are" brokenhearted" and need help.  She is contracting for safety.  She is willing to take the Depakote as prescribed.  On collateral call to daughter, [name] [phone]): Tried to call the daughter today but was unable to reach her.  She was contacted by Dr. Okey Dupre yesterday.  On collateral call with friend, Roney Marion [phone]): Toni Amend provided additional information.  Apparently the patient is still ambivalent about being on medications and may stop once she is discharged.  She still remains hyperreligious.  Prior to admission the patient  had an episode when she went to a group called" joy group" at church and after she had emotional outbursts she ran into the woods and buried herself under a pile of leaves claiming that God told her to do that.  Toni Amend also reported that when she came to visit the patient, she noticed that she was actually tapping on her left shoulder and talking to God saying" it is okay I will explain to her". The plan is to continue with the Zyprexa and titrate Depakote ER as tolerated.  Past Psychiatric History:   Current psychiatrist: none. Current therapist: Previously saw pastor at her church, counselor with ex-husband Previous psychiatric diagnoses: Bipolar disorder, mixed, with psychotic features Current psychiatric medications: denied. Psychiatric medication history/compliance:  Started on Lamictal/Seroquel in 2017, stopped approximately 1 year afterwards, did not resume. Psychiatric hospitalization(s):  2017, after a manic episode with psychosis similar to current presentation p. Psychotherapy history: none. Neuromodulation history: none. History of suicide (obtained from HPI): none. History of homicide or aggression (obtained in HPI): none.  Family Psychiatric History:   Mother: Nervous breakdown requiring hospitalization Sister: Bipolar disorder Sister: Depression Father: Alcoholism   Suicide history: none.  Violence/aggression: none. Substance use history:  EtOH-dad.  Social History:   Living situation:  Lives with self. 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 Medical diagnoses: None Medications: Patient takes no prescribed medications -- Allergies: none. Hospitalizations:  None. Surgeries:  C-section, D&C. Trauma: denied. Seizures: denied.  Family  History:  Family History  Problem Relation Age of Onset   Alcohol abuse Mother    Bipolar disorder Mother    Alcohol abuse Father    Depression Sister    Bipolar disorder Sister    Breast cancer Neg Hx     Current Medications: Current Facility-Administered Medications  Medication Dose Route Frequency Provider Last Rate Last Admin   acetaminophen (TYLENOL) tablet 650 mg  650 mg Oral Q6H PRN Rankin, Shuvon B, NP       alum & mag hydroxide-simeth (MAALOX/MYLANTA) 200-200-20 MG/5ML suspension 30 mL  30 mL Oral Q4H PRN Rankin, Shuvon B, NP       divalproex (DEPAKOTE ER) 24 hr tablet 250 mg  250 mg Oral BID Tomie China, MD   250 mg at 09/15/23 1308   hydrOXYzine (ATARAX) tablet 25 mg  25 mg Oral TID PRN Rankin, Shuvon B, NP       magnesium hydroxide (MILK OF MAGNESIA) suspension 30 mL  30 mL Oral Daily PRN Rankin, Shuvon B, NP       multivitamin with minerals tablet 1 tablet  1 tablet Oral Daily Rankin, Shuvon B, NP   1 tablet at 09/15/23 0814   OLANZapine (ZYPREXA) tablet 15 mg  15 mg Oral Devoria Glassing, MD   15 mg at 09/14/23 2011   OLANZapine zydis (ZYPREXA) disintegrating tablet 5 mg  5 mg Oral TID PRN Rankin, Shuvon B, NP       traZODone (DESYREL) tablet 50 mg  50 mg Oral QHS PRN Rankin, Shuvon B, NP       vitamin D3 (CHOLECALCIFEROL) tablet 1,000 Units  1,000 Units Oral Daily Rankin, Shuvon B, NP   1,000 Units at 09/15/23 [phone]    Lab Results:  Results for orders placed or performed during the hospital encounter of 09/07/23 (from the past 48 hour(s))  Comprehensive metabolic panel     Status: Abnormal   Collection Time: 09/13/23  6:34 PM  Result Value Ref Range   Sodium 133 (L) 135 - 145 mmol/L   Potassium 4.4 3.5 - 5.1 mmol/L   Chloride 96 (L) 98 - 111 mmol/L   CO2 26 22 - 32 mmol/L   Glucose, Bld 177 (H) 70 - 99 mg/dL    Comment: Glucose reference range applies only to samples taken after fasting for at least 8 hours.   BUN 15 8 - 23 mg/dL   Creatinine, Ser 4.69  0.44 - 1.00 mg/dL   Calcium 8.9 8.9 - 62.9 mg/dL   Total Protein 7.0 6.5 - 8.1 g/dL   Albumin 4.0 3.5 - 5.0 g/dL   AST 22 15 - 41 U/L   ALT 20 0 - 44 U/L   Alkaline Phosphatase 66 38 - 126 U/L   Total Bilirubin 0.6 <1.2 mg/dL   GFR, Estimated >52 >84 mL/min    Comment: (NOTE) Calculated using the CKD-EPI Creatinine Equation (2021)    Anion gap 11 5 - 15    Comment: Performed at [facility], [address] [place], [place] [address]  Vitamin B12     Status: None   Collection Time: 09/13/23  6:34 PM  Result Value Ref Range   Vitamin B-12 774 180 - 914 pg/mL    Comment: (NOTE) This assay is not validated for testing neonatal or myeloproliferative syndrome specimens for Vitamin B12 levels. Performed at [facility], [address], [place], [place] [address]  Folate     Status: None   Collection Time: 09/13/23  6:34 PM  Result Value Ref Range   Folate 21.3 >5.9 ng/mL    Comment: RESULT CONFIRMED BY MANUAL DILUTION Performed at [facility], [address] [place], [place] [address]   VITAMIN D 25 Hydroxy (Vit-D Deficiency, Fractures)     Status: None   Collection Time: 09/13/23  6:34 PM  Result Value Ref Range   Vit D, 25-Hydroxy 56.11 30 - 100 ng/mL    Comment: (NOTE) Vitamin D deficiency has been defined by the Institute of Medicine  and an Endocrine Society practice guideline as a level of serum 25-OH  vitamin D less than 20 ng/mL (1,2). The Endocrine Society went on to  further define vitamin D insufficiency as a level between 21 and 29  ng/mL (2).  1. IOM (Institute of Medicine). 2010. Dietary reference intakes for  calcium and D. [place]: [employer]. 2. Holick MF, Binkley [place], Bischoff-Ferrari HA, et al. Evaluation,  treatment, and prevention of vitamin D deficiency: an Endocrine  Society clinical practice guideline, JCEM. 2011 Jul; 96(7): 1911-30.  Performed at [facility], [address], [place], [place] [address]     Blood Alcohol level:  Lab Results  Component Value Date   ETH <10 09/06/2023   ETH 159 (H) 11/20/2015    Metabolic Labs: Lab Results  Component Value Date   HGBA1C 5.3 09/06/2023   MPG 105.41 09/06/2023   MPG 108 11/22/2015   Lab Results  Component Value Date   PROLACTIN 14.9 11/22/2015   Lab Results  Component Value Date   CHOL 198 09/06/2023   TRIG 29 09/06/2023   HDL 107 09/06/2023   CHOLHDL 1.9 09/06/2023   VLDL 6 09/06/2023   LDLCALC 85 09/06/2023   LDLCALC 101 (H) 11/22/2015    Physical Findings: AIMS: No  Psychiatric Specialty Exam:  Presentation  General Appearance: Casual  Eye Contact:Fair  Speech:Clear and Coherent  Speech Volume:Normal  Handedness:Right   Mood and Affect  Mood:Anxious; Labile  Affect:Full Range   Thought Process  Thought Processes:Coherent  Descriptions of Associations:Tangential  Orientation:Full (Time, Place and Person)  Thought Content:Rumination  History of Schizophrenia/Schizoaffective disorder:No   Hallucinations:Hallucinations: None   Ideas of Reference:None  Suicidal Thoughts:Suicidal Thoughts: No  Homicidal Thoughts:Homicidal Thoughts: No   Sensorium  Memory:Immediate Fair; Remote Fair; Recent Fair  Judgment:Fair  Insight:Fair   Executive Functions  Concentration:Fair  Attention Span:Fair  Recall:Fair  Fund of Knowledge:Fair  Language:Fair   Psychomotor Activity  Psychomotor Activity: Normal   Agricultural engineer; Desire for Improvement   Sleep  Sleep: Fair Number of Hours of Sleep: 7.25  Physical Exam Constitutional:      General: She is not in acute distress.    Appearance: Normal appearance. She is normal weight. She is not ill-appearing.  HENT:     Head: Atraumatic.  Eyes:     Extraocular Movements: Extraocular movements intact.  Pulmonary:     Effort: Pulmonary effort is normal. No respiratory  distress.  Musculoskeletal:        General: No deformity. Normal range of motion.  Neurological:     General: No focal deficit present.     Mental Status: Mental status is at baseline.    Review of Systems  Psychiatric/Behavioral:  Positive for hallucinations. The patient is nervous/anxious.   All other systems reviewed and are negative.  Blood pressure 106/72, pulse 83, temperature 98.1 F (36.7  C), temperature source Oral, resp. rate 17, height 5\' 3"  (1.6 m), weight 56.2 kg, SpO2 99%. Body mass index is 21.97 kg/m.  Treatment Plan Summary: Daily contact with patient to assess and evaluate symptoms and progress in treatment and Medication management   ASSESSMENT:  Michelle Levy is a 67 year old female with a past psychiatric history of bipolar 1 disorder, manic, requiring hospitalization; alcohol use disorder, without withdrawal symptoms; and PTSD who was admitted to [facility] for an acute manic episode, with questionable psychotic features.   Diagnoses / Active Problems: Bipolar 1 disorder, in manic episode, with psychotic features Alcohol use disorder Posttraumatic stress disorder  PLAN: Safety and Monitoring:  --  INVOLUNTARY admission to inpatient psychiatric unit for safety, stabilization and treatment  -- Daily contact with patient to assess and evaluate symptoms and progress in treatment  -- Patient's case to be discussed in multi-disciplinary team meeting  -- Observation Level : q15 minute checks  -- Vital signs:  q12 hours  -- Precautions: suicide, elopement, and assault  2. Psychiatric Diagnoses and Treatment:  # Bipolar 1 disorder, and manic episode, with psychotic features # Alcohol use disorder # PTSD - Continue Zyprexa 15 mg nightly for mania. Patient continues to believe she is "nudged" by God, which appears closer to fixed delusion. Affect improved. Patient shows greater insight (that her actions appear "strange" to others). Some question as to whether she will  continue her medication at home. Family/friends will not be able to regularly check in on patient, which presents some roadblocks for outpatient. - Begin Depakote 250 BID for mania, ordered VPA level 12/2.  - Sodium correcting appropriately 125 --> 133, VitD, B12, B9 WNL. Awaiting Thiamine lab.  - Options for D/C: likely home, would prefer to have ACT team involved if possible, as friends/family will not be able to sufficiently help.  - The risks/benefits/side-effects/alternatives to this medication were discussed in detail with the patient and time was given for questions. The patient consents to medication trial.  - Metabolic profile and EKG monitoring obtained while on an atypical antipsychotic  BMI: 21.97 TSH: 1.128 Lipid panel: LDL 85 HbgA1c: 5.3 QTc: 415 - Encouraged patient to participate in unit milieu and in scheduled group therapies  - Short Term Goals: Ability to identify changes in lifestyle to reduce recurrence of condition will improve, Ability to verbalize feelings will improve, Ability to disclose and discuss suicidal ideas, Ability to maintain clinical measurements within normal limits will improve, and Compliance with prescribed medications will improve - Long Term Goals: Improvement in symptoms so as ready for discharge   Other PRNS: Agitation, constipation, sleep   Other labs reviewed on admission: As above   3. Medical Issues Being Addressed:   #Hyponatremia - Resolving appropriately with normal diet. Will d/c fluid restriction.  4. Discharge Planning:   -- Social work and case management to assist with discharge planning and identification of hospital follow-up needs prior to discharge  -- Estimated Discharge Date: 12/2-12/3   -- Discharge Concerns: Need to establish a safety plan; Medication compliance and effectiveness  -- Discharge Goals: Return home with outpatient referrals for mental health follow-up including medication management/psychotherapy   I certify  that inpatient services furnished can reasonably be expected to improve the patient's condition.    Luiz Iron, MD PGY-1 Psychiatry  11/28/202411:29 AM Patient ID: Geri Seminole, female   DOB: 1956-07-14, 67 y.o.   MRN: [id number]

## 2023-09-15 NOTE — Progress Notes (Signed)
   09/15/23 0027  Psych Admission Type (Psych Patients Only)  Admission Status Involuntary  Psychosocial Assessment  Patient Complaints Sleep disturbance  Eye Contact Fair  Facial Expression Animated;Anxious  Affect Anxious  Speech Logical/coherent  Interaction Assertive  Motor Activity Restless  Appearance/Hygiene Unremarkable  Behavior Characteristics Cooperative  Mood Pleasant;Anxious  Thought Process  Coherency Circumstantial  Content Preoccupation  Delusions WDL  Perception WDL  Hallucination None reported or observed  Judgment Impaired  Confusion WDL  Danger to Self  Current suicidal ideation? Denies  Danger to Others  Danger to Others None reported or observed   Upon initial assessment pt was laying in bed resting, received zyprexa with no issues, states she has been anxious today, denies SI/HI or hallucinations (a) 15 min checks (r) safety maintained.

## 2023-09-16 DIAGNOSIS — F3113 Bipolar disorder, current episode manic without psychotic features, severe: Secondary | ICD-10-CM | POA: Diagnosis not present

## 2023-09-16 NOTE — Group Note (Signed)
Date:  09/16/2023 Time:  5:08 PM  Group Topic/Focus:  Healthy Communication:   The focus of this group is to discuss communication, barriers to communication, as well as healthy ways to communicate with others.    Participation Level:  Active  Participation Quality:  Appropriate  Affect:  Appropriate  Cognitive:  Appropriate  Insight: Appropriate  Engagement in Group:  Engaged  Modes of Intervention:  Education  Additional Comments:     Reymundo Poll 09/16/2023, 5:08 PM

## 2023-09-16 NOTE — Progress Notes (Signed)
   09/16/23 0906  Psych Admission Type (Psych Patients Only)  Admission Status Involuntary  Psychosocial Assessment  Patient Complaints None  Eye Contact Fair  Facial Expression Anxious;Animated  Affect Depressed;Anxious  Speech Logical/coherent  Interaction Assertive  Motor Activity Fidgety  Appearance/Hygiene Unremarkable  Behavior Characteristics Cooperative;Appropriate to situation  Mood Anxious;Depressed  Thought Process  Coherency WDL  Content Preoccupation  Delusions WDL  Perception WDL  Hallucination None reported or observed  Judgment Impaired  Confusion None  Danger to Self  Current suicidal ideation? Denies  Agreement Not to Harm Self Yes  Description of Agreement Veral  Danger to Others  Danger to Others None reported or observed

## 2023-09-16 NOTE — Progress Notes (Signed)
[facility] MD Progress Note  09/16/2023 2:26 PM Michelle Levy  MRN:  [id number]  Principal Problem: Bipolar affective disorder, current episode manic (HCC) Diagnosis: Principal Problem:   Bipolar affective disorder, current episode manic (HCC)  Patient is a  67y.o. female who presents to the [facility] unit due to mania in the context of non-compliance with medication.    Interval History Patient was seen today for re-evaluation.  Nursing reports no events overnight. The patient has no issues with performing ADLs.  Patient has been medication compliant.    Patient was seen and interviewed by attending psychiatrist. Chart reviewed. Patient discussed during treatment team rounds.  Subjective:  On assessment patient reports feeling sad due to being still in the hospital for the ninth day. She denies feeling depressed, she is upset due to being here and anxious that she will still be here for long time. She reports good sleep and appetite. She denies auditory or visual hallucinations, denies feeling paranoid, unsafe, does not express any delusions. He denies thoughts or plans of hurting self or others. He reports no side effects from medications she is getting here; she had several questions about Zyprexa`s possible side effects and its difference from her past medication, Seroquel. We also discussed Depakote and the importance to check VPA level. She denies any physical complaints. She is agreeable to [facility] and hopes the SW can send referrals today.  Labs: no new results for review.     Total Time spent with patient: 30 minutes  Past Psychiatric History: see H&P  Past Medical History: History reviewed. No pertinent past medical history.  Past Surgical History:  Procedure Laterality Date   CESAREAN SECTION     Family History:  Family History  Problem Relation Age of Onset   Alcohol abuse Mother    Bipolar disorder Mother    Alcohol abuse Father    Depression Sister    Bipolar disorder Sister     Breast cancer Neg Hx    Family Psychiatric  History: see H&P  Social History:  Social History   Substance and Sexual Activity  Alcohol Use No   Alcohol/week: 0.0 standard drinks of alcohol     Social History   Substance and Sexual Activity  Drug Use No    Social History   Socioeconomic History   Marital status: Single    Spouse name: Not on file   Number of children: Not on file   Years of education: Not on file   Highest education level: Not on file  Occupational History   Not on file  Tobacco Use   Smoking status: Never   Smokeless tobacco: Never  Substance and Sexual Activity   Alcohol use: No    Alcohol/week: 0.0 standard drinks of alcohol   Drug use: No   Sexual activity: Never    Birth control/protection: Post-menopausal  Other Topics Concern   Not on file  Social History Narrative   Not on file   Social Determinants of Health   Financial Resource Strain: Not on file  Food Insecurity: No Food Insecurity (09/07/2023)   Hunger Vital Sign    Worried About Running Out of Food in the Last Year: Never true    Ran Out of Food in the Last Year: Never true  Transportation Needs: No Transportation Needs (09/07/2023)   PRAPARE - Administrator, Civil Service (Medical): No    Lack of Transportation (Non-Medical): No  Physical Activity: Not on file  Stress: Not on file  Social Connections: Not on file   Additional Social History:                         Sleep: Fair  Appetite:  Fair  Current Medications: Current Facility-Administered Medications  Medication Dose Route Frequency Provider Last Rate Last Admin   acetaminophen (TYLENOL) tablet 650 mg  650 mg Oral Q6H PRN Rankin, Shuvon B, NP       alum & mag hydroxide-simeth (MAALOX/MYLANTA) 200-200-20 MG/5ML suspension 30 mL  30 mL Oral Q4H PRN Rankin, Shuvon B, NP       divalproex (DEPAKOTE ER) 24 hr tablet 250 mg  250 mg Oral BID Tomie China, MD   250 mg at 09/16/23 1610    hydrOXYzine (ATARAX) tablet 25 mg  25 mg Oral TID PRN Rankin, Shuvon B, NP       magnesium hydroxide (MILK OF MAGNESIA) suspension 30 mL  30 mL Oral Daily PRN Rankin, Shuvon B, NP       multivitamin with minerals tablet 1 tablet  1 tablet Oral Daily Rankin, Shuvon B, NP   1 tablet at 09/16/23 0739   OLANZapine (ZYPREXA) tablet 15 mg  15 mg Oral Devoria Glassing, MD   15 mg at 09/15/23 2053   OLANZapine zydis (ZYPREXA) disintegrating tablet 5 mg  5 mg Oral TID PRN Rankin, Shuvon B, NP       traZODone (DESYREL) tablet 50 mg  50 mg Oral QHS PRN Rankin, Shuvon B, NP       vitamin D3 (CHOLECALCIFEROL) tablet 1,000 Units  1,000 Units Oral Daily Rankin, Shuvon B, NP   1,000 Units at 09/16/23 0739    Lab Results: No results found for this or any previous visit (from the past 48 hour(s)).  Blood Alcohol level:  Lab Results  Component Value Date   ETH <10 09/06/2023   ETH 159 (H) 11/20/2015    Metabolic Disorder Labs: Lab Results  Component Value Date   HGBA1C 5.3 09/06/2023   MPG 105.41 09/06/2023   MPG 108 11/22/2015   Lab Results  Component Value Date   PROLACTIN 14.9 11/22/2015   Lab Results  Component Value Date   CHOL 198 09/06/2023   TRIG 29 09/06/2023   HDL 107 09/06/2023   CHOLHDL 1.9 09/06/2023   VLDL 6 09/06/2023   LDLCALC 85 09/06/2023   LDLCALC 101 (H) 11/22/2015    Physical Findings: AIMS:  , ,  ,  ,    CIWA:    COWS:     Musculoskeletal: Strength & Muscle Tone: within normal limits Gait & Station: normal Patient leans: N/A  Psychiatric Specialty Exam:  Appearance:  CF, appearing stated age,  wearing appropriate to the situation casual/hospital clothes, with fair grooming and hygiene. Normal level of alertness and appropriate facial expression.  Attitude/Behavior: calm, cooperative, engaging with appropriate eye contact.  Motor: WNL; dyskinesias not evident. Gait appears in full range.  Speech: spontaneous, clear, coherent, normal  comprehension.  Mood: euthymic, "I am okay ".  Affect: appropriately-reactive, restricted.  Thought process: patient appears coherent, organized, logical, goal-directed, associations are appropriate.  Thought content: patient denies suicidal thoughts, denies homicidal thoughts; did not express any obvious delusions.  Thought perception: patient denies auditory and visual hallucinations. Did not appear internally stimulated.  Cognition: patient is alert and oriented in self, place, date; with intact attention and concentration.  Insight: fair, in regards of understanding of presence, nature, cause, and significance of mental or emotional problem.  Judgement: fair, in regards of ability to make good decisions concerning the appropriate thing to do in various situations, including ability to form opinions regarding their mental health condition.   Physical Exam: Physical Exam ROS Blood pressure 123/75, pulse 78, temperature 97.8 F (36.6 C), temperature source Oral, resp. rate 16, height 5\' 3"  (1.6 m), weight 56.2 kg, SpO2 100%. Body mass index is 21.97 kg/m.   Treatment Plan Summary: Daily contact with patient to assess and evaluate symptoms and progress in treatment and Medication management  Patient is a 67 year old female with the above-stated past psychiatric history who is seen in follow-up.  Chart reviewed. Patient discussed with nursing.  Diagnoses/ Active problems: Bipolar 1 disorder, in manic episode, with psychotic features Alcohol use disorder Posttraumatic stress disorder   PLAN:  Safety and Monitoring: continue INVOLUNTARY inpatient psych admission; 15-minute checks; daily contact with patient to assess and evaluate symptoms and progress in treatment; psychoeducation.Vital signs: q12 hours. Precautions: suicide, elopement, and assault. Placed on room lock out for meals, snacks and groups.  Psychiatric Problems: - Continue Zyprexa 15 mg nightly for mania.  -  continue Depakote 250 BID for mania, ordered VPA level 12/2.   - Metabolic profile and EKG monitoring obtained while on an atypical antipsychotic.  - Encouraged patient to participate in unit milieu and in scheduled group therapies   Medical Problems: Hyponatremia - Resolving appropriately with normal diet  PRN medications:  acetaminophen, alum & mag hydroxide-simeth, hydrOXYzine, magnesium hydroxide, OLANZapine zydis, traZODone    Discharge Planning: -Social work and case management to assist with discharge planning and identification of hospital follow-up needs prior to discharge -Estimated LOS: 12/2-12/3 -Discharge Concerns: Need to establish a safety plan; Medication compliance and effectiveness -Discharge Goals: Return home with outpatient referrals for mental health follow-up including medication management/psychotherapy. Referral to PHP.  Total Time Spent in Direct Patient Care:  I personally spent 35 minutes on the unit in direct patient care. The direct patient care time included face-to-face time with the patient, reviewing the patient's chart, communicating with other professionals, and coordinating care. Greater than 50% of this time was spent in counseling or coordinating care with the patient regarding goals of hospitalization, psycho-education, and discharge planning needs.   Thalia Party, MD 09/16/2023, 2:26 PM

## 2023-09-16 NOTE — Group Note (Signed)
Date:  09/16/2023 Time:  10:15 AM  Group Topic/Focus:  Orientation:   The focus of this group is to educate the patient on the purpose and policies of crisis stabilization and provide a format to answer questions about their admission.  The group details unit policies and expectations of patients while admitted.    Participation Level:  Active  Participation Quality:  Attentive  Affect:  Appropriate  Cognitive:  Appropriate  Insight: Appropriate  Engagement in Group:  Engaged  Modes of Intervention:  Discussion  Additional Comments:     Reymundo Poll 09/16/2023, 10:15 AM

## 2023-09-16 NOTE — Group Note (Signed)
Date:  09/16/2023 Time:  10:58 PM  Group Topic/Focus:  Wrap-Up Group:   The focus of this group is to help patients review their daily goal of treatment and discuss progress on daily workbooks.    Participation Level:  Active  Participation Quality:  Appropriate  Affect:  Appropriate  Cognitive:  Appropriate  Insight: Appropriate  Engagement in Group:  Engaged  Modes of Intervention:  Socialization  Additional Comments:  Pt attended the evening wrap-up group. Tech introduced the staff for the evening, reminded group of the evening schedule and reminded them to ask for anything they need. PT worked on socialization increased knowledge of coping skills through an interactive activity.  Osa Craver 09/16/2023, 10:58 PM

## 2023-09-16 NOTE — Progress Notes (Signed)
   09/15/23 2022  Psych Admission Type (Psych Patients Only)  Admission Status Involuntary  Psychosocial Assessment  Patient Complaints None  Eye Contact Fair  Facial Expression Anxious;Animated  Affect Anxious  Speech Logical/coherent  Interaction Assertive  Motor Activity Restless  Appearance/Hygiene Unremarkable  Behavior Characteristics Cooperative;Appropriate to situation  Mood Pleasant  Thought Process  Coherency WDL  Content Preoccupation  Delusions WDL  Perception WDL  Hallucination None reported or observed  Judgment Limited  Confusion None  Danger to Self  Current suicidal ideation? Denies  Agreement Not to Harm Self Yes  Description of Agreement verbal  Danger to Others  Danger to Others None reported or observed

## 2023-09-16 NOTE — [facility] Group Notes (Signed)
[facility] Group Notes:  (Nursing/MHT/Case Management/Adjunct)  Date:  09/16/2023  Time:  1:47 AM  Type of Therapy:   Wrap-up group  Participation Level:  Active  Participation Quality:  Appropriate  Affect:  Appropriate  Cognitive:  Appropriate  Insight:  Appropriate  Engagement in Group:  Engaged  Modes of Intervention:  Education  Summary of Progress/Problems: Goal Read a book. Rated day 9/10.  Michelle Levy 09/16/2023, 1:47 AM

## 2023-09-16 NOTE — Group Note (Signed)
Recreation Therapy Group Note   Group Topic:Problem Solving  Group Date: 09/16/2023 Start Time: 0930 End Time: 1000 Facilitators: Lavella Myren-McCall, LRT,CTRS Location: [address] Dayroom   Group Topic: Problem Solving  Goal Area(s) Addresses:  Patient will effectively work in a team with other group members. Patient will verbalize importance of using appropriate problem solving techniques.  Patient will identify positive change associated with effective problem solving skills.   Intervention: Worksheets, Pencils  Group Description: Dentist. Patients were given two sheets of brain teasers. Patients were given 20 minutes to try and figure out as many of teasers they could. Patients were also allowed to work together if they chose to. Once patients finished, LRT would go over the answers with the patients.    Education Outcome: Acknowledges understanding/In group clarification offered/Needs additional education.    Clinical Observations/Individualized Feedback: Due to previous group going over/exceeding time, recreation therapy group was unable to be held at scheduled time.     Plan: Continue to engage patient in RT group sessions 2-3x/week.   Michelle Levy, LRT,CTRS 09/16/2023 1:24 PM

## 2023-09-16 NOTE — Plan of Care (Signed)
°  Problem: Education: °Goal: Emotional status will improve °Outcome: Progressing °Goal: Mental status will improve °Outcome: Progressing °  °Problem: Activity: °Goal: Interest or engagement in activities will improve °Outcome: Progressing °  °

## 2023-09-16 NOTE — Group Note (Signed)
Date:  09/16/2023 Time:  1:46 PM  Group Topic/Focus:  Goals Group:   The focus of this group is to help patients establish daily goals to achieve during treatment and discuss how the patient can incorporate goal setting into their daily lives to aide in recovery.    Participation Level:  Active  Participation Quality:  Appropriate  Affect:  Appropriate  Cognitive:  Appropriate  Insight: Appropriate  Engagement in Group:  Engaged  Modes of Intervention:  Discussion  Additional Comments:     Reymundo Poll 09/16/2023, 1:46 PM

## 2023-09-17 DIAGNOSIS — F3113 Bipolar disorder, current episode manic without psychotic features, severe: Secondary | ICD-10-CM

## 2023-09-17 LAB — VALPROIC ACID LEVEL: Valproic Acid Lvl: 37 ug/mL — ABNORMAL LOW (ref 50.0–100.0)

## 2023-09-17 NOTE — Group Note (Unsigned)
Date:  09/18/2023 Time:  12:19 AM  Group Topic/Focus:  Wrap-Up Group:   The focus of this group is to help patients review their daily goal of treatment and discuss progress on daily workbooks.    Participation Level:  Active  Participation Quality:  Appropriate and Sharing  Affect:  Appropriate  Cognitive:  Appropriate  Insight: Appropriate and Limited  Engagement in Group:  Engaged  Modes of Intervention:  Activity and Socialization  Additional Comments:  Patient stated that he is doing "good" and stated that he had a "good day"/ Patient stated that he interacted with others on the unit throughout the day. Patient was limited and did not share much. Patient rated his day a 8/10. Patient participated in activity after sharing.   Kennieth Francois 09/18/2023, 12:19 AM

## 2023-09-17 NOTE — Plan of Care (Signed)
  Problem: Education: Goal: Emotional status will improve Outcome: Progressing Goal: Mental status will improve Outcome: Progressing   

## 2023-09-17 NOTE — Progress Notes (Signed)
[facility] MD Progress Note  09/17/2023 8:07 AM KAWEHI ROGAN  MRN:  [id number]  Principal Problem: Bipolar affective disorder, current episode manic (HCC) Diagnosis: Principal Problem:   Bipolar affective disorder, current episode manic (HCC)  Reason for Admission:  Michelle Levy is a 67 year old female with a past psychiatric history of bipolar 1 disorder, manic, requiring hospitalization; alcohol use disorder, without withdrawal symptoms; and PTSD who was admitted to [facility] for an acute manic episode, with questionable psychotic features.   Information discussed at bed report: Slept 8.0 hours.  Denies SI, HI, AVH.  Continues to appear confused -- she will ask her medications after taking them.  Repeats questions over the course of days.  MoCA conducted last week: 25/30, however patient will likely need further neurological workup outpatient  PRNs needed: none.  Information Obtained Today During Patient Interview:  Patient appears at baseline today -- mood is "good, considering the situation."  Patient affect is not congruent, appears down.  Sleeping well.  Eating very well.  Denied suicidal ideation, homicidal ideation, auditory and visual hallucinations.  Denied any medication side effects.  Asked if her Zyprexa is something she could take, when she felt she needed it."  Strongly reinforced that Zyprexa is a medication that need to be taken every day, and that she should take medications as prescribed.  Amenable to PHP.  Still very much focused on discharge.  Past Psychiatric History:   Current psychiatrist: none. Current therapist: Previously saw pastor at her church, counselor with ex-husband Previous psychiatric diagnoses: Bipolar disorder, mixed, with psychotic features Current psychiatric medications: denied. Psychiatric medication history/compliance:  Started on Lamictal/Seroquel in 2017, stopped approximately 1 year afterwards, did not resume. Psychiatric hospitalization(s):  2017, after a  manic episode with psychosis similar to current presentation p. Psychotherapy history: none. Neuromodulation history: none. History of suicide (obtained from HPI): none. History of homicide or aggression (obtained in HPI): none.  Family Psychiatric History:   Mother: Nervous breakdown requiring hospitalization Sister: Bipolar disorder Sister: Depression Father: Alcoholism   Suicide history: none.  Violence/aggression: none. Substance use history:  EtOH-dad.  Social History:   Living situation:  Lives with self. 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 Medical diagnoses: None Medications: Patient takes no prescribed medications  Allergies: none. Hospitalizations:  None. Surgeries:  C-section, D&C. Trauma: denied. Seizures: denied.  Family History:  Family History  Problem Relation Age of Onset   Alcohol abuse Mother    Bipolar disorder Mother    Alcohol abuse Father    Depression Sister    Bipolar disorder Sister    Breast cancer Neg Hx     Current Medications: Current Facility-Administered Medications  Medication Dose Route Frequency Provider Last Rate Last Admin   acetaminophen (TYLENOL) tablet 650 mg  650 mg Oral Q6H PRN Rankin, Shuvon B, NP       alum & mag hydroxide-simeth (MAALOX/MYLANTA) 200-200-20 MG/5ML suspension 30 mL  30 mL Oral Q4H PRN Rankin, Shuvon B, NP       divalproex (DEPAKOTE ER) 24 hr tablet 250 mg  250 mg Oral BID Tomie China, MD   250 mg at 09/17/23 0750   hydrOXYzine (ATARAX) tablet 25 mg  25 mg Oral TID PRN Rankin, Shuvon B, NP       magnesium hydroxide (MILK OF  MAGNESIA) suspension 30 mL  30 mL Oral Daily PRN Rankin, Shuvon B, NP       multivitamin with minerals tablet 1 tablet  1 tablet Oral Daily Rankin,  Shuvon B, NP   1 tablet at 09/17/23 0751   OLANZapine (ZYPREXA) tablet 15 mg  15 mg Oral Devoria Glassing, MD   15 mg at 09/16/23 2100   OLANZapine zydis (ZYPREXA) disintegrating tablet 5 mg  5 mg Oral TID PRN Rankin, Shuvon B, NP       traZODone (DESYREL) tablet 50 mg  50 mg Oral QHS PRN Rankin, Shuvon B, NP       vitamin D3 (CHOLECALCIFEROL) tablet 1,000 Units  1,000 Units Oral Daily Rankin, Shuvon B, NP   1,000 Units at 09/17/23 0751    Lab Results:  No results found for this or any previous visit (from the past 48 hour(s)).   Blood Alcohol level:  Lab Results  Component Value Date   ETH <10 09/06/2023   ETH 159 (H) 11/20/2015    Metabolic Labs: Lab Results  Component Value Date   HGBA1C 5.3 09/06/2023   MPG 105.41 09/06/2023   MPG 108 11/22/2015   Lab Results  Component Value Date   PROLACTIN 14.9 11/22/2015   Lab Results  Component Value Date   CHOL 198 09/06/2023   TRIG 29 09/06/2023   HDL 107 09/06/2023   CHOLHDL 1.9 09/06/2023   VLDL 6 09/06/2023   LDLCALC 85 09/06/2023   LDLCALC 101 (H) 11/22/2015    Physical Findings: AIMS: No  Psychiatric Specialty Exam:  Presentation  General Appearance: Casual  Eye Contact:Fair  Speech:Clear and Coherent  Speech Volume:Normal  Handedness:Right   Mood and Affect  Mood:Anxious; Labile  Affect:Full Range   Thought Process  Thought Processes:Coherent  Descriptions of Associations:Tangential  Orientation:Full (Time, Place and Person)  Thought Content:Rumination  History of Schizophrenia/Schizoaffective disorder:No   Hallucinations:Hallucinations: None   Ideas of Reference:None  Suicidal Thoughts:No data recorded  Homicidal Thoughts:No data recorded   Sensorium  Memory:Immediate Fair; Remote Fair; Recent Fair  Judgment:Fair  Insight:Fair   Executive Functions  Concentration:Fair  Attention Span:Fair  Recall:Fair  Fund of Knowledge:Fair  Language:Fair   Psychomotor  Activity  No data recorded   Agricultural engineer; Desire for Improvement   Sleep  No data recorded  Physical Exam Constitutional:      General: She is not in acute distress.    Appearance: Normal appearance. She is normal weight. She is not ill-appearing.  HENT:     Head: Atraumatic.  Eyes:     Extraocular Movements: Extraocular movements intact.  Pulmonary:     Effort: Pulmonary effort is normal. No respiratory distress.  Musculoskeletal:        General: No deformity. Normal range of motion.  Neurological:     General: No focal deficit present.     Mental Status: Mental status is at baseline.    Review of Systems  Psychiatric/Behavioral:  Positive for hallucinations. The patient is nervous/anxious.   All other systems reviewed and are negative.  Blood pressure 114/79, pulse 92, temperature 98.1 F (36.7 C), temperature source Oral, resp. rate 16, height 5\' 3"  (1.6 m), weight 56.2 kg, SpO2 100%. Body mass index is 21.97 kg/m.  Treatment Plan Summary: Daily contact with patient to assess and evaluate symptoms and progress in treatment and Medication management   ASSESSMENT:  Michelle Levy is a 67 year old female with a past psychiatric history of bipolar 1 disorder, manic, requiring hospitalization;  alcohol use disorder, without withdrawal symptoms; and PTSD who was admitted to [facility] for an acute manic episode, with questionable psychotic features.   Diagnoses / Active Problems: Bipolar 1 disorder, in manic episode, with psychotic features Alcohol use disorder Posttraumatic stress disorder  PLAN: Safety and Monitoring:  --  INVOLUNTARY admission to inpatient psychiatric unit for safety, stabilization and treatment  -- Daily contact with patient to assess and evaluate symptoms and progress in treatment  -- Patient's case to be discussed in multi-disciplinary team meeting  -- Observation Level : q15 minute checks  -- Vital signs:  q12 hours  -- Precautions:  suicide, elopement, and assault  2. Psychiatric Diagnoses and Treatment:  # Bipolar 1 disorder, and manic episode, with psychotic features # Alcohol use disorder # PTSD - Continue Zyprexa 15 mg nightly for mania.  SW to send PHP paperwork when able. - Continue Depakote 250 BID for mania, ordered VPA level 12/2.  - Awaiting Thiamine lab.  - Options for D/C: likely home, would prefer to have PHP set up if possible before discharge. - The risks/benefits/side-effects/alternatives to this medication were discussed in detail with the patient and time was given for questions. The patient consents to medication trial.  - Metabolic profile and EKG monitoring obtained while on an atypical antipsychotic  BMI: 21.97 TSH: 1.128 Lipid panel: LDL 85 HbgA1c: 5.3 QTc: 415 - Encouraged patient to participate in unit milieu and in scheduled group therapies  - Short Term Goals: Ability to identify changes in lifestyle to reduce recurrence of condition will improve, Ability to verbalize feelings will improve, Ability to disclose and discuss suicidal ideas, Ability to maintain clinical measurements within normal limits will improve, and Compliance with prescribed medications will improve - Long Term Goals: Improvement in symptoms so as ready for discharge   Other PRNS: Agitation, constipation, sleep   Other labs reviewed on admission: As above   3. Medical Issues Being Addressed:   #Hyponatremia - Returned to approximate baseline, no further fluid restriction necessary.   4. Discharge Planning:   -- Social work and case management to assist with discharge planning and identification of hospital follow-up needs prior to discharge  -- Estimated Discharge Date: 12/2-12/3   -- Discharge Concerns: Need to establish a safety plan; Medication compliance and effectiveness  -- Discharge Goals: Return home with outpatient referrals for mental health follow-up including medication management/psychotherapy   I  certify that inpatient services furnished can reasonably be expected to improve the patient's condition.    Luiz Iron, MD PGY-1 Psychiatry

## 2023-09-17 NOTE — Progress Notes (Signed)
   09/17/23 1100  Psych Admission Type (Psych Patients Only)  Admission Status Involuntary  Psychosocial Assessment  Patient Complaints None  Eye Contact Fair  Facial Expression Animated  Affect Anxious  Speech Logical/coherent  Interaction Assertive  Motor Activity Fidgety  Appearance/Hygiene Unremarkable  Behavior Characteristics Appropriate to situation  Mood Depressed  Thought Process  Coherency WDL  Content WDL  Delusions WDL  Perception WDL  Hallucination None reported or observed  Judgment Impaired  Confusion None  Danger to Self  Current suicidal ideation? Denies  Danger to Others  Danger to Others None reported or observed

## 2023-09-17 NOTE — Progress Notes (Signed)
   09/16/23 2031  Psych Admission Type (Psych Patients Only)  Admission Status Involuntary  Psychosocial Assessment  Patient Complaints None  Eye Contact Fair  Facial Expression Sad  Affect Depressed  Speech Logical/coherent  Interaction Assertive  Motor Activity Fidgety  Appearance/Hygiene Unremarkable  Behavior Characteristics Cooperative;Appropriate to situation  Mood Anxious;Depressed  Thought Process  Coherency WDL  Content WDL  Delusions WDL  Perception WDL  Hallucination None reported or observed  Judgment Impaired  Confusion None  Danger to Self  Current suicidal ideation? Denies  Agreement Not to Harm Self Yes  Description of Agreement verbal  Danger to Others  Danger to Others None reported or observed

## 2023-09-17 NOTE — Group Note (Signed)
LCSW Group Therapy Note  Group Date: 09/17/2023 Start Time: 1000 End Time: 1100   Type of Therapy and Topic:  Group Therapy - Healthy vs Unhealthy Coping Skills  Participation Level:  Active   Description of Group The focus of this group was to determine what unhealthy coping techniques typically are used by group members and what healthy coping techniques would be helpful in coping with various problems. Patients were guided in becoming aware of the differences between healthy and unhealthy coping techniques. Patients were asked to identify 2-3 healthy coping skills they would like to learn to use more effectively.  Therapeutic Goals Patients learned that coping is what human beings do all day long to deal with various situations in their lives Patients defined and discussed healthy vs unhealthy coping techniques Patients identified their preferred coping techniques and identified whether these were healthy or unhealthy Patients determined 2-3 healthy coping skills they would like to become more familiar with and use more often. Patients provided support and ideas to each other   Summary of Patient Progress:  During group, Michelle Levy expressed listening to soothing music is her healthy coping mechanism, while refusing to stop an activity until completely done is her unhealthy coping skill. Patient proved open to input from peers and feedback from CSW. Patient demonstrated full insight into the subject matter, was respectful of peers, and participated throughout the entire session.   Therapeutic Modalities Cognitive Behavioral Therapy Motivational Interviewing  Lynnell Chad, Theresia Majors 09/17/2023  11:18 AM

## 2023-09-17 NOTE — Progress Notes (Signed)
   09/17/23 2130  Psych Admission Type (Psych Patients Only)  Admission Status Involuntary  Psychosocial Assessment  Patient Complaints Worrying  Eye Contact Fair  Facial Expression Animated  Affect Appropriate to circumstance  Speech Logical/coherent  Interaction Assertive  Motor Activity Fidgety  Appearance/Hygiene Unremarkable  Behavior Characteristics Cooperative;Appropriate to situation  Mood Depressed  Thought Process  Coherency WDL  Content WDL  Delusions None reported or observed  Perception WDL  Hallucination None reported or observed  Judgment Poor  Confusion None  Danger to Self  Current suicidal ideation? Denies  Agreement Not to Harm Self Yes  Description of Agreement verbally contracts  Danger to Others  Danger to Others None reported or observed

## 2023-09-17 NOTE — Group Note (Signed)
Date:  09/17/2023 Time:  11:41 AM  Group Topic/Focus:  Goals Group:   The focus of this group is to help patients establish daily goals to achieve during treatment and discuss how the patient can incorporate goal setting into their daily lives to aide in recovery. Orientation:   The focus of this group is to educate the patient on the purpose and policies of crisis stabilization and provide a format to answer questions about their admission.  The group details unit policies and expectations of patients while admitted.    Participation Level:  Active  Participation Quality:  Appropriate  Affect:  Appropriate  Cognitive:  Appropriate  Insight: Appropriate  Engagement in Group:  Engaged  Modes of Intervention:  Discussion  Additional Comments:    Axie Hayne D Ebelyn Bohnet 09/17/2023, 11:41 AM

## 2023-09-17 NOTE — [facility] Group Notes (Signed)
[facility] Group Notes:  (Nursing/MHT/Case Management/Adjunct)  Date:  09/17/2023  Time:  2:37 PM  Type of Therapy:  Psychoeducational Skills  Participation Level:  Active  Participation Quality:  Appropriate  Affect:  Appropriate  Cognitive:  Alert and Appropriate  Insight:  Appropriate  Engagement in Group:  Engaged  Modes of Intervention:  Discussion, Education, and Exploration  Summary of Progress/Problems: Pts were educated on the impact of negative thinking, positive reframing and the power of mindfulness. Pts were allowed to discuss one negative thought/habit or coping skill they would like to change to impact their mental health. Pt attended and was appropriate.   Malva Limes 09/17/2023, 2:37 PM

## 2023-09-18 MED ORDER — DIVALPROEX SODIUM ER 250 MG PO TB24: 250.0000 mg | ORAL_TABLET | Freq: Every day | ORAL | Status: DC

## 2023-09-18 MED ORDER — DIVALPROEX SODIUM ER 250 MG PO TB24
750.0000 mg | ORAL_TABLET | Freq: Every day | ORAL | Status: DC
Start: 2023-09-19 — End: 2023-09-20
  Administered 2023-09-19: 750 mg via ORAL
  Filled 2023-09-18 (×3): qty 3

## 2023-09-18 MED ORDER — DIVALPROEX SODIUM ER 500 MG PO TB24
500.0000 mg | ORAL_TABLET | Freq: Once | ORAL | Status: AC
  Administered 2023-09-18: 500 mg via ORAL
  Filled 2023-09-18: qty 1

## 2023-09-18 MED ORDER — DIVALPROEX SODIUM ER 500 MG PO TB24: 500.0000 mg | ORAL_TABLET | Freq: Every day | ORAL | Status: DC

## 2023-09-18 NOTE — Progress Notes (Signed)
D:  Patient's self inventory sheet, patient sleeps good, no sleep medication.  Good appetite, normal energy level, good concentration.  Denied withdrawals.  Denied Si.  Denied physical problems.  Denied physical pain.  Goal is maintain schedule and reach out to others here.  Plans to show up to groups and talk to others.  I am grateful to the staff.  Does have discharge plans. A:  Medications administered per MD orders.  Emotional support and encouragement given others.  R:  Safety maintained with 15 minute checks.  Denied SI and HI, contracts for safety.  Denied A/V hallucinations.  Safety maintained with  15 minute checks.

## 2023-09-18 NOTE — Plan of Care (Signed)
  Problem: Education: Goal: Emotional status will improve Outcome: Progressing Goal: Mental status will improve Outcome: Progressing   Problem: Education: Goal: Emotional status will improve Outcome: Progressing Goal: Mental status will improve Outcome: Progressing

## 2023-09-18 NOTE — Plan of Care (Signed)
  Problem: Coping: Goal: Ability to verbalize frustrations and anger appropriately will improve Outcome: Progressing Goal: Ability to demonstrate self-control will improve Outcome: Progressing   Problem: Safety: Goal: Periods of time without injury will increase Outcome: Progressing   

## 2023-09-18 NOTE — Progress Notes (Signed)
Patient is pleasant and has been observed up in the dayroom interacting appropriately with peers. She is hopeful to go home on tomorrow so she can pick up her pets.   09/18/23 2030  Psych Admission Type (Psych Patients Only)  Admission Status Involuntary  Psychosocial Assessment  Patient Complaints None  Eye Contact Fair  Facial Expression Animated  Affect Appropriate to circumstance  Speech Logical/coherent  Interaction Assertive  Motor Activity Other (Comment) (WNL)  Appearance/Hygiene Unremarkable  Behavior Characteristics Cooperative;Calm  Mood Pleasant  Thought Process  Coherency WDL  Content WDL  Delusions None reported or observed  Perception WDL  Hallucination None reported or observed  Judgment WDL  Confusion None  Danger to Self  Current suicidal ideation? Denies  Agreement Not to Harm Self Yes  Description of Agreement verbally contracts  Danger to Others  Danger to Others None reported or observed

## 2023-09-18 NOTE — [facility] Group Notes (Signed)
[facility] Group Notes:  (Nursing/MHT/Case Management/Adjunct)  Date:  09/18/2023  Time:  12:58 PM  Type of Therapy:  Psychoeducational Skills  Participation Level:  Active  Participation Quality:  Appropriate  Affect:  Appropriate  Cognitive:  Alert and Appropriate  Insight:  Appropriate  Engagement in Group:  Engaged  Modes of Intervention:  Discussion and Education  Summary of Progress/Problems:  Patients were given education on motivation with a podcast from '' [employer] '' in which the guest was DR. ALOK K who talked about dopamine, and tapping into the power you have to get where you want to go in life. ''  Pt attended and was appropriate.   Malva Limes 09/18/2023, 12:58 PM

## 2023-09-18 NOTE — Progress Notes (Signed)
[facility] MD Progress Note  09/18/2023 7:46 AM Michelle Levy  MRN:  [id number]  Principal Problem: Bipolar affective disorder, current episode manic (HCC) Diagnosis: Principal Problem:   Bipolar affective disorder, current episode manic (HCC)  Reason for Admission:  Michelle Levy is a 67 year old female with a past psychiatric history of bipolar 1 disorder, manic, requiring hospitalization; alcohol use disorder, without withdrawal symptoms; and PTSD who was admitted to [facility] for an acute manic episode, with questionable psychotic features.   Information discussed at bed report: Patient had a VPA level checked 11/30-low at 37.  PRNs needed: none.  Information Obtained Today During Patient Interview:   Patient noted that her mood was a little down and "weepy" today, as she is missing her pets.  Is willing to participate in a partial hospitalization program, had questions concerning how much time he would take, whether or not she could do it on Sam.  Assured patient that, however it is carried out, she will have time to take care of her pets and perform her daily tasks.-voiced that she will be compliant with her medications outpatient, and will consult with her psychiatrist before changing or discontinuing her meds.  Slept well.  Noted a little constipation, reminded patient of PRNs already on board.  Denied suicidal ideation and homicidal ideation.  Denied auditory and visual hallucinations.  Denied medication side effects.  Past Psychiatric History:   Current psychiatrist: none. Current therapist: Previously saw pastor at her church, counselor with ex-husband Previous psychiatric diagnoses: Bipolar disorder, mixed, with psychotic features Current psychiatric medications: denied. Psychiatric medication history/compliance:  Started on Lamictal/Seroquel in 2017, stopped approximately 1 year afterwards, did not resume. Psychiatric hospitalization(s):  2017, after a manic episode with psychosis similar  to current presentation p. Psychotherapy history: none. Neuromodulation history: none. History of suicide (obtained from HPI): none. History of homicide or aggression (obtained in HPI): none.  Family Psychiatric History:   Mother: Nervous breakdown requiring hospitalization Sister: Bipolar disorder Sister: Depression Father: Alcoholism   Suicide history: none.  Violence/aggression: none. Substance use history:  EtOH-dad.  Social History:   Living situation:  Lives with self. 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 Medical diagnoses: None Medications: Patient takes no prescribed medications  Allergies: none. Hospitalizations:  None. Surgeries:  C-section, D&C. Trauma: denied. Seizures: denied.  Family History:  Family History  Problem Relation Age of Onset   Alcohol abuse Mother    Bipolar disorder Mother    Alcohol abuse Father    Depression Sister    Bipolar disorder Sister    Breast cancer Neg Hx     Current Medications: Current Facility-Administered Medications  Medication Dose Route Frequency Provider Last Rate Last Admin   acetaminophen (TYLENOL) tablet 650 mg  650 mg Oral Q6H PRN Rankin, Shuvon B, NP       alum & mag hydroxide-simeth (MAALOX/MYLANTA) 200-200-20 MG/5ML suspension 30 mL  30 mL Oral Q4H PRN Rankin, Shuvon B, NP       divalproex (DEPAKOTE ER) 24 hr tablet 250 mg  250 mg Oral BID Tomie China, MD   250 mg at 09/17/23 1719   hydrOXYzine (ATARAX) tablet 25 mg  25 mg Oral TID PRN Rankin, Shuvon B, NP       magnesium hydroxide (MILK OF MAGNESIA) suspension 30 mL  30 mL Oral Daily PRN Rankin, Shuvon B, NP       multivitamin with minerals tablet 1 tablet  1 tablet Oral Daily Rankin, Shuvon B, NP   1 tablet at 09/17/23 0751    OLANZapine (ZYPREXA) tablet 15 mg  15 mg Oral Devoria Glassing, MD   15 mg at 09/17/23 2122   OLANZapine zydis (ZYPREXA) disintegrating tablet 5 mg  5 mg Oral TID PRN Rankin, Shuvon B, NP       traZODone (DESYREL) tablet 50 mg  50 mg Oral QHS PRN Rankin, Shuvon B, NP       vitamin D3 (CHOLECALCIFEROL) tablet 1,000 Units  1,000 Units Oral Daily Rankin, Shuvon B, NP   1,000 Units at 09/17/23 0751    Lab Results:  Results for orders placed or performed during the hospital encounter of 09/07/23 (from the past 48 hour(s))  Valproic acid level     Status: Abnormal   Collection Time: 09/17/23  6:14 PM  Result Value Ref Range   Valproic Acid Lvl 37 (L) 50.0 - 100.0 ug/mL    Comment: Performed at [facility], [address], [place], [place] [address]     Blood Alcohol level:  Lab Results  Component Value Date   ETH <10 09/06/2023   ETH 159 (H) 11/20/2015    Metabolic Labs: Lab Results  Component Value Date   HGBA1C 5.3 09/06/2023   MPG 105.41 09/06/2023   MPG 108 11/22/2015   Lab Results  Component Value Date   PROLACTIN 14.9 11/22/2015   Lab Results  Component Value Date   CHOL 198 09/06/2023   TRIG 29 09/06/2023   HDL 107 09/06/2023   CHOLHDL 1.9 09/06/2023   VLDL 6 09/06/2023   LDLCALC 85 09/06/2023   LDLCALC 101 (H) 11/22/2015    Physical Findings: AIMS: No  Psychiatric Specialty Exam:  Presentation  General Appearance: Appropriate for Environment  Eye Contact:Fair  Speech:Clear and Coherent  Speech Volume:Normal  Handedness:Right   Mood and Affect  Mood:Euthymic  Affect:Full Range; Non-Congruent; Restricted   Thought Process  Thought Processes:Coherent  Descriptions of Associations:Circumstantial  Orientation:Full (Time, Place and Person)  Thought Content: WDL  History of Schizophrenia/Schizoaffective disorder:No   Hallucinations: None   Ideas of Reference:None  Suicidal Thoughts:Suicidal Thoughts:  No   Homicidal Thoughts:Homicidal Thoughts: No    Sensorium  Memory:Immediate Fair  Judgment:Fair  Insight:Shallow   Executive Functions  Concentration:Fair  Attention Span:Fair  Recall:Fair  Fund of Knowledge:Fair  Language:Fair   Psychomotor Activity  Psychomotor Activity: Normal    Agricultural engineer; Desire for Improvement   Sleep  Sleep: Fair Number of Hours of Sleep: 8   Physical Exam Constitutional:      General: She is not in acute distress.    Appearance: Normal appearance. She is normal weight. She is not ill-appearing.  HENT:     Head: Atraumatic.  Eyes:     Extraocular Movements: Extraocular movements intact.  Pulmonary:     Effort: Pulmonary effort is normal. No respiratory distress.  Musculoskeletal:        General: No deformity. Normal range of motion.  Neurological:     General: No focal deficit present.     Mental Status: Mental status is at baseline.    Review of Systems  Psychiatric/Behavioral:  Positive for hallucinations. The patient is nervous/anxious.   All other systems reviewed and are negative.  Blood pressure 117/67, pulse 76, temperature 97.9 F (36.6 C), temperature source Oral, resp.  rate 18, height 5\' 3"  (1.6 m), weight 56.2 kg, SpO2 100%. Body mass index is 21.97 kg/m.  Treatment Plan Summary: Daily contact with patient to assess and evaluate symptoms and progress in treatment and Medication management   ASSESSMENT:  Alondra Usey is a 67 year old female with a past psychiatric history of bipolar 1 disorder, manic, requiring hospitalization; alcohol use disorder, without withdrawal symptoms; and PTSD who was admitted to [facility] for an acute manic episode, with questionable psychotic features.   Diagnoses / Active Problems: Bipolar 1 disorder, in manic episode, with psychotic features Alcohol use disorder Posttraumatic stress disorder  PLAN: Safety and Monitoring:  --  INVOLUNTARY admission to  inpatient psychiatric unit for safety, stabilization and treatment  -- Daily contact with patient to assess and evaluate symptoms and progress in treatment  -- Patient's case to be discussed in multi-disciplinary team meeting  -- Observation Level : q15 minute checks  -- Vital signs:  q12 hours  -- Precautions: suicide, elopement, and assault  2. Psychiatric Diagnoses and Treatment:  # Bipolar 1 disorder, and manic episode, with psychotic features # Alcohol use disorder # PTSD - Continue Zyprexa 15 mg nightly for mania.  SW to send PHP paperwork when able. - Increase Depakote from 250mg  BID to 750 mg nightly for mania. VPA returned 11/30 at 37.  We will repeat 12/2 as planned. - Awaiting Thiamine lab.  - Options for D/C: To discharge home with partial hospitalization program after VPA reaches a therapeutic level. - The risks/benefits/side-effects/alternatives to this medication were discussed in detail with the patient and time was given for questions. The patient consents to medication trial.  - Metabolic profile and EKG monitoring obtained while on an atypical antipsychotic  BMI: 21.97 TSH: 1.128 Lipid panel: LDL 85 HbgA1c: 5.3 QTc: 415 - Encouraged patient to participate in unit milieu and in scheduled group therapies  - Short Term Goals: Ability to identify changes in lifestyle to reduce recurrence of condition will improve, Ability to verbalize feelings will improve, Ability to disclose and discuss suicidal ideas, Ability to maintain clinical measurements within normal limits will improve, and Compliance with prescribed medications will improve - Long Term Goals: Improvement in symptoms so as ready for discharge   Other PRNS: Agitation, constipation, sleep   Other labs reviewed on admission: As above   3. Medical Issues Being Addressed:   #Hyponatremia - Returned to approximate baseline, no further fluid restriction necessary.   4. Discharge Planning:   -- Social work and  case management to assist with discharge planning and identification of hospital follow-up needs prior to discharge  -- Estimated Discharge Date: 12/2-12/3   -- Discharge Concerns: Need to establish a safety plan; Medication compliance and effectiveness  -- Discharge Goals: Return home with outpatient referrals for mental health follow-up including medication management/psychotherapy   I certify that inpatient services furnished can reasonably be expected to improve the patient's condition.    Luiz Iron, MD PGY-1 Psychiatry

## 2023-09-18 NOTE — Plan of Care (Signed)
Nurse discussed anxiety, depression and coping skills with patient.  

## 2023-09-18 NOTE — [facility] Group Notes (Signed)
[facility] Group Notes:  (Nursing/MHT/Case Management/Adjunct)  Date:  09/18/2023  Time:  9:14 AM  Type of Therapy:   Goals  Participation Level:  Active  Participation Quality:  Appropriate  Affect:  Appropriate  Cognitive:  Appropriate  Insight:  Appropriate  Engagement in Group:  Engaged  Modes of Intervention:  Discussion  Summary of Progress/Problems: Goal is to maintain a schedule and find out about discharge  Azalee Course 09/18/2023, 9:14 AM

## 2023-09-18 NOTE — [facility] Group Notes (Signed)
[facility] Group Notes:  (Nursing/MHT/Case Management/Adjunct)  Date:  09/18/2023  Time:  9:35 PM  Type of Therapy:  Psychoeducational Skills  Participation Level:  Active  Participation Quality:  Appropriate  Affect:  Appropriate  Cognitive:  Appropriate  Insight:  Good  Engagement in Group:  Engaged  Modes of Intervention:  Education  Summary of Progress/Problems: Patient rated her day as a 10 out of 10 and that she misses her family. She verbalized that she was very happy since her daughter came in to visit her this evening. She was also grateful for her nurse who encouraged her this evening.   Hazle Coca S 09/18/2023, 9:35 PM

## 2023-09-19 ENCOUNTER — Ambulatory Visit: Payer: PPO

## 2023-09-19 ENCOUNTER — Encounter (HOSPITAL_COMMUNITY): Payer: Self-pay

## 2023-09-19 LAB — VALPROIC ACID LEVEL: Valproic Acid Lvl: 37 ug/mL — ABNORMAL LOW (ref 50.0–100.0)

## 2023-09-19 NOTE — Discharge Planning (Signed)
LCSW was informed by MD that patient expressed an interest in PHP at discharge. LCSW contacted Amber in Admissions at [facility] regarding referral and was advised to fax over clinicals to [phone]. Referral has been sent and phone screening has been set up for tomorrow 09/20/23 at 1:00pm. Patient is to call [phone] to complete screening.   LCSW went and spoke with patient to provide an update. Patient reports an interest in [facility] and was provided the number to follow up with Amber on tomorrow at 1:00pm. Patient reported appreciation for LCSW assistance. Patient reports her plan is to discharge home with continued support from family, friends, and church members. Patient reports she has been able to speak with both her son and daughter and they all agree that this admission has strengthen their relationship, and they all want to do better with supporting one another. Patient reports she is also receiving help from her ex-husband who is providing car and covering expenses for her dogs. Patient reports she will make the phone call on tomorrow and hopes she will be able to discharge home with the additional support within the next day or so. No other needs were reported at this time.   Fernande Boyden, LCSW Clinical Social Worker [facility]

## 2023-09-19 NOTE — Plan of Care (Signed)

## 2023-09-19 NOTE — Group Note (Signed)
Recreation Therapy Group Note   Group Topic:Healthy Decision Making  Group Date: 09/19/2023 Start Time: 0935 End Time: 1010 Facilitators: Icess Bertoni-McCall, LRT,CTRS Location: [address] Dayroom   Group Topic: Decision Making, Problem Solving, Communication  Goal Area(s) Addresses:  Patient will effectively work with peer towards shared goal.  Patient will identify factors that guided their decision making.  Patient will pro-socially communicate ideas during group session.   Intervention: Survival Scenario - pencil, paper  Group Description: Patients were given a scenario that they were going to be stranded on a deserted [place] for several months before being rescued. Writer tasked them with making a list of 15 things they would choose to bring with them for "survival". The list of items was prioritized most important to least. Each patient would come up with their own list, then work together to create a new list of 15 items while in a group of 3-5 peers. LRT discussed each person's list and how it differed from others. The debrief included discussion of priorities, good decisions versus bad decisions, and how it is important to think before acting so we can make the best decision possible. LRT tied the concept of effective communication among group members to patient's support systems outside of the hospital and its benefit post discharge.  Education: Pharmacist, community, Priorities, Support System, Discharge Planning   Education Outcome: Acknowledges education/In group clarification/Needs additional education   Affect/Mood: N/A   Participation Level: Did not attend    Clinical Observations/Individualized Feedback:      Plan: Continue to engage patient in RT group sessions 2-3x/week.   Ulisses Vondrak-McCall, LRT,CTRS 09/19/2023 12:31 PM

## 2023-09-19 NOTE — Progress Notes (Signed)
   09/19/23 2030  Psych Admission Type (Psych Patients Only)  Admission Status Involuntary  Psychosocial Assessment  Patient Complaints None  Eye Contact Fair  Facial Expression Animated;Anxious  Affect Appropriate to circumstance  Speech Logical/coherent  Interaction Assertive  Motor Activity Fidgety  Appearance/Hygiene Unremarkable  Behavior Characteristics Cooperative  Mood Pleasant  Aggressive Behavior  Effect No apparent injury  Thought Process  Coherency WDL  Content WDL  Delusions None reported or observed  Perception WDL  Hallucination None reported or observed  Judgment WDL  Confusion None  Danger to Self  Current suicidal ideation? Denies

## 2023-09-19 NOTE — Group Note (Signed)
Date:  09/19/2023 Time:  8:10 PM  Group Topic/Focus:  Alcoholics Anonymous Meeting    Participation Level:  Active  Participation Quality:  Appropriate  Affect:  Appropriate  Cognitive:  Appropriate  Insight: Appropriate  Engagement in Group:  Engaged  Modes of Intervention:  Discussion and Support  Additional Comments:  Patient attended AA.  Kennieth Francois 09/19/2023, 8:10 PM

## 2023-09-19 NOTE — Progress Notes (Signed)
   09/19/23 0910  Psych Admission Type (Psych Patients Only)  Admission Status Involuntary  Psychosocial Assessment  Patient Complaints None  Eye Contact Fair  Facial Expression Animated;Anxious  Affect Appropriate to circumstance  Speech Logical/coherent  Interaction Assertive  Motor Activity Fidgety  Appearance/Hygiene Unremarkable  Behavior Characteristics Cooperative  Mood Pleasant  Thought Process  Coherency WDL  Content WDL  Delusions None reported or observed  Perception WDL  Hallucination None reported or observed  Judgment WDL  Confusion None  Danger to Self  Current suicidal ideation? Denies  Agreement Not to Harm Self Yes  Description of Agreement verbal  Danger to Others  Danger to Others None reported or observed

## 2023-09-19 NOTE — Plan of Care (Signed)
  Problem: Activity: Goal: Interest or engagement in activities will improve Outcome: Progressing   Problem: Coping: Goal: Ability to demonstrate self-control will improve Outcome: Progressing   Problem: Safety: Goal: Periods of time without injury will increase Outcome: Progressing   

## 2023-09-19 NOTE — Progress Notes (Signed)
[facility] MD Progress Note  09/19/2023 4:17 PM Michelle Levy  MRN:  [id number]  Principal Problem: Bipolar affective disorder, current episode manic (HCC) Diagnosis: Principal Problem:   Bipolar affective disorder, current episode manic (HCC)  Reason for Admission:  Michelle Levy is a 67 year old female with a past psychiatric history of bipolar 1 disorder, manic, requiring hospitalization; alcohol use disorder, without withdrawal symptoms; and PTSD who was admitted to [facility] for an acute manic episode, with questionable psychotic features.   Information discussed at bed report: Patient had a VPA level checked 11/30-low at 37. Awaiting recheck 12/2  PRNs needed: none.  Information Obtained Today During Patient Interview:   Patient was met in small group room for initial interview with new provider. PT was polite, cooperative, and engaged fully in the discussion. Is willing to participate in a partial hospitalization program.  Assured patient that, however it is carried out, she will have time to take care of her pets and perform her daily tasks.-voiced that she will be compliant with her medications outpatient, and will consult with her psychiatrist before changing or discontinuing her meds.  Slept well.  No GI symptoms, no headache, no other physical symptoms.  Denied suicidal ideation and homicidal ideation.  Denied auditory and visual hallucinations.  Denied medication side effects.  Past Psychiatric History:   Current psychiatrist: none. Current therapist: Previously saw pastor at her church, counselor with ex-husband Previous psychiatric diagnoses: Bipolar disorder, mixed, with psychotic features Current psychiatric medications: denied. Psychiatric medication history/compliance:  Started on Lamictal/Seroquel in 2017, stopped approximately 1 year afterwards, did not resume. Psychiatric hospitalization(s):  2017, after a manic episode with psychosis similar to current presentation. Psychotherapy  history: none. Neuromodulation history: none. History of suicide (obtained from HPI): none. History of homicide or aggression (obtained in HPI): none.  Family Psychiatric History:   Mother: Nervous breakdown requiring hospitalization Sister: Bipolar disorder Sister: Depression Father: Alcoholism   Suicide history: none.  Violence/aggression: none. Substance use history:  EtOH-dad.  Social History:   Living situation:  Lives with self. 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 Medical diagnoses: None Medications: Patient takes no prescribed medications  Allergies: none. Hospitalizations:  None. Surgeries:  C-section, D&C. Trauma: denied. Seizures: denied.  Family History:  Family History  Problem Relation Age of Onset   Alcohol abuse Mother    Bipolar disorder Mother    Alcohol abuse Father    Depression Sister    Bipolar disorder Sister    Breast cancer Neg Hx     Current Medications: Current Facility-Administered Medications  Medication Dose Route Frequency Provider Last Rate Last Admin   acetaminophen (TYLENOL) tablet 650 mg  650 mg Oral Q6H PRN Rankin, Shuvon B, NP       alum & mag hydroxide-simeth (MAALOX/MYLANTA) 200-200-20 MG/5ML suspension 30 mL  30 mL Oral Q4H PRN Rankin, Shuvon B, NP       divalproex (DEPAKOTE ER) 24 hr tablet 750 mg  750 mg Oral QHS Tomie China, MD       hydrOXYzine (ATARAX) tablet 25 mg  25 mg Oral TID PRN Rankin, Shuvon B, NP       magnesium hydroxide (MILK OF MAGNESIA) suspension 30 mL  30 mL Oral Daily PRN Rankin, Shuvon B, NP   30 mL at 09/18/23 1527  multivitamin with minerals tablet 1 tablet  1 tablet Oral Daily Rankin, Shuvon B, NP   1 tablet at 09/19/23 0802   OLANZapine (ZYPREXA) tablet 15 mg  15 mg  Oral Devoria Glassing, MD   15 mg at 09/18/23 2059   OLANZapine zydis (ZYPREXA) disintegrating tablet 5 mg  5 mg Oral TID PRN Rankin, Shuvon B, NP       traZODone (DESYREL) tablet 50 mg  50 mg Oral QHS PRN Rankin, Shuvon B, NP       vitamin D3 (CHOLECALCIFEROL) tablet 1,000 Units  1,000 Units Oral Daily Rankin, Shuvon B, NP   1,000 Units at 09/19/23 0800    Lab Results:  Results for orders placed or performed during the hospital encounter of 09/07/23 (from the past 48 hour(s))  Valproic acid level     Status: Abnormal   Collection Time: 09/17/23  6:14 PM  Result Value Ref Range   Valproic Acid Lvl 37 (L) 50.0 - 100.0 ug/mL    Comment: Performed at [facility], [address], [place], [place] [address]     Blood Alcohol level:  Lab Results  Component Value Date   ETH <10 09/06/2023   ETH 159 (H) 11/20/2015    Metabolic Labs: Lab Results  Component Value Date   HGBA1C 5.3 09/06/2023   MPG 105.41 09/06/2023   MPG 108 11/22/2015   Lab Results  Component Value Date   PROLACTIN 14.9 11/22/2015   Lab Results  Component Value Date   CHOL 198 09/06/2023   TRIG 29 09/06/2023   HDL 107 09/06/2023   CHOLHDL 1.9 09/06/2023   VLDL 6 09/06/2023   LDLCALC 85 09/06/2023   LDLCALC 101 (H) 11/22/2015    Physical Findings: AIMS: No  Psychiatric Specialty Exam:  Presentation  General Appearance: Appropriate for Environment; Neat; Well Groomed  Eye Contact:Good  Speech:Clear and Coherent; Normal Rate  Speech Volume:Normal  Handedness:Right   Mood and Affect  Mood:Euthymic  Affect:Congruent; Appropriate   Thought Process  Thought Processes:Coherent; Goal Directed; Linear  Descriptions of Associations:Intact  Orientation:Full (Time, Place and Person)  Thought Content: WDL  History of Schizophrenia/Schizoaffective disorder:No   Hallucinations: None   Ideas of Reference:None  Suicidal Thoughts:Suicidal Thoughts: No   Homicidal  Thoughts:Homicidal Thoughts: No    Sensorium  Memory:Immediate Good; Recent Good; Remote Good  Judgment:Good  Insight:Fair   Executive Functions  Concentration:Good  Attention Span:Good  Recall:Good  Fund of Knowledge:Good  Language:Good   Psychomotor Activity  Psychomotor Activity: Normal  Assets  Desire for Improvement; Manufacturing systems engineer; Talents/Skills; Vocational/Educational  Sleep  Sleep: Good Number of Hours of Sleep: 8.5   Physical Exam Constitutional:      General: She is not in acute distress.    Appearance: Normal appearance. She is normal weight. She is not ill-appearing.  HENT:     Head: Atraumatic.  Eyes:     Extraocular Movements: Extraocular movements intact.  Pulmonary:     Effort: Pulmonary effort is normal. No respiratory distress.  Musculoskeletal:        General: No deformity. Normal range of motion.  Neurological:     General: No focal deficit present.     Mental Status: Mental status is at baseline.    Review of Systems  Constitutional:  Negative for fever.  Respiratory:  Negative for cough and shortness of breath.   Cardiovascular:  Negative for chest pain.  Gastrointestinal:  Negative for abdominal pain, diarrhea, nausea and vomiting.  Genitourinary:  Negative for  dysuria and urgency.  Neurological:  Negative for tremors, sensory change and weakness.  Psychiatric/Behavioral:  Negative for hallucinations and suicidal ideas. The patient does not have insomnia.   All other systems reviewed and are negative.  Blood pressure 109/66, pulse 77, temperature 97.8 F (36.6 C), temperature source Oral, resp. rate 16, height 5\' 3"  (1.6 m), weight 56.2 kg, SpO2 99%. Body mass index is 21.97 kg/m.  Treatment Plan Summary: Daily contact with patient to assess and evaluate symptoms and progress in treatment and Medication management   ASSESSMENT:  Michelle Levy is a 67 year old female with a past psychiatric history of bipolar 1  disorder, manic, requiring hospitalization; alcohol use disorder, without withdrawal symptoms; and PTSD who was admitted to [facility] for an acute manic episode, with questionable psychotic features.   Diagnoses / Active Problems: Bipolar 1 disorder, in manic episode, with psychotic features Alcohol use disorder Posttraumatic stress disorder  PLAN: Safety and Monitoring:  --  INVOLUNTARY admission to inpatient psychiatric unit for safety, stabilization and treatment  -- Daily contact with patient to assess and evaluate symptoms and progress in treatment  -- Patient's case to be discussed in multi-disciplinary team meeting  -- Observation Level : q15 minute checks  -- Vital signs:  q12 hours  -- Precautions: suicide, elopement, and assault  2. Psychiatric Diagnoses and Treatment:  # Bipolar 1 disorder, and manic episode, with psychotic features # Alcohol use disorder # PTSD - Continue Zyprexa 15 mg nightly for mania.  SW to send PHP paperwork when able. - Continue Depakote 750 mg nightly for mania. VPA returned 11/30 at 37.  We will repeat 12/2 as planned. - Options for D/C: To discharge home with partial hospitalization program after VPA reaches a therapeutic level. - The risks/benefits/side-effects/alternatives to this medication were discussed in detail with the patient and time was given for questions. The patient consents to medication trial.  - Metabolic profile and EKG monitoring obtained while on an atypical antipsychotic  BMI: 21.97 TSH: 1.128 Lipid panel: LDL 85 HbgA1c: 5.3 QTc: 415 - Encouraged patient to participate in unit milieu and in scheduled group therapies  - Short Term Goals: Ability to identify changes in lifestyle to reduce recurrence of condition will improve, Ability to verbalize feelings will improve, Ability to disclose and discuss suicidal ideas, Ability to maintain clinical measurements within normal limits will improve, and Compliance with prescribed medications  will improve - Long Term Goals: Improvement in symptoms so as ready for discharge   Other PRNS: Agitation, constipation, sleep   Other labs reviewed on admission: As above   3. Medical Issues Being Addressed:   #Hyponatremia - Returned to approximate baseline, no further fluid restriction necessary.   4. Discharge Planning:   -- Social work and case management to assist with discharge planning and identification of hospital follow-up needs prior to discharge  -- Estimated Discharge Date: 12/3   -- Discharge Concerns: Need to establish a safety plan; Medication compliance and effectiveness  -- Discharge Goals: Return home with outpatient referrals for mental health follow-up including medication management/psychotherapy   I certify that inpatient services furnished can reasonably be expected to improve the patient's condition.    Signed: Christie Nottingham, MD [facility] Physician, PGY-1 09/19/2023 4:22 PM

## 2023-09-19 NOTE — B[facility] Group Notes (Signed)
Spiritual care group on grief and loss facilitated by Chaplain Dyanne Carrel, [facility]  Group Goal: Support / Education around grief and loss  Members engage in facilitated group support and psycho-social education.  Group Description:  Following introductions and group rules, group members engaged in facilitated group dialogue and support around topic of loss, with particular support around experiences of loss in their lives. Group Identified types of loss (relationships / self / things) and identified patterns, circumstances, and changes that precipitate losses. Reflected on thoughts / feelings around loss, normalized grief responses, and recognized variety in grief experience. Group encouraged individual reflection on safe space and on the coping skills that they are already utilizing.  Group drew on Adlerian / Rogerian and narrative framework  Patient Progress: Michelle Levy attended group and actively engaged and participated in group conversation and activities.

## 2023-09-19 NOTE — Group Note (Signed)
Date:  09/19/2023 Time:  11:31 AM  Group Topic/Focus:  Goals Group:   The focus of this group is to help patients establish daily goals to achieve during treatment and discuss how the patient can incorporate goal setting into their daily lives to aide in recovery.    Participation Level:  Active  Participation Quality:  Appropriate  Affect:  Appropriate  Cognitive:  Alert  Insight: Appropriate  Engagement in Group:  Engaged  Modes of Intervention:  Discussion  Additional Comments:    Beckie Busing 09/19/2023, 11:31 AM

## 2023-09-19 NOTE — BH IP Treatment Plan (Signed)
Interdisciplinary Treatment and Diagnostic Plan Update  09/19/2023 Time of Session: 12:05PM UPDATE Michelle Levy MRN: [id number]  Principal Diagnosis: Bipolar affective disorder, current episode manic (HCC)  Secondary Diagnoses: Principal Problem:   Bipolar affective disorder, current episode manic (HCC)   Current Medications:  Current Facility-Administered Medications  Medication Dose Route Frequency Provider Last Rate Last Admin   acetaminophen (TYLENOL) tablet 650 mg  650 mg Oral Q6H PRN Rankin, Shuvon B, NP       alum & mag hydroxide-simeth (MAALOX/MYLANTA) 200-200-20 MG/5ML suspension 30 mL  30 mL Oral Q4H PRN Rankin, Shuvon B, NP       divalproex (DEPAKOTE ER) 24 hr tablet 750 mg  750 mg Oral QHS Tomie China, MD       hydrOXYzine (ATARAX) tablet 25 mg  25 mg Oral TID PRN Rankin, Shuvon B, NP       magnesium hydroxide (MILK OF MAGNESIA) suspension 30 mL  30 mL Oral Daily PRN Rankin, Shuvon B, NP   30 mL at 09/18/23 1527   multivitamin with minerals tablet 1 tablet  1 tablet Oral Daily Rankin, Shuvon B, NP   1 tablet at 09/19/23 0802   OLANZapine (ZYPREXA) tablet 15 mg  15 mg Oral Devoria Glassing, MD   15 mg at 09/18/23 2059   OLANZapine zydis (ZYPREXA) disintegrating tablet 5 mg  5 mg Oral TID PRN Rankin, Shuvon B, NP       traZODone (DESYREL) tablet 50 mg  50 mg Oral QHS PRN Rankin, Shuvon B, NP       vitamin D3 (CHOLECALCIFEROL) tablet 1,000 Units  1,000 Units Oral Daily Rankin, Shuvon B, NP   1,000 Units at 09/19/23 0800   PTA Medications: Medications Prior to Admission  Medication Sig Dispense Refill Last Dose   B Complex-C (B-COMPLEX WITH VITAMIN C) tablet Take 1 tablet by mouth daily.      calcium carbonate (OS-CAL - DOSED IN MG OF ELEMENTAL CALCIUM) 1250 (500 Ca) MG tablet Take 1 tablet by mouth daily with breakfast.      Collagen-Vitamin C-Biotin (COLLAGEN PO) Take 1 Units by mouth daily.      magnesium oxide (MAG-OX) 400 (240 Mg) MG tablet Take 400 mg by  mouth daily.      omega-3 acid ethyl esters (LOVAZA) 1 g capsule Take 1 g by mouth daily.      cholecalciferol (VITAMIN D) 1000 units tablet Take 1,000 Units by mouth daily.      Multiple Vitamin (MULTIVITAMIN WITH MINERALS) TABS tablet Take 1 tablet by mouth daily. 30 tablet 0     Patient Stressors: Other: manic symptoms    Patient Strengths: Active sense of humor  Capable of independent living  Communication skills  Financial means  General fund of knowledge  Supportive family/friends   Treatment Modalities: Medication Management, Group therapy, Case management,  1 to 1 session with clinician, Psychoeducation, Recreational therapy.   Physician Treatment Plan for Primary Diagnosis: Bipolar affective disorder, current episode manic (HCC) Long Term Goal(s):     Short Term Goals: Ability to identify changes in lifestyle to reduce recurrence of condition will improve Ability to verbalize feelings will improve Ability to disclose and discuss suicidal ideas Ability to maintain clinical measurements within normal limits will improve Compliance with prescribed medications will improve  Medication Management: Evaluate patient's response, side effects, and tolerance of medication regimen.  Therapeutic Interventions: 1 to 1 sessions, Unit Group sessions and Medication administration.  Evaluation of Outcomes: Progressing  Physician Treatment Plan  for Secondary Diagnosis: Principal Problem:   Bipolar affective disorder, current episode manic (HCC)  Long Term Goal(s):     Short Term Goals: Ability to identify changes in lifestyle to reduce recurrence of condition will improve Ability to verbalize feelings will improve Ability to disclose and discuss suicidal ideas Ability to maintain clinical measurements within normal limits will improve Compliance with prescribed medications will improve     Medication Management: Evaluate patient's response, side effects, and tolerance of  medication regimen.  Therapeutic Interventions: 1 to 1 sessions, Unit Group sessions and Medication administration.  Evaluation of Outcomes: Progressing   RN Treatment Plan for Primary Diagnosis: Bipolar affective disorder, current episode manic (HCC) Long Term Goal(s): Knowledge of disease and therapeutic regimen to maintain health will improve  Short Term Goals: Ability to remain free from injury will improve, Ability to verbalize frustration and anger appropriately will improve, Ability to participate in decision making will improve, Ability to verbalize feelings will improve, Ability to identify and develop effective coping behaviors will improve, and Compliance with prescribed medications will improve  Medication Management: RN will administer medications as ordered by provider, will assess and evaluate patient's response and provide education to patient for prescribed medication. RN will report any adverse and/or side effects to prescribing provider.  Therapeutic Interventions: 1 on 1 counseling sessions, Psychoeducation, Medication administration, Evaluate responses to treatment, Monitor vital signs and CBGs as ordered, Perform/monitor CIWA, COWS, AIMS and Fall Risk screenings as ordered, Perform wound care treatments as ordered.  Evaluation of Outcomes: Progressing   LCSW Treatment Plan for Primary Diagnosis: Bipolar affective disorder, current episode manic (HCC) Long Term Goal(s): Safe transition to appropriate next level of care at discharge, Engage patient in therapeutic group addressing interpersonal concerns.  Short Term Goals: Engage patient in aftercare planning with referrals and resources, Increase ability to appropriately verbalize feelings, Facilitate acceptance of mental health diagnosis and concerns, Facilitate patient progression through stages of change regarding substance use diagnoses and concerns, and Increase skills for wellness and recovery  Therapeutic  Interventions: Assess for all discharge needs, 1 to 1 time with Social worker, Explore available resources and support systems, Assess for adequacy in community support network, Educate family and significant other(s) on suicide prevention, Complete Psychosocial Assessment, Interpersonal group therapy.  Evaluation of Outcomes: Progressing   Progress in Treatment: Attending groups: Yes. Participating in groups: Yes. Taking medication as prescribed: Yes. Toleration medication: Yes. Family/Significant other contact made: Yes, contacted:  Toni Amend Rawley/friend [phone] Patient understands diagnosis: Yes. Discussing patient identified problems/goals with staff: Yes. Medical problems stabilized or resolved: Yes. Denies suicidal/homicidal ideation: Yes. Issues/concerns per patient self-inventory: No.   New problem(s) identified: No, Describe:  None reported   New Short Term/Long Term Goal(s): medication stabilization, elimination of SI thoughts, development of comprehensive mental wellness plan.     Patient Goals:  "Continue working on my wellness, make friends, introduce people to church"   Discharge Plan or Barriers: Patient recently admitted. CSW will continue to follow and assess for appropriate referrals and possible discharge planning.      Reason for Continuation of Hospitalization: Anxiety Medication stabilization   Estimated Length of Stay: 2-3 days (Has PHP phone screening 12/3)  Last 3 [place] Suicide Severity Risk Score: Flowsheet Row [facility] ([facility]  C-SSRS RISK CATEGORY No Risk No Risk       Last PHQ 2/9 Scores:     No data to  display          Scribe for Treatment Team: Kathi Der, Theresia Majors 09/19/2023 1:11 PM

## 2023-09-20 LAB — VITAMIN B1: Vitamin B1 (Thiamine): 140.8 nmol/L (ref 66.5–200.0)

## 2023-09-20 MED ORDER — HYDROXYZINE HCL 25 MG PO TABS: 25.0000 mg | ORAL_TABLET | Freq: Three times a day (TID) | ORAL | 0 refills | Status: DC | PRN

## 2023-09-20 MED ORDER — DIVALPROEX SODIUM ER 250 MG PO TB24: 750.0000 mg | ORAL_TABLET | Freq: Every day | ORAL | 0 refills | Status: AC

## 2023-09-20 MED ORDER — TRAZODONE HCL 50 MG PO TABS: 50.0000 mg | ORAL_TABLET | Freq: Every evening | ORAL | 0 refills | Status: DC | PRN

## 2023-09-20 MED ORDER — OLANZAPINE 15 MG PO TABS: 15.0000 mg | ORAL_TABLET | Freq: Every day | ORAL | 0 refills | Status: DC

## 2023-09-20 NOTE — Progress Notes (Signed)
Pt discharged to lobby. Pt was stable and appreciative at that time. All papers and prescriptions were given and valuables returned. Verbal understanding expressed. Denies SI/HI and A/VH. Pt given opportunity to express concerns and ask questions.  

## 2023-09-20 NOTE — Plan of Care (Signed)
°  Problem: Education: °Goal: Emotional status will improve °Outcome: Progressing °Goal: Mental status will improve °Outcome: Progressing °Goal: Verbalization of understanding the information provided will improve °Outcome: Progressing °  °

## 2023-09-20 NOTE — Progress Notes (Signed)
  [facility] Adult Case Management Discharge Plan :  Will you be returning to the same living situation after discharge:  Yes,  Patient is returning back to her home with follow up provided by family as needed.  Patient will also be seen on an intensive outpatient basis with [facility] for individual and group therapy. Patient will receive outpatient medication management with [facility]. Appts are listed below At discharge, do you have transportation home?: Yes,  per patient her ex-husband will transport the patient back home on today.  Do you have the ability to pay for your medications: Yes,  patient is insured and has additional support from family.   Release of information consent forms completed and in the chart;  Patient's signature needed at discharge.  Patient to Follow up at:  Follow-up Information     [facility] Go on 09/28/2023.   Why: You have an appointment for medication management services on  09/28/23 at 2:00 pm.  The appointment will be held in person. Contact information: [address] [place] [address] [phone]         [facility] [place], [place]. Go on 09/22/2023.   Why: Agency Known as [facility]; Patient has been scheduled for Intensive Outpatient follow up with this agency on Thursday 09/22/2023 with [facility]. Agency will reach out to patient to establish most appropriate time. Patient will be seen by therapist once a week with included group therapy. Contact information: [address] [facility] [place] [address] [phone]                 Next level of care provider has access to [facility] Link:no  Safety Planning and Suicide Prevention discussed: Yes,  Providers have spoken with family and friends regarding their concerns and family is in agreement with discharge on today.      Has patient been referred to the Quitline?: Patient does not use tobacco/nicotine products  Patient has been referred  for addiction treatment: No known substance use disorder.  Loleta Dicker, LCSW 09/20/2023, 3:00 PM

## 2023-09-20 NOTE — Discharge Summary (Signed)
Physician Discharge Summary Note  Patient:  Michelle Levy is an 67 y.o., female MRN:  [id number] DOB:  [birth year] Patient phone:  [phone] (home)  Patient address:   [address] [place] [address],  Total Time spent with patient: 45 minutes  Date of Admission:  09/07/2023 Date of Discharge: 09/20/2023  Reason for Admission:  Bipolar Affective disorder, Manic Episode  Principal Problem: Bipolar affective disorder, current episode manic [facility]) Discharge Diagnoses: Principal Problem:   Bipolar affective disorder, current episode manic (HCC)   Past Psychiatric History: Michelle Levy is a 67 year old female with a past psychiatric history of bipolar 1 disorder, manic, requiring hospitalization; alcohol use disorder, without withdrawal symptoms; and PTSD who was admitted to [facility] for an acute manic episode, with questionable psychotic features.   Per chart review, patient presented on 11/9 to [facility] under IVC by her daughter who attested that patient has a longstanding history of bipolar disorder, not on medications, and has been decompensating over the past couple of weeks.  Became manic around election time (11/5) and has made an egregious number of hyperreligious/manic statements on Facebook (that God is speaking to her, that [name] is coming", expressed paranoid thinking, disorganized thinking, reduced sleep, disorganized speech, inability to care for self.  A friend visited patient's house, and found in disarray, which is unusual for patient.  Similar episode in 2017 following [place]inda [place] Trump's first electoral victory Lexicographer of ideas/hallucinations/erratic behavior) requiring [facility] admission.  At that time she was diagnosed with bipolar disorder, severe mixed episode without psychotic features.  Resolved with Seroquel and Lamictal, both of which patient discontinued over the course of the next year.  Lost to follow-up later that year.  In the interim, patient has not taken  prescribed medication for bipolar disorder and has seen no psychiatrist.   On interview, patient is a poor historian - overly bright, tangential, and hyperverbal but attentive to conversation.  Patient says that she has been "overwhelmed with information" over the past few days since an argument with her friend.  She said that she had made a number of Facebook posts after drinking (the content of which she cannot remember).  Her family expressed concern over these posts, which puzzled her-she attributes their concern to her use of "extra emojis".  Patient is difficult to keep on track.  Was told that she has a diagnosis of bipolar disorder, but does not take any medications except supplements and a multivitamin.     Patient attests that sleep and appetite are normal. (Per chart review, patient has not been sleeping or eating.) denies SI/HI.  When asked about visual hallucinations, patient endorses reading "body language".  Also endorses some paranoid/suspicious thinking, but is unable to coherently explain what she means by this. When asked what her goals are for at this hospital stay she said "to learn a new language" of different symbols, as well as body language.  Denies feeling depressed recently, except in response to Trump's recent re-election.  Is not overly anxious at baseline.  Endorsed history of trauma - positive for the following PTSD symptoms: prior stressor, intrusions, hyperarousal, and nightmares.  At one point became tearful during the interview, and discussed previous relationship with her ex-husband and the loss of an early pregnancy.  Endorses recent alcohol use, last use ~ 11/17, 2-2.5 glasses of wine per day.  (Per chart review, patient had consumed up to 3 bottles a day in the past -- patient references seeking out community support in Al-Anon/lighthouse programs.) Denied other  substance use.     Past Medical History: History reviewed. No pertinent past medical history.  Past Surgical  History:  Procedure Laterality Date   CESAREAN SECTION     Family History:  Family History  Problem Relation Age of Onset   Alcohol abuse Mother    Bipolar disorder Mother    Alcohol abuse Father    Depression Sister    Bipolar disorder Sister    Breast cancer Neg Hx    Family Psychiatric History: Psychiatric diagnoses:    Mother: Nervous breakdown requiring hospitalization Sister: Bipolar disorder Sister: Depression Father: Alcoholism   Suicide history: none.  Violence/aggression: none. Substance use history:  EtOH-dad. Social History:  Social History   Substance and Sexual Activity  Alcohol Use No   Alcohol/week: 0.0 standard drinks of alcohol     Social History   Substance and Sexual Activity  Drug Use No    Social History   Socioeconomic History   Marital status: Single    Spouse name: Not on file   Number of children: Not on file   Years of education: Not on file   Highest education level: Not on file  Occupational History   Not on file  Tobacco Use   Smoking status: Never   Smokeless tobacco: Never  Substance and Sexual Activity   Alcohol use: No    Alcohol/week: 0.0 standard drinks of alcohol   Drug use: No   Sexual activity: Never    Birth control/protection: Post-menopausal  Other Topics Concern   Not on file  Social History Narrative   Not on file   Social Determinants of Health   Financial Resource Strain: Not on file  Food Insecurity: No Food Insecurity (09/07/2023)   Hunger Vital Sign    Worried About Running Out of Food in the Last Year: Never true    Ran Out of Food in the Last Year: Never true  Transportation Needs: No Transportation Needs (09/07/2023)   PRAPARE - Administrator, Civil Service (Medical): No    Lack of Transportation (Non-Medical): No  Physical Activity: Not on file  Stress: Not on file  Social Connections: Not on file    Hospital Course:  During the patient's hospitalization, patient had extensive  initial psychiatric evaluation, and follow-up psychiatric evaluations every day.  Psychiatric diagnoses provided upon initial assessment: Bipolar I Disorder  Patient's psychiatric medications were adjusted on admission: Olanzapine 5 mg at bedtime, hydroxyzine was added as thrice daily PRN. Trazodone was added as an as-needed for sleep.  During the hospitalization, other adjustments were made to the patient's psychiatric medication regimen:   - Olanzapine titrated up to 15 mg at bedtime   - divalproex was started, then increased gradually up to 750 mg at bedtime  Patient's care was discussed during the interdisciplinary team meeting every day during the hospitalization.  The patient denies having side effects to prescribed psychiatric medication.  Gradually, patient started adjusting to milieu. The patient was evaluated each day by a clinical provider to ascertain response to treatment. Improvement was noted by the patient's report of decreasing symptoms, improved sleep and appetite, affect, medication tolerance, behavior, and participation in unit programming.  Patient was asked each day to complete a self inventory noting mood, mental status, pain, new symptoms, anxiety and concerns.   Symptoms were reported as significantly decreased or resolved completely by discharge.  The patient reports that their mood is stable.  The patient denied having suicidal thoughts for more than 48 hours  prior to discharge.  Patient denies having homicidal thoughts.  Patient denies having auditory hallucinations.  Patient denies any visual hallucinations or other symptoms of psychosis.  The patient was motivated to continue taking medication with a goal of continued improvement in mental health.   The patient reports their target psychiatric symptoms of acute mania responded well to the psychiatric medications, and the patient reports overall benefit other psychiatric hospitalization. Supportive psychotherapy was  provided to the patient. The patient also participated in regular group therapy while hospitalized. Coping skills, problem solving as well as relaxation therapies were also part of the unit programming.  Labs were reviewed with the patient, and abnormal results were discussed with the patient.  The patient is able to verbalize their individual safety plan to this provider.  # It is recommended to the patient to continue psychiatric medications as prescribed, after discharge from the hospital.    # It is recommended to the patient to follow up with your outpatient psychiatric provider and PCP.  # It was discussed with the patient, the impact of alcohol, drugs, tobacco have been there overall psychiatric and medical wellbeing, and total abstinence from substance use was recommended the patient.ed.  # Prescriptions provided or sent directly to preferred pharmacy at discharge. Patient agreeable to plan. Given opportunity to ask questions. Appears to feel comfortable with discharge.    # In the event of worsening symptoms, the patient is instructed to call the crisis hotline, 911 and or go to the nearest ED for appropriate evaluation and treatment of symptoms. To follow-up with primary care provider for other medical issues, concerns and or health care needs  # Patient was discharged home with a plan to follow up as noted below.    On day of discharge, the patient was evaluated by the house officer and also by the attending physician. The patient was calm, polite, and cooperative during all parts of the exam. She voiced understanding of all parts of her medication regimen and voiced pleasure at the opportunity to return home to her dogs and social life.    Physical Findings: AIMS:  , ,  ,  ,    CIWA:    COWS:     Musculoskeletal: Strength & Muscle Tone: within normal limits Gait & Station: normal Patient leans: N/A   Psychiatric Specialty Exam:  Presentation  General Appearance:   Appropriate for Environment; Well Groomed  Eye Contact: Good  Speech: Clear and Coherent  Speech Volume: Normal  Handedness: Right   Mood and Affect  Mood: Euphoric; Euthymic  Affect: Congruent; Appropriate   Thought Process  Thought Processes: Coherent; Goal Directed; Linear  Descriptions of Associations:Intact  Orientation:Full (Time, Place and Person)  Thought Content:Abstract Reasoning; Logical  History of Schizophrenia/Schizoaffective disorder:No  Duration of Psychotic Symptoms:N/A  Hallucinations:Hallucinations: None  Ideas of Reference:None  Suicidal Thoughts:Suicidal Thoughts: No  Homicidal Thoughts:Homicidal Thoughts: No   Sensorium  Memory: Immediate Good; Recent Good; Remote Good  Judgment: Good  Insight: Fair   Art therapist  Concentration: Good  Attention Span: Good  Recall: Good  Fund of Knowledge: Good  Language: Good   Psychomotor Activity  Psychomotor Activity: Psychomotor Activity: Normal   Assets  Assets: Desire for Improvement; Communication Skills; Leisure Time; Physical Health; Social Support; Health and safety inspector; Housing; Transportation; Vocational/Educational   Sleep  Sleep: Sleep: Good Number of Hours of Sleep: 8.5    Physical Exam: Vitals and nursing note reviewed.  Constitutional:      General: She is not in  acute distress.    Appearance: Normal appearance. She is not ill-appearing.  HENT:     Head: Normocephalic and atraumatic.     Nose: Nose normal.  Eyes:     Extraocular Movements: Extraocular movements intact.     Pupils: Pupils are equal, round, and reactive to light.  Pulmonary:     Effort: Pulmonary effort is normal.  Skin:    General: Skin is warm and dry.  Neurological:     Mental Status: She is alert and oriented to person, place, and time. Mental status is at baseline.  Psychiatric:        Mood and Affect: Mood normal.        Behavior: Behavior normal.         Thought Content: Thought content normal.        Judgment: Judgment normal.   Review of Systems  Constitutional:  Negative for chills, fever and weight loss.  Constitutional:  Negative for chills, fever, malaise/fatigue and weight loss.  HENT: Negative.    Eyes: Negative.   Respiratory:  Negative for cough.   Cardiovascular:  Negative for chest pain and palpitations.  Gastrointestinal:  Negative for abdominal pain, constipation, diarrhea, heartburn, nausea and vomiting.  Genitourinary: Negative.   Musculoskeletal: Negative.   Skin:  Negative for itching and rash.  Neurological:  Negative for headaches.  Psychiatric/Behavioral:  Negative for depression, hallucinations, memory loss, substance abuse and suicidal ideas. The patient is nervous/anxious. The patient does not have insomnia. Blood pressure 112/72, pulse 88, temperature 98 F (36.7 C), temperature source Oral, resp. rate 16, height 5\' 3"  (1.6 m), weight 56.2 kg, SpO2 100%. Body mass index is 21.97 kg/m.   Social History   Tobacco Use  Smoking Status Never  Smokeless Tobacco Never   Tobacco Cessation:  N/A, patient does not currently use tobacco products   Blood Alcohol level:  Lab Results  Component Value Date   ETH <10 09/06/2023   ETH 159 (H) 11/20/2015    Metabolic Disorder Labs:  Lab Results  Component Value Date   HGBA1C 5.3 09/06/2023   MPG 105.41 09/06/2023   MPG 108 11/22/2015   Lab Results  Component Value Date   PROLACTIN 14.9 11/22/2015   Lab Results  Component Value Date   CHOL 198 09/06/2023   TRIG 29 09/06/2023   HDL 107 09/06/2023   CHOLHDL 1.9 09/06/2023   VLDL 6 09/06/2023   LDLCALC 85 09/06/2023   LDLCALC 101 (H) 11/22/2015    See Psychiatric Specialty Exam and Suicide Risk Assessment completed by Attending Physician prior to discharge.  Discharge destination:  Home  Is patient on multiple antipsychotic therapies at discharge:  No   Has Patient had three or more failed  trials of antipsychotic monotherapy by history:  No  Recommended Plan for Multiple Antipsychotic Therapies: NA  Discharge Instructions     Diet - low sodium heart healthy   Complete by: As directed    Discharge instructions   Complete by: As directed    It has been a pleasure taking care of you. Here are your instructions: Activity: as tolerated Diet: heart healthy Other: -Follow-up with your outpatient psychiatric provider -instructions on appointment date, time, and address (location) are provided to you in discharge paperwork.  -Take your psychiatric medications as prescribed at discharge - instructions are provided to you in the discharge paperwork  -Follow-up with outpatient primary care doctor and other specialists -for management of preventative medicine and chronic medical disease  -Testing: Follow-up with  outpatient provider for abnormal lab results: low valproic acid level.  -If you are prescribed an atypical antipsychotic medication, we recommend that your outpatient psychiatrist follow routine screening for side effects within 3 months of discharge, including monitoring: AIMS scale, height, weight, blood pressure, fasting lipid panel, HbA1c, and fasting blood sugar.   -Recommend total abstinence from alcohol, tobacco, and other illicit drug use at discharge.   -If your psychiatric symptoms recur, worsen, or if you have side effects to your psychiatric medications, call your outpatient psychiatric provider, 911, 988 or go to the nearest emergency department.  -If suicidal thoughts occur, immediately call your outpatient psychiatric provider, 911, 988 or go to the nearest emergency department.    Outpatient Therapy and Psychiatry Resources for Patients in [facility]:     Includes links to the following:  [facility] Urgent Care (http://wilson-mayo.[email or web address]/)  [facility] [name] (http://blankenship-martinez.net/)  Psychology Today Therapist Finder (https://www.psychologytoday.com/us/therapists)  Psychology Today Support Group Finder (https://www.psychologytoday.com/us/groups/)  [facility] Location (https://carolinabehavioralcare.com/staff-location/[place]/)  [facility] [place] - Support Group Finder - (RecordDebt.fi)  [facility] [employer] - [place] (https://fspcares.org/contact/)  [employer] [facility] [place] - [facility] (https://namiguilford.org/support-and-education/support-groups/)  Interior and spatial designer Health - Affiliated with [facility] (https://www.Roberts.com/lb/locations/profile/[facility]-behavioral-medicine-at[facility]/)  Dept of Health and Human Services - Find a mental health facility (http://lester.info/)  For more information about bipolar disorder: http://ryan-bowers.net/   Increase activity slowly   Complete by: As directed       Allergies as of 09/20/2023   No Known Allergies      Medication List     STOP taking these medications    multivitamin with minerals Tabs tablet       TAKE these medications      Indication  B-complex with vitamin C tablet Take 1 tablet by mouth daily.  Indication: Nutritional Support   calcium carbonate 1250 (500 Ca) MG tablet Commonly known as: OS-CAL - dosed in mg of elemental calcium Take 1 tablet by mouth daily with breakfast.  Indication: Heartburn   cholecalciferol 1000 units tablet Commonly known as: VITAMIN D Take 1,000 Units by mouth daily.  Indication: Vitamin D Deficiency   COLLAGEN PO Take 1 Units by mouth daily.  Indication: Nutritional Support   divalproex 250 MG 24 hr tablet Commonly known as: DEPAKOTE  ER Take 3 tablets (750 mg total) by mouth at bedtime.  Indication: Manic Phase of Manic-Depression   hydrOXYzine 25 MG tablet Commonly known as: ATARAX Take 1 tablet (25 mg total) by mouth 3 (three) times daily as needed for anxiety.  Indication: Feeling Anxious, Feeling Tense   magnesium oxide 400 (240 Mg) MG tablet Commonly known as: MAG-OX Take 400 mg by mouth daily.  Indication: Acid Indigestion, Chronic Constipation of Unknown Cause   OLANZapine 15 MG tablet Commonly known as: ZYPREXA Take 1 tablet (15 mg total) by mouth at bedtime.  Indication: Manic Phase of Manic-Depression   omega-3 acid ethyl esters 1 g capsule Commonly known as: LOVAZA Take 1 g by mouth daily.  Indication: High Amount of Triglycerides in the Blood   traZODone 50 MG tablet Commonly known as: DESYREL Take 1 tablet (50 mg total) by mouth at bedtime as needed for sleep.  Indication: Trouble Sleeping        Follow-up Information     [facility] Go on 09/28/2023.   Why: You have an appointment for medication management services on  09/28/23 at 2:00 pm.  The  appointment will be held in person. Contact information: [address] [place] [address] [phone]         [facility] [place], [place]. Go on 09/22/2023.   Why: Agency Known as [facility]; Patient has been scheduled for Intensive Outpatient follow up with this agency on Thursday 09/22/2023 with [facility]. Agency will reach out to patient to establish most appropriate time. Patient will be seen by therapist once a week with included group therapy. Contact information: [address] [place] [address] [phone]                 Follow-up recommendations:   Activity: as tolerated   Diet: heart healthy   Other: -Follow-up with your outpatient psychiatric provider -instructions on appointment date, time, and address (location) are provided to you in discharge paperwork.    -Take your psychiatric medications as prescribed at discharge - instructions are provided to you in the discharge paperwork   -Follow-up with outpatient primary care doctor and other specialists -for management of preventative medicine and chronic medical disease   -Testing: Follow-up with outpatient provider for abnormal lab results: low valproic acid level.   -If you are prescribed an atypical antipsychotic medication, we recommend that your outpatient psychiatrist follow routine screening for side effects within 3 months of discharge, including monitoring: AIMS scale, height, weight, blood pressure, fasting lipid panel, HbA1c, and fasting blood sugar.    -Recommend total abstinence from alcohol, tobacco, and other illicit drug use at discharge.    -If your psychiatric symptoms recur, worsen, or if you have side effects to your psychiatric medications, call your outpatient psychiatric provider, 911, 988 or go to the nearest emergency department.   -If suicidal thoughts occur, immediately call your outpatient psychiatric provider, 911, 988 or go to the nearest emergency department.      Outpatient Therapy and Psychiatry Resources for Patients in [facility]:      Includes links to the following: [facility] Urgent Care (http://wilson-mayo.com/) [facility] [place] (http://blankenship-martinez.net/) Psychology Today Therapist Finder (https://www.psychologytoday.com/us/therapists) Psychology Today Support Group Finder (https://www.psychologytoday.com/us/groups/) [facility] Location (https://carolinabehavioralcare.com/staff-location/[place]/) [facility] [place] - Support Group Finder - (RecordDebt.fi) [facility] [employer] - [place]icographer  (https://fspcares.org/contact/) [employer] [facility] [place] - [facility] (https://namiguilford.org/support-and-education/support-groups/) Interior and spatial designer Health - Affiliated with [facility] (https://www.[place].com/lb/locations/profile/[facility]-behavioral-medicine-at-[address]/) Dept of Health and Human Services - Find a mental health facility (http://lester.info/)   For more information about bipolar disorder: http://ryan-bowers.net/   Signed: Margaretmary Dys, MD 09/20/2023, 3:13 PM

## 2023-09-20 NOTE — Group Note (Signed)
LCSW Group Therapy Note  Group Date: 09/20/2023 Start Time: 1110 End Time: 1210   Type of Therapy and Topic:  Group Therapy - Healthy vs Unhealthy Coping Skills  Participation Level:  Active   Description of Group The focus of this group was to determine what unhealthy coping techniques typically are used by group members and what healthy coping techniques would be helpful in coping with various problems. Patients were guided in becoming aware of the differences between healthy and unhealthy coping techniques. Patients were asked to identify 2-3 healthy coping skills they would like to learn to use more effectively.  Therapeutic Goals Patients learned that coping is what human beings do all day long to deal with various situations in their lives Patients defined and discussed healthy vs unhealthy coping techniques Patients identified their preferred coping techniques and identified whether these were healthy or unhealthy Patients determined 2-3 healthy coping skills they would like to become more familiar with and use more often. Patients provided support and ideas to each other   Summary of Patient Progress:  During group, patient expressed that she knows she has to continue working on accept things that she cannot change. Patient reports she is only able to focus on herself and reports an interest in centering herself again with God or her higher power. Patient proved open to input from peers and feedback from CSW. Patient demonstrated good insight into the subject matter, was respectful of peers, and participated throughout the entire session.   Therapeutic Modalities Cognitive Behavioral Therapy Motivational Interviewing  Loleta Dicker, [place] 09/20/2023  2:28 PM
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
## 2023-09-20 NOTE — Group Note (Signed)
Recreation Therapy Group Note   Group Topic:Animal Assisted Therapy   Group Date: 09/20/2023 Start Time: 9604 End Time: 1033 Facilitators: Nnamdi Dacus-McCall, LRT,CTRS Location: [address]   Animal-Assisted Activity (AAA) Program Checklist/Progress Notes Patient Eligibility Criteria Checklist & Daily Group note for Rec Tx Intervention  AAA/T Program Assumption of Risk Form signed by Patient/ or Parent Legal Guardian Yes  Patient is free of allergies or severe asthma Yes  Patient reports no fear of animals Yes  Patient reports no history of cruelty to animals Yes  Patient understands his/her participation is voluntary Yes  Patient washes hands before animal contact Yes  Patient washes hands after animal contact Yes  Education: Hand Washing, Appropriate Animal Interaction   Education Outcome: Acknowledges education.    Affect/Mood: Appropriate   Participation Level: Engaged   Participation Quality: Independent   Behavior: Appropriate   Speech/Thought Process: Focused   Insight: Good   Judgement: Good   Modes of Intervention: Teaching laboratory technician   Patient Response to Interventions:  Engaged   Education Outcome:  In group clarification offered    Clinical Observations/Individualized Feedback: Patient attended session and interacted appropriately with therapy dog and peers. Patient asked appropriate questions about therapy dog and his training. Patient shared stories about their pets at home with group.      Plan: Continue to engage patient in RT group sessions 2-3x/week.   Malachai Schalk-McCall, LRT,CTRS 09/20/2023 12:30 PM

## 2023-09-20 NOTE — Discharge Instructions (Addendum)
Activity: as tolerated  Diet: heart healthy  Other: -Follow-up with your outpatient psychiatric provider -instructions on appointment date, time, and address (location) are provided to you in discharge paperwork.  -Take your psychiatric medications as prescribed at discharge - instructions are provided to you in the discharge paperwork  -Follow-up with outpatient primary care doctor and other specialists -for management of preventative medicine and chronic medical disease  -Testing: Follow-up with outpatient provider for abnormal lab results: low valproic acid level.  -If you are prescribed an atypical antipsychotic medication, we recommend that your outpatient psychiatrist follow routine screening for side effects within 3 months of discharge, including monitoring: AIMS scale, height, weight, blood pressure, fasting lipid panel, HbA1c, and fasting blood sugar.   -Recommend total abstinence from alcohol, tobacco, and other illicit drug use at discharge.   -If your psychiatric symptoms recur, worsen, or if you have side effects to your psychiatric medications, call your outpatient psychiatric provider, 911, [phone] or go to the nearest emergency department.  -If suicidal thoughts occur, immediately call your outpatient psychiatric provider, 911, [phone] or go to the nearest emergency department.    Outpatient Therapy and Psychiatry Resources for Patients in [facility]:    Includes links to the following: [facility] (http://wilson-mayo.com/) [facility] [place] (http://blankenship-martinez.net/) [facility] Therapist Finder (https://www.psychologytoday.com/us/therapists) [facility] Support Group Finder (https://www.psychologytoday.com/us/groups/) [facility] Location  (https://carolinabehavioralcare.com/staff-location/[place]/) [facility] - Support Group Finder - (RecordDebt.fi) [facility] [employer] [facility] (https://fspcares.org/contact/) [employer] [facility] [place] [facility] (https://namiguilford.org/support-and-education/support-groups/) Interior and spatial designer Health - [facility] with [facility] (https://www.[place].com/lb/locations/profile/[facility]-behavioral-medicine-at-[address]/) Dept of Health and Human Services - Find a mental health facility (http://lester.info/)  For more information about bipolar disorder: http://ryan-bowers.net/

## 2023-09-22 ENCOUNTER — Ambulatory Visit: Payer: PPO

## 2023-09-22 ENCOUNTER — Ambulatory Visit
Admission: RE | Admit: 2023-09-22 | Discharge: 2023-09-22 | Disposition: A | Discharge status: Home / Self Care | Payer: PPO | Source: Ambulatory Visit | Attending: Family Medicine | Admitting: Family Medicine

## 2023-09-22 DIAGNOSIS — Z1231 Encounter for screening mammogram for malignant neoplasm of breast: Secondary | ICD-10-CM | POA: Diagnosis not present

## 2023-09-27 ENCOUNTER — Ambulatory Visit (INDEPENDENT_AMBULATORY_CARE_PROVIDER_SITE_OTHER): Payer: PPO | Admitting: Licensed Clinical Social Worker

## 2023-09-27 DIAGNOSIS — Z538 Procedure and treatment not carried out for other reasons: Secondary | ICD-10-CM

## 2023-09-27 NOTE — Progress Notes (Signed)
Toma Aran. Spott is a 67 year old Caucasian female that presented today for in-person comprehensive clinical assessment.  Barri presented for session on time and was alert, oriented x5, with no evidence or self-report of active SI/HI or A/V H.  Karalyn reported compliance with current medication regimen.  Sherianne denied any current abuse of alcohol or illicit substances.  Eleana reported that she was recently released from the behavioral hospital following a 2 week stay and was referred to this provider to begin individual therapy, but she was confused because she has already linked with another facility for therapy.  Clinician inquired about details of this program/referral.  Shradha reported that she started a 25 day outpatient program with [facility] 2 days ago, stating "Its going very well".  She reported that prior to program completion, they will be linking her with an individual therapist and psychiatrist covered by her insurance.  Keairra reported that she was apprehensive about proceeding with assessment today due to worries of insurance coverage and billing, as she is currently paying out of pocket for [place].  Clinician was agreeable with cancellation of today's appointment due to Jamell's existing linkage to appropriate level of care, and need to avoid any additional financial burden.  Clinician encouraged Tymisha to outreach our office again if necessary should she encounter issues setting up an initial appointment with a therapist or psychiatrist prior to completion of current outpatient program.    Noralee Stain, LCSW, LCAS 09/27/23

## 2023-09-28 DIAGNOSIS — F3181 Bipolar II disorder: Secondary | ICD-10-CM | POA: Diagnosis not present

## 2023-09-28 DIAGNOSIS — F413 Other mixed anxiety disorders: Secondary | ICD-10-CM | POA: Diagnosis not present

## 2023-09-28 DIAGNOSIS — F1994 Other psychoactive substance use, unspecified with psychoactive substance-induced mood disorder: Secondary | ICD-10-CM | POA: Diagnosis not present

## 2023-10-13 ENCOUNTER — Telehealth (HOSPITAL_COMMUNITY): Payer: Self-pay | Admitting: *Deleted

## 2023-10-13 NOTE — Telephone Encounter (Signed)
Fax received from [facility] asking for 90 day prescription of Trazodone, Hydroxyzine, and Olanzapine.

## 2023-11-07 ENCOUNTER — Ambulatory Visit (INDEPENDENT_AMBULATORY_CARE_PROVIDER_SITE_OTHER): Payer: PPO | Admitting: Licensed Clinical Social Worker

## 2023-11-07 DIAGNOSIS — F3173 Bipolar disorder, in partial remission, most recent episode manic: Secondary | ICD-10-CM

## 2023-11-07 DIAGNOSIS — F411 Generalized anxiety disorder: Secondary | ICD-10-CM
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
## 2023-12-07 ENCOUNTER — Ambulatory Visit (INDEPENDENT_AMBULATORY_CARE_PROVIDER_SITE_OTHER): Payer: PPO | Admitting: Licensed Clinical Social Worker

## 2023-12-07 ENCOUNTER — Ambulatory Visit
Admission: RE | Admit: 2023-12-07 | Discharge: 2023-12-07 | Disposition: A | Discharge status: Home / Self Care | Payer: PPO | Source: Ambulatory Visit | Attending: Family Medicine | Admitting: Family Medicine

## 2023-12-07 DIAGNOSIS — F411 Generalized anxiety disorder: Secondary | ICD-10-CM

## 2023-12-07 DIAGNOSIS — F3173 Bipolar disorder, in partial remission, most recent episode manic: Secondary | ICD-10-CM

## 2023-12-07 DIAGNOSIS — M8589 Other specified disorders of bone density and structure, multiple sites: Secondary | ICD-10-CM

## 2023-12-07 NOTE — Progress Notes (Signed)
Virtual Visit via Video Note   I connected with Michelle Levy. Michelle Levy on 12/07/23 at 1:00pm by video enabled telemedicine application and verified that I am speaking with the correct person using two identifiers.   I discussed the limitations, risks, security and privacy concerns of performing an evaluation and management service by video and the availability of in person appointments. I also discussed with the patient that there may be a patient responsible charge related to this service. The patient expressed understanding and agreed to proceed.   I discussed the assessment and treatment plan with the patient. The patient was provided an opportunity to ask questions and all were answered. The patient agreed with the plan and demonstrated an understanding of the instructions.   The patient was advised to call back or seek an in-person evaluation if the symptoms worsen or if the condition fails to improve as anticipated.   I provided 1 hour of non-face-to-face time during this encounter.     Noralee Stain, LCSW, LCAS ____________________________  THERAPIST PROGRESS NOTE   Session Time:  1:00pm - 2:00pm    Location: Patient: Patient home Provider: Home Office   Participation Level: Active    Behavioral Response: Alert, casually dressed, depressed mood/affect   Type of Therapy:  Individual Therapy   Treatment Goals addressed: Mood management; Psychiatry followup; Medication management; Maintaining healthier boundaries with network   Progress Towards Goals: Progressing   Interventions: CBT: psychoeducation on healthy boundaries    Summary: Michelle Levy is a 68 year old divorced Caucasian female that presented today with diagnoses of Bipolar I Disorder, in partial remission, most recent episode manic; and Generalized Anxiety Disorder.          Suicidal/Homicidal: None; without intent or plan.     Therapist Response:  Clinician met with Michelle Levy today for virtual therapy session and assessed  for safety, medication compliance, and sobriety.  Michelle Levy presented for appointment on time and was alert, oriented x5, with no evidence or self-report of active SI/HI or A/V H.  Michelle Levy denied any abuse of alcohol or illicit substances. Michelle Levy reported that she did have one glass of wine last night, but has greatly limited drinking, and will go several days at a time abstaining.  [name] reported compliance with all medication.  Clinician inquired about Michelle Levy's emotional ratings today, as well as any significant changes in thoughts, feelings or behavior since last check-in.  [name] reported scores of 3/10 for depression, 2/10 for anxiety, and 0/10 for anger/irritability.  Michelle Levy denied any recent panic attacks or outbursts.  Michelle Levy reported that a recent success has been keeping up with her medication and appointments with doctor, noting that mood seems to be improving.  She reported that a struggle has been dealing with a friend that "Put the friendship on hold" after a disagreement following her recent hospitalization. Clinician provided psychoeducation on subject of boundaries with [name] today using a handout.  This handout defined boundaries as the limits and rules that we set for ourselves within relationships, and featured a breakdown of the 3 common categories of boundaries (i.e. porous, rigid, and healthy), along with typical traits specific to each one for easy identification.  It was noted that most people have a mixture of different boundary types depending on setting, person, and culture.  Clinician tasked Michelle Levy with identifying which types of boundaries she presently has within her own support system, the collective impact these boundaries have upon her mental health, and changes that could be made in order to  more effectively communicate her mental health needs.  Intervention was effective, as evidenced by Michelle Levy actively engaging in discussion on subject, reporting that she used to have very porous boundaries  with many people in her life that took advantage of her kindness.  Michelle Levy reported that she is being more mindful about who she spends time with, and opens up about her struggles with.  She reported that she can have healthy boundaries with family, some friends met through recovery, and a new church she has begun to attend.  Michelle Levy reported that she is careful about developing codependency, as this has been a problem in the past, and caused many interpersonal issues.  Michelle Levy stated "I'm going to make better choices for my mental health".  Clinician will continue to monitor.   Plan: Follow up in 2 weeks.   Diagnosis: Bipolar I Disorder, in partial remission, most recent episode manic; and Generalized Anxiety Disorder.  Collaboration of Care:   No collaboration of care required for this visit.                                                   Patient/Guardian was advised Release of Information must be obtained prior to any record release in order to collaborate their care with an outside provider. Patient/Guardian was advised if they have not already done so to contact the registration department to sign all necessary forms in order for [place] to release information regarding their care.    Consent: Patient/Guardian gives verbal consent for treatment and assignment of benefits for services provided during this visit. Patient/Guardian expressed understanding and agreed to proceed.   Noralee Stain, LCSW, LCAS 12/07/23

## 2024-01-10 ENCOUNTER — Ambulatory Visit (HOSPITAL_COMMUNITY): Payer: PPO | Admitting: Licensed Clinical Social Worker

## 2024-01-18 DIAGNOSIS — F1994 Other psychoactive substance use, unspecified with psychoactive substance-induced mood disorder: Secondary | ICD-10-CM | POA: Diagnosis not present

## 2024-01-18 DIAGNOSIS — F3181 Bipolar II disorder: Secondary | ICD-10-CM | POA: Diagnosis not present

## 2024-01-18 DIAGNOSIS — F413 Other mixed anxiety disorders: Secondary | ICD-10-CM | POA: Diagnosis not present

## 2024-01-25 ENCOUNTER — Ambulatory Visit (INDEPENDENT_AMBULATORY_CARE_PROVIDER_SITE_OTHER): Payer: PPO | Admitting: Licensed Clinical Social Worker

## 2024-01-25 DIAGNOSIS — F3173 Bipolar disorder, in partial remission, most recent episode manic: Secondary | ICD-10-CM

## 2024-01-25 DIAGNOSIS — F411 Generalized anxiety disorder: Secondary | ICD-10-CM

## 2024-01-25 DIAGNOSIS — Z538 Procedure and treatment not carried out for other reasons: Secondary | ICD-10-CM

## 2024-01-25 NOTE — Progress Notes (Signed)
 Virtual Visit via Video Note   I connected with Michelle Levy on 01/25/24 at 2:00pm by video enabled telemedicine application and verified that I am speaking with the correct person using two identifiers.   I discussed the limitations, risks, security and privacy concerns of performing an evaluation and management service by video and the availability of in person appointments. I also discussed with the patient that there may be a patient responsible charge related to this service. The patient expressed understanding and agreed to proceed.   I discussed the assessment and treatment plan with the patient. The patient was provided an opportunity to ask questions and all were answered. The patient agreed with the plan and demonstrated an understanding of the instructions.   The patient was advised to call back or seek an in-person evaluation if the symptoms worsen or if the condition fails to improve as anticipated.   I provided 1 hour of non-face-to-face time during this encounter.     Noralee Stain, LCSW, LCAS ____________________________  THERAPIST PROGRESS NOTE   Session Time: 2:00pm  - 3:00pm  Location: Patient: Patient home Provider: Home Office   Participation Level: Active    Behavioral Response: Alert, casually dressed, anxious mood/affect   Type of Therapy:  Individual Therapy   Treatment Goals addressed: Mood management; Psychiatry followup; Medication management; Maintaining healthier boundaries with network   Progress Towards Goals: Progressing   Interventions: CBT, communication skills, problem solving    Summary: Michelle Levy is a 68 year old divorced Caucasian female that presented today with diagnoses of Bipolar I Disorder, in partial remission, most recent episode manic; and Generalized Anxiety Disorder.          Suicidal/Homicidal: None; without intent or plan.     Therapist Response:  Clinician met with Michelle Levy today for virtual therapy appointment and assessed  for safety, medication compliance, and sobriety.  Michelle Levy presented for session on time and was alert, oriented x5, with no evidence or self-report of active SI/HI or A/V H.  Michelle Levy denied any abuse of alcohol or illicit substances. Michelle Levy reported that she has been limiting herself to "1 cocktail in the evenings".  Michelle Levy reported compliance with all medication.  Clinician inquired about Michelle Levy's current emotional ratings, as well as any significant changes in thoughts, feelings or behavior since previous check-in.  Mairead reported scores of 4/10 for depression, 6/10 for anxiety, and 4/10 for anger/irritability.  Jayra denied any recent panic attacks or outbursts.  Trenell reported that a recent struggle has been dealing with work stress.  She reported that she needs to have a difficult conversation with a customer today, but worries she will back down, stating "People can be very demanding, and I'm soft spoken". Clinician reviewed material with Michelle Levy on cultivating assertive communication skills so that she can speak up for herself more effectively, including differentiation between the different styles (I.e. passive, aggressive, and assertive) and characteristics of each.  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  Clinician also offered roleplay practice in session to reinforce these skills across relevant scenarios.  Intervention was effective, as evidenced by Jozelynn's active engagement in discussion on subject, reporting that she has a passive communication style due to traits such as being soft spoken or quiet, not expressing her own needs or wants, prioritizing the needs of others and lacking confidence.  She reported that her passive demeanor has led to several consequences,  including not speaking up in her previous marriage, and having trouble resolving conflict at her job with staff or customers.   Michelle Levy showed improved assertive communication through engagement in roleplay activities.  Michelle Levy stated "I will speak up for myself".  Clinician will continue to monitor.   Plan: Follow up in 2 weeks.   Diagnosis: Bipolar I Disorder, in partial remission, most recent episode manic; and Generalized Anxiety Disorder.  Collaboration of Care:   No collaboration of care required for this visit.                                                   Patient/Guardian was advised Release of Information must be obtained prior to any record release in order to collaborate their care with an outside provider. Patient/Guardian was advised if they have not already done so to contact the registration department to sign all necessary forms in order for [place] to release information regarding their care.    Consent: Patient/Guardian gives verbal consent for treatment and assignment of benefits for services provided during this visit. Patient/Guardian expressed understanding and agreed to proceed.   Noralee Stain, LCSW, LCAS 01/25/24

## 2024-02-08 ENCOUNTER — Ambulatory Visit (HOSPITAL_COMMUNITY): Payer: PPO | Admitting: Licensed Clinical Social Worker

## 2024-02-08 DIAGNOSIS — F3173 Bipolar disorder, in partial remission, most recent episode manic: Secondary | ICD-10-CM

## 2024-02-08 DIAGNOSIS — F411 Generalized anxiety disorder: Secondary | ICD-10-CM

## 2024-02-08 NOTE — Progress Notes (Signed)
 Virtual Visit via Video Note   I connected with Michelle Levy on 02/08/24 at 2:00pm by video enabled telemedicine application and verified that I am speaking with the correct person using two identifiers.   I discussed the limitations, risks, security and privacy concerns of performing an evaluation and management service by video and the availability of in person appointments. I also discussed with the patient that there may be a patient responsible charge related to this service. The patient expressed understanding and agreed to proceed.   I discussed the assessment and treatment plan with the patient. The patient was provided an opportunity to ask questions and all were answered. The patient agreed with the plan and demonstrated an understanding of the instructions.   The patient was advised to call back or seek an in-person evaluation if the symptoms worsen or if the condition fails to improve as anticipated.   I provided 45 minutes of non-face-to-face time during this encounter.     Desmond [place], LCSW, LCAS ____________________________  THERAPIST PROGRESS NOTE   Session Time: 2:00pm - 2:45pm   Location: Patient: Patient home Provider: [facility]   Participation Level: Active    Behavioral Response: Alert, casually dressed, depressed mood/affect   Type of Therapy:  Individual Therapy   Treatment Goals addressed: Mood management; Psychiatry followup; Medication management  Progress Towards Goals: Progressing   Interventions: CBT: self-care assessment    Summary: Michelle Levy is a 68 year old divorced Caucasian female that presented today with diagnoses of Bipolar I Disorder, in partial remission, most recent episode manic; and Generalized Anxiety Disorder.          Suicidal/Homicidal: None; without intent or plan.     Therapist Response:  Clinician met with Michelle Levy today for virtual therapy session and assessed for safety, medication compliance, and sobriety.  Michelle Levy  presented for appointment on time and was alert, oriented x5, with no evidence or self-report of active SI/HI or A/V H.  Michelle Levy denied any abuse of alcohol or illicit substances. Michelle Levy reported compliance with all medication.  Clinician inquired about Michelle Levy emotional ratings today, as well as any significant changes in thoughts, feelings or behavior since last check-in.  Michelle Levy reported scores of 4/10 for depression, 2/10 for anxiety, and 4/10 for anger/irritability.  Michelle Levy denied any recent panic attacks or outbursts.  Kamarri reported that a recent struggle has been feeling bored, and trying to figure out ways to fill idle time now that work stress has calmed down.  Clinician introduced topic of self-care today.  Clinician explained how this can be defined as the things one does to maintain good health and improve well-being.  Clinician provided Michelle Levy with a self-care assessment form to complete.  This handout featured various sub-categories of self-care, including physical, psychological/emotional, social, spiritual, and professional.  Michelle Levy was asked to rank her engagement in the activities listed for each dimension on a scale of 1-3, with 1 indicating 'Poor', 2 indicating 'Ok', and 3 indicating 'Well'.  Clinician invited Michelle Levy to share results of her assessment, and inquired about which areas of self-care she is doing well in, as well as areas that require attention, and how she plans to begin addressing this during treatment.  Intervention was effective, as evidenced by Michelle Levy successfully completing first section (physical) of assessment and actively engaging in discussion on subject, reporting that she is excelling in areas such as eating healthy foods, taking care of personal hygiene, eating regularly, and getting enough sleep, but would benefit from  focusing more on areas such as exercising, wearing clothes that make her feel good, and participating in fun activities.  Michelle Levy reported that she would work to  improve self-care deficits by going to the gym at least once per week to improve exercise through attendance to workout classes, making changes to her wardrobe that improve self-image and curb isolation, and learning to play pickleball through friends. Michelle Levy asked to end session early due to battery running low on her phone. Clinician will continue to monitor.   Plan: Follow up in 2 weeks.   Diagnosis: Bipolar I Disorder, in partial remission, most recent episode manic; and Generalized Anxiety Disorder.  Collaboration of Care:   No collaboration of care required for this visit.                                                   Patient/Guardian was advised Release of Information must be obtained prior to any record release in order to collaborate their care with an outside provider. Patient/Guardian was advised if they have not already done so to contact the registration department to sign all necessary forms in order for us  to release information regarding their care.    Consent: Patient/Guardian gives verbal consent for treatment and assignment of benefits for services provided during this visit. Patient/Guardian expressed understanding and agreed to proceed.   Desmond [place], [place], LCAS 02/08/24

## 2024-03-13 DIAGNOSIS — H5203 Hypermetropia, bilateral: Secondary | ICD-10-CM | POA: Diagnosis not present

## 2024-03-13 DIAGNOSIS — H524 Presbyopia: Secondary | ICD-10-CM | POA: Diagnosis not present

## 2024-03-13 DIAGNOSIS — H2513 Age-related nuclear cataract, bilateral: Secondary | ICD-10-CM | POA: Diagnosis not present

## 2024-04-04 ENCOUNTER — Ambulatory Visit (INDEPENDENT_AMBULATORY_CARE_PROVIDER_SITE_OTHER): Admitting: Licensed Clinical Social Worker

## 2024-04-04 DIAGNOSIS — F3173 Bipolar disorder, in partial remission, most recent episode manic: Secondary | ICD-10-CM | POA: Diagnosis not present

## 2024-04-04 DIAGNOSIS — F411 Generalized anxiety disorder: Secondary | ICD-10-CM | POA: Diagnosis not present

## 2024-04-04 NOTE — Progress Notes (Signed)
 Virtual Visit via Video Note   I connected with Michelle Levy on 04/04/24 at 1:00pm by video enabled telemedicine application and verified that I am speaking with the correct person using two identifiers.   I discussed the limitations, risks, security and privacy concerns of performing an evaluation and management service by video and the availability of in person appointments. I also discussed with the patient that there may be a patient responsible charge related to this service. The patient expressed understanding and agreed to proceed.   I discussed the assessment and treatment plan with the patient. The patient was provided an opportunity to ask questions and all were answered. The patient agreed with the plan and demonstrated an understanding of the instructions.   The patient was advised to call back or seek an in-person evaluation if the symptoms worsen or if the condition fails to improve as anticipated.   I provided 35 minutes of non-face-to-face time during this encounter.     Desmond [place], LCSW, LCAS ____________________________  THERAPIST PROGRESS NOTE   Session Time: 1:00pm - 1:35pm    Location: Patient: Patient home Provider: [facility]   Participation Level: Active    Behavioral Response: Alert, casually dressed, anxious mood/affect   Type of Therapy:  Individual Therapy   Treatment Goals addressed: Mood management; Medication management  Progress Towards Goals: Progressing   Interventions: CBT: challenging anxious thoughts    Summary: Michelle Levy is a 68 year old divorced Caucasian female that presented today with diagnoses of Bipolar I Disorder, in partial remission, most recent episode manic; and Generalized Anxiety Disorder.          Suicidal/Homicidal: None; without intent or plan.     Therapist Response:  Clinician met with Michelle Levy today for virtual therapy appointment and assessed for safety, medication compliance, and sobriety.  Michelle Levy presented for  session on time and was alert, oriented x5, with no evidence or self-report of active SI/HI or A/V H.  Michelle Levy denied any abuse of alcohol or illicit substances. Michelle Levy reported compliance with all medication.  Clinician inquired about Michelle Levy current emotional ratings, as well as any significant changes in thoughts, feelings or behavior since previous check-in.  Michelle Levy reported scores of 4/10 for depression, 6/10 for anxiety, and 0/10 for anger/irritability.  Michelle Levy denied any recent panic attacks or outbursts.  Michelle Levy reported that a recent success has been having some work done on her house this week to improve the deck.  Michelle Levy reported that a struggle was being invited to a [place] party at the beach, and initially accepting the invitation, but then starting to worry about this decision, stating I was thinking about what could go wrong.  Clinician utilized handout in session today titled Worry exploration in order to assist Michelle Levy in reducing her anxiety related to going on this trip.  This worksheet featured a series of Socratic questions aimed at exploring the most likely outcomes for a situation of concern, rather than focusing on the worst possible outcome (i.e. catastrophizing).  Clinician assisted Michelle Levy in identifying and challenging any irrational beliefs related to this worry, in addition to utilizing problem solving approach to explore strategies which would help her accomplish goal of traveling to the beach safely and participating in the celebration with minimal distress.  Michelle Levy actively participated in discussion on handout, reporting that she is primarily worried that her car will break down on the way to or from the destination and leave her stranded.  Michelle Levy reported that there is sufficient  evidence to suggest she will be okay, including the fact that she has had to go on long trips before without any major issues occurring, she keeps her car well maintained by taking it to the shop, and she has  people to call for help if anything should go wrong.  She reported that she also plans to take her car to the shop before she leaves in order to have her tech look for any major mechanical issues to fix.  Intervention was effective, as evidenced by Michelle Levy reporting that discussion on this subject reduced her anxiety down to 4/10, and increased her confidence in her ability to go on this trip without issue.  Michelle Levy stated "The mind will lie to you.  I need to go through the things I fear".  Clinician will continue to monitor.   Plan: Follow up in 2 weeks.   Diagnosis: Bipolar I Disorder, in partial remission, most recent episode manic; and Generalized Anxiety Disorder.  Collaboration of Care:   No collaboration of care required for this visit.                                                   Patient/Guardian was advised Release of Information must be obtained prior to any record release in order to collaborate their care with an outside provider. Patient/Guardian was advised if they have not already done so to contact the registration department to sign all necessary forms in order for us  to release information regarding their care.    Consent: Patient/Guardian gives verbal consent for treatment and assignment of benefits for services provided during this visit. Patient/Guardian expressed understanding and agreed to proceed.   Desmond [place], LCSW, LCAS 04/04/24

## 2024-04-11 DIAGNOSIS — F3181 Bipolar II disorder: Secondary | ICD-10-CM | POA: Diagnosis not present

## 2024-04-11 DIAGNOSIS — F413 Other mixed anxiety disorders: Secondary | ICD-10-CM | POA: Diagnosis not present

## 2024-04-11 DIAGNOSIS — F1994 Other psychoactive substance use, unspecified with psychoactive substance-induced mood disorder: Secondary | ICD-10-CM | POA: Diagnosis not present

## 2024-04-16 DIAGNOSIS — F3160 Bipolar disorder, current episode mixed, unspecified: Secondary | ICD-10-CM | POA: Diagnosis not present

## 2024-04-18 ENCOUNTER — Ambulatory Visit (HOSPITAL_COMMUNITY): Admitting: Licensed Clinical Social Worker

## 2024-04-18 DIAGNOSIS — F3173 Bipolar disorder, in partial remission, most recent episode manic: Secondary | ICD-10-CM

## 2024-04-18 DIAGNOSIS — F411 Generalized anxiety disorder: Secondary | ICD-10-CM | POA: Diagnosis not present

## 2024-04-18 NOTE — Progress Notes (Signed)
 Virtual Visit via Video Note   I connected with Michelle Levy on 04/18/24 at 1:00pm by video enabled telemedicine application and verified that I am speaking with the correct person using two identifiers.   I discussed the limitations, risks, security and privacy concerns of performing an evaluation and management service by video and the availability of in person appointments. I also discussed with the patient that there may be a patient responsible charge related to this service. The patient expressed understanding and agreed to proceed.   I discussed the assessment and treatment plan with the patient. The patient was provided an opportunity to ask questions and all were answered. The patient agreed with the plan and demonstrated an understanding of the instructions.   The patient was advised to call back or seek an in-person evaluation if the symptoms worsen or if the condition fails to improve as anticipated.   I provided 50 minutes of non-face-to-face time during this encounter.     Darleene Ricker, LCSW, LCAS ____________________________  THERAPIST PROGRESS NOTE   Session Time: 1:00pm - 1:50pm     Location: Patient: Patient home Provider: [facility]   Participation Level: Active    Behavioral Response: Alert, casually dressed, anxious mood/affect   Type of Therapy:  Individual Therapy   Treatment Goals addressed: Mood management; Medication management; Maintaining healthier boundaries with support network   Progress Towards Goals: Progressing   Interventions: CBT, problem solving, communication skills   Summary: Michelle Levy is a 68 year old divorced Caucasian female that presented today with diagnoses of Bipolar I Disorder, in partial remission, most recent episode manic; and Generalized Anxiety Disorder.          Suicidal/Homicidal: None; without intent or plan.     Therapist Response:  Clinician met with Michelle Levy today for virtual therapy session and assessed for  safety, medication compliance, and sobriety.  Michelle Levy presented for appointment on time and was alert, oriented x5, with no evidence or self-report of active SI/HI or A/V H.  Michelle Levy denied any abuse of alcohol or illicit substances. Michelle Levy reported compliance with all medication.  Clinician inquired about Michelle Levy's emotional ratings today, as well as any significant changes in thoughts, feelings or behavior since last check-in.  Michelle Levy reported scores of 4/10 for depression, 3/10 for anxiety, and 3/10 for anger/irritability.  Michelle Levy denied any recent panic attacks or outbursts.  Michelle Levy reported that a recent struggle has been worrying about her 61 year old mother, who was recently informed that the family caregiver could no longer serve in that role.  Michelle Levy reported that she worries that other family members will put pressure on her to spend more time over there to assist, and stated I'm afraid for my mother, and myself.  Clinician covered material with Michelle Levy on communication skills which could be utilized to increase understanding and support within the relationship.  Clinician presented a handout on 'soft startups' which offered suggestions on how Michelle Levy could address a problem assertively with members of her family, including tips such as choosing an appropriate time/setting, being mindful of maintaining gentle tone, volume and language, while avoiding triggering nonverbals such as rolling eyes, as well as utilizing "I" statements to express feelings, focusing on one problem at a time, and being respectful.  Clinician also reminded Michelle Levy that use of relaxation/coping skills taught from previous therapy sessions could help address difficult feelings such as anger which could arise when feeling triggered by other people.  Clinician also reviewed available local caregiver resources  with Michelle Levy as options to present to the family.  Intervention was effective, as evidenced by Michelle Levy's active engagement in discussion on subject,  and receptiveness to suggestions offered in improving communication and reducing conflict.  Michelle Levy reported that she will plan to call her nephew this week to bring up concerns about the loss of her mother's caregiver, be clear about her personal boundaries regarding time and energy that can be offered to offset loss of the caregiver, and present reasonable alternatives such as looking into other local caregiver resources as a replacement.  Michelle Levy reported that this session made her feel less anxious, and more confident in ability to assert herself.  Clinician will continue to monitor.   Plan: Follow up in 2 weeks.   Diagnosis: Bipolar I Disorder, in partial remission, most recent episode manic; and Generalized Anxiety Disorder.  Collaboration of Care:   No collaboration of care required for this visit.                                                   Patient/Guardian was advised Release of Information must be obtained prior to any record release in order to collaborate their care with an outside provider. Patient/Guardian was advised if they have not already done so to contact the registration department to sign all necessary forms in order for us  to release information regarding their care.    Consent: Patient/Guardian gives verbal consent for treatment and assignment of benefits for services provided during this visit. Patient/Guardian expressed understanding and agreed to proceed.   Darleene Ricker, LCSW, LCAS 04/18/24

## 2024-05-09 ENCOUNTER — Ambulatory Visit (INDEPENDENT_AMBULATORY_CARE_PROVIDER_SITE_OTHER): Admitting: Licensed Clinical Social Worker

## 2024-05-09 DIAGNOSIS — F3173 Bipolar disorder, in partial remission, most recent episode manic: Secondary | ICD-10-CM | POA: Diagnosis not present

## 2024-05-09 DIAGNOSIS — F411 Generalized anxiety disorder: Secondary | ICD-10-CM | POA: Diagnosis not present

## 2024-05-09 NOTE — Progress Notes (Signed)
 Virtual Visit via Video Note   I connected with Laasya K. Sternberg on 05/09/24 at 1:00pm by video enabled telemedicine application and verified that I am speaking with the correct person using two identifiers.   I discussed the limitations, risks, security and privacy concerns of performing an evaluation and management service by video and the availability of in person appointments. I also discussed with the patient that there may be a patient responsible charge related to this service. The patient expressed understanding and agreed to proceed.   I discussed the assessment and treatment plan with the patient. The patient was provided an opportunity to ask questions and all were answered. The patient agreed with the plan and demonstrated an understanding of the instructions.   The patient was advised to call back or seek an in-person evaluation if the symptoms worsen or if the condition fails to improve as anticipated.   I provided 46 minutes of non-face-to-face time during this encounter.     Darleene Ricker, LCSW, LCAS ____________________________   THERAPIST PROGRESS NOTE   Session Time: 1:00pm - 1:46pm      Location: Patient: Patient home Provider: [facility]   Participation Level: Active    Behavioral Response: Alert, casually dressed, euthymic mood/affect   Type of Therapy:  Individual Therapy   Treatment Goals addressed: Mood management; Medication management; Maintaining healthier boundaries with support network; Psychiatry Followup; Maintaining exercise; Attending church; Safety planning    Progress Towards Goals: Progressing   Interventions: CBT, treatment planning, PHQ9 and GAD7 screenings     Summary: Jawana Reagor. Mccleary is a 68 year old divorced Caucasian female that presented today with diagnoses of Bipolar I Disorder, in partial remission, most recent episode manic; and Generalized Anxiety Disorder.          Suicidal/Homicidal: None; without intent or plan.     Therapist  Response:  Clinician met with [name] today for virtual therapy appointment and assessed for safety, medication compliance, and sobriety.  Adreanna presented for session on time and was alert, oriented x5, with no evidence or self-report of active SI/HI or A/V H.  Raiyah denied any abuse of alcohol or illicit substances. Savaya reported compliance with all medication.  Clinician inquired about Leylani's current emotional ratings, as well as any significant changes in thoughts, feelings or behavior since previous check-in.  Miranda reported scores of 0/10 for depression, 0/10 for anxiety, and 3/10 for anger/irritability.  Sunni denied any recent panic attacks or outbursts.  Marchella reported that a recent success was going to the beach with a friend, and overcoming anxiety related to this challenge, stating I had a pretty good time.  She denied any new struggles at this time.  Clinician recommended revisiting treatment plan today to determine progress toward goals and any present barriers.  Orvilla was agreeable to this, so clinician collaborated with her to make updates as follows with her verbal consent: Meet with clinician biweekly for therapy sessions to update on goal progress and address any needs that arise; Follow up with psychiatrist every 2-3x months regarding efficacy of medication and any dose modification necessary; Take medications daily as prescribed to aid in symptom reduction and improvement of daily functioning; Maintain average daily anxiety level at 0-1/10 for the next 90 days by practicing relaxation techniques with proven efficacy 2-3x daily, in addition to challenging anxious thoughts that arise to negate negative impact on outlook; Exercise for at least 1 hour, x1 per week in addition to following healthy diet to improve both physical  and mental well-being per PCP recommendations; Attend church 1x per week to stay productive, engaged with supportive community, and maintain spiritual support, in addition to  any other available activities such as fellowship meetings x1 per month; Maintain healthier boundaries with all supports to avoid worsening anxiety and depression, as well as enforce assertive communication skills, with goal of limiting contact with toxic supports until more respectful communication patterns are established; Implement 4-5x sleep hygiene techniques in order to maintain average nightly rest at 8 hours and reduce related fatigue and irritability upon waking over next 90 days; Increase average self-esteem from low point of 8/10 up to a 10/10 within next 90 days by engaging in daily recitation of positive self-talk and challenging negative thinking that arises; 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  Progress is evidenced by [name] regularly attending therapy and psychiatry appointments, in addition to staying compliant with medication.  She reported that she is now averaging 0-1/10 in anxiety severity, and has had no reoccurrence of manic or depressive episodes.  Paola reported that she is also working out x1 per week for an hour each session, and attending church each week for spiritual support.  Tanaysha reported that she is also managing boundaries effectively, including limiting time spent on her business to avoid excessive stress, and trying to spend more time with people that love and appreciate her, including traveling to the beach with a friend recently. Nidhi also completed updated PHQ9 and GAD7 screenings today with scores of 1 for each, which reflect successful management of depression and anxiety symptoms.  Clinician will continue to monitor.    Plan: Follow up in 1 month.    Diagnosis: Bipolar I Disorder, in partial remission, most recent episode manic;  and Generalized Anxiety Disorder.   Collaboration of Care:   No collaboration of care required for this visit.                                                   Patient/Guardian was advised Release of Information must be obtained prior to any record release in order to collaborate their care with an outside provider. Patient/Guardian was advised if they have not already done so to contact the registration department to sign all necessary forms in order for us  to release information regarding their care.    Consent: Patient/Guardian gives verbal consent for treatment and assignment of benefits for services provided during this visit. Patient/Guardian expressed understanding and agreed to proceed.   Darleene Ricker, LCSW, LCAS 05/09/24

## 2024-05-17 DIAGNOSIS — Z01419 Encounter for gynecological examination (general) (routine) without abnormal findings: Secondary | ICD-10-CM | POA: Diagnosis not present

## 2024-05-17 DIAGNOSIS — E7800 Pure hypercholesterolemia, unspecified: Secondary | ICD-10-CM | POA: Diagnosis not present

## 2024-05-17 DIAGNOSIS — Z0000 Encounter for general adult medical examination without abnormal findings: Secondary | ICD-10-CM | POA: Diagnosis not present

## 2024-05-17 DIAGNOSIS — F3160 Bipolar disorder, current episode mixed, unspecified: Secondary | ICD-10-CM | POA: Diagnosis not present

## 2024-05-17 DIAGNOSIS — Z1211 Encounter for screening for malignant neoplasm of colon: Secondary | ICD-10-CM | POA: Diagnosis not present

## 2024-05-17 DIAGNOSIS — Z6823 Body mass index (BMI) 23.0-23.9, adult: Secondary | ICD-10-CM | POA: Diagnosis not present

## 2024-05-17 DIAGNOSIS — M8589 Other specified disorders of bone density and structure, multiple sites: Secondary | ICD-10-CM | POA: Diagnosis not present

## 2024-05-17 DIAGNOSIS — E871 Hypo-osmolality and hyponatremia: Secondary | ICD-10-CM | POA: Diagnosis not present

## 2024-05-17 DIAGNOSIS — E2839 Other primary ovarian failure: Secondary | ICD-10-CM | POA: Diagnosis not present

## 2024-05-24 DIAGNOSIS — E871 Hypo-osmolality and hyponatremia: Secondary | ICD-10-CM | POA: Diagnosis not present

## 2024-06-07 ENCOUNTER — Ambulatory Visit (INDEPENDENT_AMBULATORY_CARE_PROVIDER_SITE_OTHER): Admitting: Licensed Clinical Social Worker

## 2024-06-07 DIAGNOSIS — F3173 Bipolar disorder, in partial remission, most recent episode manic: Secondary | ICD-10-CM | POA: Diagnosis not present

## 2024-06-07 DIAGNOSIS — F411 Generalized anxiety disorder: Secondary | ICD-10-CM

## 2024-06-07 NOTE — Progress Notes (Signed)
 Virtual Visit via Video Note   I connected with Michelle Levy on 06/07/24 at 1:00pm by video enabled telemedicine application and verified that I am speaking with the correct person using two identifiers.   I discussed the limitations, risks, security and privacy concerns of performing an evaluation and management service by video and the availability of in person appointments. I also discussed with the patient that there may be a patient responsible charge related to this service. The patient expressed understanding and agreed to proceed.   I discussed the assessment and treatment plan with the patient. The patient was provided an opportunity to ask questions and all were answered. The patient agreed with the plan and demonstrated an understanding of the instructions.   The patient was advised to call back or seek an in-person evaluation if the symptoms worsen or if the condition fails to improve as anticipated.   I provided 45 minutes of non-face-to-face time during this encounter.     Darleene Ricker, LCSW, LCAS ____________________________   THERAPIST PROGRESS NOTE   Session Time: 1:00pm - 1:45pm       Location: Patient: Patient home Provider: [facility]   Participation Level: Active    Behavioral Response: Alert, casually dressed, euthymic mood/affect   Type of Therapy:  Individual Therapy   Treatment Goals addressed: Mood management; Medication management; Maintaining part-time work schedule; Attending church   Progress Towards Goals: Progressing   Interventions: CBT: psychoeducation on work life balance      Summary: Michelle Levy is a 68 year old divorced Caucasian female that presented today with diagnoses of Bipolar I Disorder, in partial remission, most recent episode manic; and Generalized Anxiety Disorder.          Suicidal/Homicidal: None; without intent or plan.     Therapist Response:  Clinician met with Michelle Levy today for virtual therapy session and assessed for  safety, medication compliance, and sobriety.  Michelle Levy presented for appointment on time and was alert, oriented x5, with no evidence or self-report of active SI/HI or A/V H.  Michelle Levy denied any abuse of alcohol or illicit substances. Michelle Levy reported compliance with all medication.  Clinician inquired about Michelle Levy's emotional ratings today, as well as any significant changes in thoughts, feelings or behavior since last check-in.  Michelle Levy reported scores of 3/10 for depression, 2/10 for anxiety, and 0/10 for anger/irritability.  Michelle Levy denied any recent panic attacks or outbursts.  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  She reported that one struggle is trying to determine whether this will positively or negatively impact her overall wellness and treatment goals.  Clinician engaged Michelle Levy in discussion on managing work/life balance today to improve mental health and wellness.  Clinician explained how finding balance between responsibilities at home and work place can be challenging, and lead to increased stress.  Clinician facilitated discussion on what challenges Michelle Levy is currently, or has historically faced.  Clinician also discussed strategies for improving work/life balance while she works on her mental health during treatment.  Some of these included keeping track of time management; creating a list of priorities and scaling importance; setting realistic, measurable goals each day; establishing boundaries; taking care of health needs; and nurturing relationships at home and work for support.  Clinician inquired about areas where Michelle Levy feels she is excelling in, as well as areas she could focus on during treatment. 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  Michelle Levy reported that although she would like to help her church, she doesn't want to this become too demanding, and increase depression or anxiety.  Michelle Levy reported that she has had 'breakdowns' before related to burnout when her work schedule was too hectic, and related symptoms have included feeling overwhelmed, changing sleep habits, and mania.  Michelle Levy reported that there have also been numerous warning signs such as spending too much time ruminating on work duties, pulling out of social engagements due to her work schedule, and having trouble with sleeping habits.  Michelle Levy was receptive to suggestions offered today for addressing work life imbalance, including creating a list of top priorities for work and personal life to balance each week, creating a to-do list each day to stay organized, and setting boundaries to ensure that she leaves work at work.  Michelle Levy stated "I'm not going back to that life".  Clinician will continue to monitor.    Plan: Follow up in 1 month.    Diagnosis: Bipolar I Disorder, in partial remission, most recent episode manic; and Generalized Anxiety Disorder.   Collaboration of Care:   No collaboration of care required for this visit.                                                   Patient/Guardian was advised Release of Information must be obtained prior to any record release in order to collaborate their care with an outside provider. Patient/Guardian was advised if they have not already done so to contact the registration department to sign all necessary forms in order for us  to release information regarding their care.    Consent: Patient/Guardian gives verbal consent for treatment and assignment of benefits for services provided during this visit. Patient/Guardian expressed understanding and agreed to proceed.   Darleene Ricker, LCSW, LCAS 06/07/24

## 2024-06-17 DIAGNOSIS — F3160 Bipolar disorder, current episode mixed, unspecified: Secondary | ICD-10-CM | POA: Diagnosis not present

## 2024-07-02 DIAGNOSIS — F413 Other mixed anxiety disorders: Secondary | ICD-10-CM | POA: Diagnosis not present

## 2024-07-02 DIAGNOSIS — F1994 Other psychoactive substance use, unspecified with psychoactive substance-induced mood disorder: Secondary | ICD-10-CM | POA: Diagnosis not present

## 2024-07-02 DIAGNOSIS — F3181 Bipolar II disorder: Secondary | ICD-10-CM | POA: Diagnosis not present

## 2024-07-12 ENCOUNTER — Ambulatory Visit (HOSPITAL_COMMUNITY): Admitting: Licensed Clinical Social Worker

## 2024-07-12 DIAGNOSIS — F411 Generalized anxiety disorder: Secondary | ICD-10-CM

## 2024-07-12 DIAGNOSIS — F3173 Bipolar disorder, in partial remission, most recent episode manic: Secondary | ICD-10-CM | POA: Diagnosis not present

## 2024-07-12 NOTE — Progress Notes (Signed)
 Virtual Visit via Video Note   I connected with Monigue K. Odland on 07/12/24 at 1:00pm by video enabled telemedicine application and verified that I am speaking with the correct person using two identifiers.   I discussed the limitations, risks, security and privacy concerns of performing an evaluation and management service by video and the availability of in person appointments. I also discussed with the patient that there may be a patient responsible charge related to this service. The patient expressed understanding and agreed to proceed.   I discussed the assessment and treatment plan with the patient. The patient was provided an opportunity to ask questions and all were answered. The patient agreed with the plan and demonstrated an understanding of the instructions.   The patient was advised to call back or seek an in-person evaluation if the symptoms worsen or if the condition fails to improve as anticipated.   I provided 51 minutes of non-face-to-face time during this encounter.     Darleene Ricker, LCSW, LCAS ____________________________   THERAPIST PROGRESS NOTE   Session Time: 1:00pm - 1:51pm        Location: Patient: Patient home Provider: [facility]   Participation Level: Active    Behavioral Response: Alert, casually dressed, euthymic mood/affect   Type of Therapy:  Individual Therapy   Treatment Goals addressed: Mood management; Medication management   Progress Towards Goals: Progressing   Interventions: CBT, grounding skills      Summary: Maryan Sivak. Weis is a 68 year old divorced Caucasian female that presented today with diagnoses of Bipolar I Disorder, in partial remission, most recent episode manic; and Generalized Anxiety Disorder.          Suicidal/Homicidal: None; without intent or plan.     Therapist Response:  Clinician met with [name] today for virtual therapy appointment and assessed for safety, medication compliance, and sobriety.  Rhodia presented for  session on time and was alert, oriented x5, with no evidence or self-report of active SI/HI or A/V H.  Jnyah denied any abuse of alcohol or illicit substances. Astra reported compliance with all medication.  Clinician inquired about Yarielis's current emotional ratings, as well as any significant changes in thoughts, feelings or behavior since previous check-in.  Zohal reported scores of 0/10 for depression, 0/10 for anxiety, and 0/10 for anger/irritability.  Arfa denied any recent panic attacks or outbursts.  Shiori reported that a recent success was deciding to completely quit drinking, and she has abstained for 10 days now.  Neftali reported that a doctor told her that her sodium was low, and alcohol could impact this.  Timberlynn reported that a recent struggle was having a tire blow out on the road yesterday.  She reported that normally this would have been a much more difficult event, but she was able to use her coping skills to stay calm and get AAA involved to help while she ate lunch. Clinician suggested discussion on grounding techniques today with Inetta.  Clinician explained how grounding techniques can be utilized to temporarily distract from distressing thoughts, or feelings, and covered category of mental grounding techniques with her today, including examples for practice such as describing one's environment in detail, playing a categories game, describing an everyday activity in great detail, using one's imagination to visualize a calming image, counting to 10, and more.  Clinician inquired about which one's Brandilynn found appealing, and could see herself adding to developing coping skillset during treatment.  Intervention was effective, as evidenced by [name] engaging in  discussion on the subject and actively trying some of these techniques out in session, noting that they could be helpful if she practices them regularly. Ammy reported that she was receptive to several such as describing her environment in  detail, playing a categories game involving listing breeds of dogs, breaking down a big task into smaller steps, visualizing something relaxing like a nice walk on the beach, reading an interesting local news article or self-help book, playing with her dogs to make her laugh, and counting to 10.  Clinician will continue to monitor.    Plan: Follow up in 1 month.    Diagnosis: Bipolar I Disorder, in partial remission, most recent episode manic; and Generalized Anxiety Disorder.   Collaboration of Care:   No collaboration of care required for this visit.                                                   Patient/Guardian was advised Release of Information must be obtained prior to any record release in order to collaborate their care with an outside provider. Patient/Guardian was advised if they have not already done so to contact the registration department to sign all necessary forms in order for us  to release information regarding their care.    Consent: Patient/Guardian gives verbal consent for treatment and assignment of benefits for services provided during this visit. Patient/Guardian expressed understanding and agreed to proceed.   Darleene Ricker, LCSW, LCAS 07/12/24

## 2024-07-17 DIAGNOSIS — F3160 Bipolar disorder, current episode mixed, unspecified: Secondary | ICD-10-CM | POA: Diagnosis not present

## 2024-07-23 DIAGNOSIS — E871 Hypo-osmolality and hyponatremia: Secondary | ICD-10-CM | POA: Diagnosis not present

## 2024-07-30 DIAGNOSIS — F413 Other mixed anxiety disorders: Secondary | ICD-10-CM | POA: Diagnosis not present

## 2024-07-30 DIAGNOSIS — F3181 Bipolar II disorder: Secondary | ICD-10-CM | POA: Diagnosis not present

## 2024-07-30 DIAGNOSIS — F1994 Other psychoactive substance use, unspecified with psychoactive substance-induced mood disorder: Secondary | ICD-10-CM | POA: Diagnosis not present

## 2024-08-08 ENCOUNTER — Ambulatory Visit (HOSPITAL_COMMUNITY): Admitting: Licensed Clinical Social Worker

## 2024-08-08 DIAGNOSIS — F411 Generalized anxiety disorder: Secondary | ICD-10-CM

## 2024-08-08 DIAGNOSIS — F3173 Bipolar disorder, in partial remission, most recent episode manic: Secondary | ICD-10-CM

## 2024-08-08 NOTE — Progress Notes (Signed)
 Virtual Visit via Video Note   I connected with Michelle Levy on 08/08/24 at 1:00pm by video enabled telemedicine application and verified that I am speaking with the correct person using two identifiers.   I discussed the limitations, risks, security and privacy concerns of performing an evaluation and management service by video and the availability of in person appointments. I also discussed with the patient that there may be a patient responsible charge related to this service. The patient expressed understanding and agreed to proceed.   I discussed the assessment and treatment plan with the patient. The patient was provided an opportunity to ask questions and all were answered. The patient agreed with the plan and demonstrated an understanding of the instructions.   The patient was advised to call back or seek an in-person evaluation if the symptoms worsen or if the condition fails to improve as anticipated.   I provided 47 minutes of non-face-to-face time during this encounter.     Darleene Ricker, LCSW, LCAS ____________________________   THERAPIST PROGRESS NOTE   Session Time: 1:00pm - 1:47pm         Location: Patient: Patient home Provider: Home Office   Participation Level: Active    Behavioral Response: Alert, casually dressed, euthymic mood/affect   Type of Therapy:  Individual Therapy   Treatment Goals addressed: Mood management; Medication management; Improving sleep quality    Progress Towards Goals: Progressing   Interventions: CBT, psychoeducation on sleep hygiene    Summary: Michelle Levy is a 68 year old divorced Caucasian female that presented today with diagnoses of Bipolar I Disorder, in partial remission, most recent episode manic; and Generalized Anxiety Disorder.          Suicidal/Homicidal: None; without intent or plan.     Therapist Response:  Clinician met with Michelle Levy today for virtual therapy session and assessed for safety, medication compliance,  and sobriety.  Karigan presented for appointment on time and was alert, oriented x5, with no evidence or self-report of active SI/HI or A/V H.  Andreina denied any abuse of alcohol or illicit substances. Ravinder reported compliance with all medication.  Clinician inquired about Michelle Levy's emotional ratings today, as well as any significant changes in thoughts, feelings or behavior since last check-in.  Michelle Levy reported scores of 1/10 for depression, 0/10 for anxiety, and 0/10 for anger/irritability.  Michelle Levy denied any recent panic attacks or outbursts.  Michelle Levy reported that a recent success was celebrating her birthday with church friends on the 17th, including having an Nature conservation officer as a surprise Manufacturing systems engineer.  [name] reported that one recent struggle has been getting reduced sleep.  She reported that she was on a sleep medication, but due to issues with her sodium, she had to discontinue it. Clinician discussed topic of sleep hygiene today with [name].  Clinician defined this as the habits, behaviors and environmental factors that can be adjusted to improve overall sleep quality.  Clinician also discussed how lack of consistent sleep can negatively affect mood and overall mental health.  Clinician provided Shann with a screening to complete assessing typical barriers one might face in achieving quality sleep at night (i.e. struggling to get up in the morning, waking up throughout the night, tossing/turning, etc), and inquired about her perception of sleep hygiene at present.  Clinician offered techniques to [facility] via handout which could be implemented in order to improve sleep hygiene, such as sticking to a normal morning/night routine, avoiding using of electronics too close to bedtime, avoiding heavy meals  before bed, using a sleep journal to track changes and address anxious thoughts, as well as avoiding naps during the daytime, ensuring proper use of medications if prescribed any by provider(s), and more.  Clinician inquired  about changes Michelle Levy intends to make to sleep hygiene based upon information covered today.  Interventions were effective, as evidenced by [name] actively participating in discussion on subject, and completing questionnaire, which revealed that although she is not sleep deprived, she would benefit from modifying sleep routine due to averaging 5-6 hours of rest some nights.  Michelle Levy reported that some of the suggestions mentioned could be beneficial to her, including sticking to a regular sleep/wake cycle, avoiding use of her phone close to bedtime due to exposure of stressful news or stories, avoiding use of alcohol close to bedtime, avoiding naps, maintaining relaxing sleep rituals such as a cup of caffeine-free tea at bedtime, abstaining from frequently checking the time during the night, getting some physical exercise each day to ensure that she is tired at night, and watching her diet to ensure she isn't eating too close to bedtime.  Clinician will continue to monitor.    Plan: Follow up in 1 month.    Diagnosis: Bipolar I Disorder, in partial remission, most recent episode manic; and Generalized Anxiety Disorder.   Collaboration of Care:   No collaboration of care required for this visit.                                                   Patient/Guardian was advised Release of Information must be obtained prior to any record release in order to collaborate their care with an outside provider. Patient/Guardian was advised if they have not already done so to contact the registration department to sign all necessary forms in order for us  to release information regarding their care.    Consent: Patient/Guardian gives verbal consent for treatment and assignment of benefits for services provided during this visit. Patient/Guardian expressed understanding and agreed to proceed.   Darleene Ricker, LCSW, LCAS 08/08/24

## 2024-08-15 DIAGNOSIS — Z1211 Encounter for screening for malignant neoplasm of colon: Secondary | ICD-10-CM | POA: Diagnosis not present

## 2024-08-15 DIAGNOSIS — R197 Diarrhea, unspecified: Secondary | ICD-10-CM | POA: Diagnosis not present

## 2024-08-16 ENCOUNTER — Ambulatory Visit (HOSPITAL_COMMUNITY): Admitting: Licensed Clinical Social Worker

## 2024-08-17 DIAGNOSIS — F3160 Bipolar disorder, current episode mixed, unspecified: Secondary | ICD-10-CM | POA: Diagnosis not present

## 2024-09-16 DIAGNOSIS — F3160 Bipolar disorder, current episode mixed, unspecified: Secondary | ICD-10-CM | POA: Diagnosis not present

## 2024-09-20 ENCOUNTER — Ambulatory Visit (INDEPENDENT_AMBULATORY_CARE_PROVIDER_SITE_OTHER): Admitting: Licensed Clinical Social Worker

## 2024-09-20 DIAGNOSIS — F411 Generalized anxiety disorder: Secondary | ICD-10-CM

## 2024-09-20 DIAGNOSIS — F3173 Bipolar disorder, in partial remission, most recent episode manic: Secondary | ICD-10-CM | POA: Diagnosis not present

## 2024-09-20 NOTE — Progress Notes (Signed)
 Virtual Visit via Video Note   I connected with Paighton K. Mchale on 09/20/24 at 2:00pm by video enabled telemedicine application and verified that I am speaking with the correct person using two identifiers.   I discussed the limitations, risks, security and privacy concerns of performing an evaluation and management service by video and the availability of in person appointments. I also discussed with the patient that there may be a patient responsible charge related to this service. The patient expressed understanding and agreed to proceed.   I discussed the assessment and treatment plan with the patient. The patient was provided an opportunity to ask questions and all were answered. The patient agreed with the plan and demonstrated an understanding of the instructions.   The patient was advised to call back or seek an in-person evaluation if the symptoms worsen or if the condition fails to improve as anticipated.   I provided 54 minutes of non-face-to-face time during this encounter.     Darleene Ricker, LCSW, LCAS ____________________________   THERAPIST PROGRESS NOTE   Session Time: 2:00pm - 2:54pm          Location: Patient: Patient home Provider: [facility]   Participation Level: Active    Behavioral Response: Alert, casually dressed, anxious mood/affect   Type of Therapy:  Individual Therapy   Treatment Goals addressed: Mood management; Medication management   Progress Towards Goals: Progressing   Interventions: CBT: challenging anxious thoughts    Summary: Sirenity Shew. Dagley is a 68 year old divorced Caucasian female that presented today with diagnoses of Bipolar I Disorder, in partial remission, most recent episode manic; and Generalized Anxiety Disorder.          Suicidal/Homicidal: None; without intent or plan.     Therapist Response:  Clinician met with [name] today for virtual therapy appointment and assessed for safety, medication compliance, and sobriety.  Lashannon  presented for session on time and was alert, oriented x5, with no evidence or self-report of active SI/HI or A/V H.  Ervin denied any abuse of alcohol or illicit substances. Allani reported compliance with all medication.  Clinician inquired about Yuette's current emotional ratings, as well as any significant changes in thoughts, feelings or behavior since previous check-in.  Cecila reported scores of 7/10 for depression, 7/10 for anxiety, and 5/10 for anger/irritability.  Tanise denied any recent panic attacks or outbursts.  Zakara reported that a recent struggle was having to take her dog in for surgery, which led her to redo the entire house to make navigation through the home safer and easier for the pet.  She stated I've been stressed these past few weeks.  Clinician revisited a handout in session today titled Worry exploration in order to assist Chandi in reducing her anxiety related to her dog's health.  This worksheet featured a series of Socratic questions aimed at exploring the most likely outcomes for a situation of concern, rather than focusing on the worst possible outcome (i.e. catastrophizing).  Clinician assisted Dezra in identifying and challenging any irrational beliefs related to this worry, in addition to utilizing problem solving approach to explore strategies which would help her accomplish goal of supporting her dog as it recovers from surgery.  Aviendha actively participated in discussion on handout, reporting that she is primarily worried that she is not providing proper aftercare for her dog, and the dog's health will decline further.  Hagan reported that there is sufficient evidence to suggest her dog will recover, as she has been taking  the dog to followup appointments with the doctor regularly with good reports, she recently purchased a new comfortable bed the dog can rest on, and she has made changes around the home to prevent injury to the dog's leg, including installing baby gates to  prevent travel into dangerous areas.  Intervention was effective, as evidenced by [name] reporting that discussion on this subject reminded her of the importance in challenging worrying thoughts rather than ruminating throughout the day.  Tarissa reported that her anxiety was reduced to a 5/10 in severity.  Cana stated "Negative thinking just leads to more negative thinking".  Clinician will continue to monitor.    Plan: Follow up in 1 month.    Diagnosis: Bipolar I Disorder, in partial remission, most recent episode manic; and Generalized Anxiety Disorder.   Collaboration of Care:   No collaboration of care required for this visit.                                                   Patient/Guardian was advised Release of Information must be obtained prior to any record release in order to collaborate their care with an outside provider. Patient/Guardian was advised if they have not already done so to contact the registration department to sign all necessary forms in order for us  to release information regarding their care.    Consent: Patient/Guardian gives verbal consent for treatment and assignment of benefits for services provided during this visit. Patient/Guardian expressed understanding and agreed to proceed.   Darleene Ricker, LCSW, LCAS 09/20/24

## 2024-10-01 ENCOUNTER — Other Ambulatory Visit: Payer: Self-pay

## 2024-10-01 ENCOUNTER — Emergency Department (HOSPITAL_BASED_OUTPATIENT_CLINIC_OR_DEPARTMENT_OTHER)

## 2024-10-01 ENCOUNTER — Emergency Department (HOSPITAL_BASED_OUTPATIENT_CLINIC_OR_DEPARTMENT_OTHER): Admitting: Radiology

## 2024-10-01 ENCOUNTER — Encounter (HOSPITAL_BASED_OUTPATIENT_CLINIC_OR_DEPARTMENT_OTHER): Payer: Self-pay

## 2024-10-01 ENCOUNTER — Inpatient Hospital Stay (HOSPITAL_BASED_OUTPATIENT_CLINIC_OR_DEPARTMENT_OTHER)
Admission: EM | Admit: 2024-10-01 | Discharge: 2024-10-05 | DRG: 392 | Disposition: A | Discharge status: Home / Self Care | Source: Ambulatory Visit | Attending: Internal Medicine | Admitting: Internal Medicine

## 2024-10-01 DIAGNOSIS — E871 Hypo-osmolality and hyponatremia: Secondary | ICD-10-CM | POA: Diagnosis present

## 2024-10-01 DIAGNOSIS — R300 Dysuria: Secondary | ICD-10-CM | POA: Diagnosis present

## 2024-10-01 DIAGNOSIS — M8580 Other specified disorders of bone density and structure, unspecified site: Secondary | ICD-10-CM | POA: Diagnosis present

## 2024-10-01 DIAGNOSIS — F3163 Bipolar disorder, current episode mixed, severe, without psychotic features: Secondary | ICD-10-CM | POA: Diagnosis present

## 2024-10-01 DIAGNOSIS — R350 Frequency of micturition: Secondary | ICD-10-CM | POA: Diagnosis present

## 2024-10-01 DIAGNOSIS — R112 Nausea with vomiting, unspecified: Secondary | ICD-10-CM | POA: Diagnosis present

## 2024-10-01 DIAGNOSIS — E860 Dehydration: Secondary | ICD-10-CM | POA: Diagnosis present

## 2024-10-01 DIAGNOSIS — K5720 Diverticulitis of large intestine with perforation and abscess without bleeding: Secondary | ICD-10-CM | POA: Diagnosis present

## 2024-10-01 DIAGNOSIS — Z818 Family history of other mental and behavioral disorders: Secondary | ICD-10-CM

## 2024-10-01 DIAGNOSIS — K651 Peritoneal abscess: Secondary | ICD-10-CM

## 2024-10-01 DIAGNOSIS — Z87891 Personal history of nicotine dependence: Secondary | ICD-10-CM

## 2024-10-01 DIAGNOSIS — F411 Generalized anxiety disorder: Secondary | ICD-10-CM | POA: Diagnosis present

## 2024-10-01 DIAGNOSIS — K5792 Diverticulitis of intestine, part unspecified, without perforation or abscess without bleeding: Principal | ICD-10-CM | POA: Diagnosis present

## 2024-10-01 DIAGNOSIS — Z79899 Other long term (current) drug therapy: Secondary | ICD-10-CM

## 2024-10-01 DIAGNOSIS — R7989 Other specified abnormal findings of blood chemistry: Secondary | ICD-10-CM | POA: Diagnosis present

## 2024-10-01 LAB — COMPREHENSIVE METABOLIC PANEL WITH GFR
ALT: 71 U/L — ABNORMAL HIGH (ref 0–44)
AST: 65 U/L — ABNORMAL HIGH (ref 15–41)
Albumin: 3.4 g/dL — ABNORMAL LOW (ref 3.5–5.0)
Alkaline Phosphatase: 159 U/L — ABNORMAL HIGH (ref 38–126)
Anion gap: 13 (ref 5–15)
BUN: 12 mg/dL (ref 8–23)
CO2: 25 mmol/L (ref 22–32)
Calcium: 8.8 mg/dL — ABNORMAL LOW (ref 8.9–10.3)
Chloride: 89 mmol/L — ABNORMAL LOW (ref 98–111)
Creatinine, Ser: 0.65 mg/dL (ref 0.44–1.00)
GFR, Estimated: >60 mL/min (ref >60)
Glucose, Bld: 117 mg/dL — ABNORMAL HIGH (ref 70–99)
Potassium: 3.8 mmol/L (ref 3.5–5.1)
Sodium: 127 mmol/L — ABNORMAL LOW (ref 135–145)
Total Bilirubin: 0.4 mg/dL (ref 0.0–1.2)
Total Protein: 6.5 g/dL (ref 6.5–8.1)

## 2024-10-01 LAB — CBC
HCT: 30.8 % — ABNORMAL LOW (ref 36.0–46.0)
Hemoglobin: 10.9 g/dL — ABNORMAL LOW (ref 12.0–15.0)
MCH: 31.1 pg (ref 26.0–34.0)
MCHC: 35.4 g/dL (ref 30.0–36.0)
MCV: 87.7 fL (ref 80.0–100.0)
Platelets: 455 K/uL — ABNORMAL HIGH (ref 150–400)
RBC: 3.51 MIL/uL — ABNORMAL LOW (ref 3.87–5.11)
RDW: 12.9 % (ref 11.5–15.5)
WBC: 19.8 K/uL — ABNORMAL HIGH (ref 4.0–10.5)
nRBC: 0 % (ref 0.0–0.2)

## 2024-10-01 LAB — URINALYSIS, ROUTINE W REFLEX MICROSCOPIC
Bacteria, UA: NONE SEEN
Bilirubin Urine: NEGATIVE
Glucose, UA: NEGATIVE mg/dL
Ketones, ur: 15 mg/dL — AB
Leukocytes,Ua: NEGATIVE
Nitrite: NEGATIVE
Protein, ur: 100 mg/dL — AB
Specific Gravity, Urine: >1.046 — ABNORMAL HIGH (ref 1.005–1.030)
pH: 7 (ref 5.0–8.0)

## 2024-10-01 LAB — LIPASE, BLOOD: Lipase: 21 U/L (ref 11–51)

## 2024-10-01 MED ORDER — ONDANSETRON HCL 4 MG/2ML IJ SOLN: 4.0000 mg | Freq: Four times a day (QID) | INTRAMUSCULAR | Status: DC | PRN

## 2024-10-01 MED ORDER — PIPERACILLIN-TAZOBACTAM 3.375 G IVPB 30 MIN
3.3750 g | Freq: Once | INTRAVENOUS | Status: AC
  Administered 2024-10-01: 20:00:00 3.375 g via INTRAVENOUS
  Filled 2024-10-01: qty 50

## 2024-10-01 MED ORDER — POLYETHYLENE GLYCOL 3350 17 G PO PACK: 17.0000 g | PACK | Freq: Every day | ORAL | Status: DC | PRN

## 2024-10-01 MED ORDER — HYDROMORPHONE HCL 1 MG/ML IJ SOLN: 0.5000 mg | INTRAMUSCULAR | Status: DC | PRN

## 2024-10-01 MED ORDER — OXYCODONE HCL 5 MG PO TABS: 5.0000 mg | ORAL_TABLET | ORAL | Status: DC | PRN

## 2024-10-01 MED ORDER — ONDANSETRON HCL 4 MG PO TABS: 4.0000 mg | ORAL_TABLET | Freq: Four times a day (QID) | ORAL | Status: DC | PRN

## 2024-10-01 MED ORDER — ACETAMINOPHEN 500 MG PO TABS
1000.0000 mg | ORAL_TABLET | Freq: Once | ORAL | Status: AC
  Administered 2024-10-01: 20:00:00 1000 mg via ORAL
  Filled 2024-10-01: qty 2

## 2024-10-01 MED ORDER — ACETAMINOPHEN 650 MG RE SUPP: 650.0000 mg | Freq: Four times a day (QID) | RECTAL | Status: DC | PRN

## 2024-10-01 MED ORDER — METOPROLOL TARTRATE 5 MG/5ML IV SOLN: 5.0000 mg | Freq: Four times a day (QID) | INTRAVENOUS | Status: DC | PRN

## 2024-10-01 MED ORDER — ACETAMINOPHEN 325 MG PO TABS: 650.0000 mg | ORAL_TABLET | Freq: Four times a day (QID) | ORAL | Status: DC | PRN

## 2024-10-01 MED ORDER — LACTATED RINGERS IV BOLUS
1000.0000 mL | Freq: Once | INTRAVENOUS | Status: AC
  Administered 2024-10-01: 20:00:00 1000 mL via INTRAVENOUS

## 2024-10-01 MED ORDER — IOHEXOL 300 MG/ML  SOLN
80.0000 mL | Freq: Once | INTRAMUSCULAR | Status: AC | PRN
  Administered 2024-10-01: 19:00:00 80 mL via INTRAVENOUS

## 2024-10-01 MED ORDER — ONDANSETRON HCL 4 MG/2ML IJ SOLN
4.0000 mg | Freq: Once | INTRAMUSCULAR | Status: AC
  Administered 2024-10-01: 20:00:00 4 mg via INTRAVENOUS
  Filled 2024-10-01: qty 2

## 2024-10-01 MED ORDER — LACTATED RINGERS IV BOLUS
1000.0000 mL | Freq: Once | INTRAVENOUS | Status: AC
  Administered 2024-10-01: 22:00:00 1000 mL via INTRAVENOUS

## 2024-10-01 NOTE — ED Provider Notes (Signed)
 [place] EMERGENCY DEPARTMENT AT [facility] Provider Note   CSN: [id number] Arrival date & time: 10/01/24  1727     Patient presents with: Abdominal Pain and Dizziness   Michelle Levy is a 68 y.o. female.    Abdominal Pain Dizziness  Patient has some generalized fatigue weakness Friday evening and became more weak and fatigued was noted to have some abdominal pain Saturday morning some nausea without vomiting.  No chest pain or difficulty breathing.  No cough or hemoptysis no blood in her stool.       Prior to Admission medications  Medication Sig Start Date End Date Taking? Authorizing Provider  B Complex-C (B-COMPLEX WITH VITAMIN C) tablet Take 1 tablet by mouth daily.    [provider]  calcium carbonate (OS-CAL - DOSED IN MG OF ELEMENTAL CALCIUM) 1250 (500 Ca) MG tablet Take 1 tablet by mouth daily with breakfast.    [provider]  cholecalciferol  (VITAMIN D ) 1000 units tablet Take 1,000 Units by mouth daily.    [provider]  Collagen-Vitamin C-Biotin (COLLAGEN PO) Take 1 Units by mouth daily.    [provider]  divalproex  (DEPAKOTE  ER) 250 MG 24 hr tablet Take 3 tablets (750 mg total) by mouth at bedtime. 09/20/23   Delsie Lynwood Morene Lavone, MD  hydrOXYzine  (ATARAX ) 25 MG tablet Take 1 tablet (25 mg total) by mouth 3 (three) times daily as needed for anxiety. 09/20/23   Delsie Lynwood Morene Lavone, MD  magnesium  oxide (MAG-OX) 400 (240 Mg) MG tablet Take 400 mg by mouth daily.    [provider]  OLANZapine  (ZYPREXA ) 15 MG tablet Take 1 tablet (15 mg total) by mouth at bedtime. 09/20/23   Delsie Lynwood Morene Lavone, MD  omega-3 acid ethyl esters (LOVAZA) 1 g capsule Take 1 g by mouth daily.    [provider]  traZODone  (DESYREL ) 50 MG tablet Take 1 tablet (50 mg total) by mouth at bedtime as needed for sleep. 09/20/23   Delsie Lynwood Morene Lavone, MD    Allergies: Patient has no  known allergies.    Review of Systems  Gastrointestinal:  Positive for abdominal pain.  Neurological:  Positive for dizziness.    Updated Vital Signs BP 134/61   Pulse 99   Temp 99.7 F (37.6 C) (Oral)   Resp 18   SpO2 100%   Physical Exam Vitals and nursing note reviewed.  Constitutional:      General: She is not in acute distress.    Appearance: She is ill-appearing.  HENT:     Head: Normocephalic and atraumatic.     Nose: Nose normal.  Eyes:     General: No scleral icterus. Cardiovascular:     Rate and Rhythm: Normal rate and regular rhythm.     Pulses: Normal pulses.     Heart sounds: Normal heart sounds.  Pulmonary:     Effort: Pulmonary effort is normal. No respiratory distress.     Breath sounds: No wheezing.  Abdominal:     Palpations: Abdomen is soft.     Tenderness: There is abdominal tenderness in the right upper quadrant, right lower quadrant, periumbilical area and suprapubic area.  Musculoskeletal:     Cervical back: Normal range of motion.     Right lower leg: No edema.     Left lower leg: No edema.  Skin:    General: Skin is warm and dry.     Capillary Refill: Capillary refill takes less than 2 seconds.  Neurological:     Mental Status: She is alert. Mental status is at baseline.  Psychiatric:        Mood and Affect: Mood normal.        Behavior: Behavior normal.     (all labs ordered are listed, but only abnormal results are displayed) Labs Reviewed  COMPREHENSIVE METABOLIC PANEL WITH GFR - Abnormal; Notable for the following components:      Result Value   Sodium 127 (*)    Chloride 89 (*)    Glucose, Bld 117 (*)    Calcium 8.8 (*)    Albumin 3.4 (*)    AST 65 (*)    ALT 71 (*)    Alkaline Phosphatase 159 (*)    All other components within normal limits  CBC - Abnormal; Notable for the following components:   WBC 19.8 (*)    RBC 3.51 (*)    Hemoglobin 10.9 (*)    HCT 30.8 (*)    Platelets 455 (*)    All other components within  normal limits  LIPASE, BLOOD  URINALYSIS, ROUTINE W REFLEX MICROSCOPIC    EKG: None  Radiology: No results found.   .Critical Care  Performed by: Neldon Hamp RAMAN, PA Authorized by: Neldon Hamp RAMAN, PA   Critical care provider statement:    Critical care time (minutes):  35   Critical care time was exclusive of:  Separately billable procedures and treating other patients and teaching time   Critical care was necessary to treat or prevent imminent or life-threatening deterioration of the following conditions: Intra-abdominal pathology/abscess.   Critical care was time spent personally by me on the following activities:  Development of treatment plan with patient or surrogate, review of old charts, re-evaluation of patient's condition, pulse oximetry, ordering and review of radiographic studies, ordering and review of laboratory studies, ordering and performing treatments and interventions, obtaining history from patient or surrogate, examination of patient and evaluation of patient's response to treatment   Care discussed with: admitting provider      Medications Ordered in the ED  lactated ringers  bolus 1,000 mL (has no administration in time range)  ondansetron  (ZOFRAN ) injection 4 mg (has no administration in time range)  acetaminophen  (TYLENOL ) tablet 1,000 mg (has no administration in time range)    Clinical Course as of 10/01/24 1904  Mon Oct 01, 2024  8146 Started feeling fatigued and weak around Friday night.   Decreased PO intake [WF]    Clinical Course User Index [WF] Neldon Hamp RAMAN, PA                                 Medical Decision Making Amount and/or Complexity of Data Reviewed Labs: ordered. Radiology: ordered.  Risk OTC drugs. Prescription drug management. Decision regarding hospitalization.   This patient presents to the ED for concern of abd pain, this involves a number of treatment options, and is a complaint that carries with it a moderate  risk of complications and morbidity. A differential diagnosis was considered for the patient's symptoms which is discussed below:   The causes of generalized abdominal pain include but are not limited to AAA, mesenteric ischemia, appendicitis, diverticulitis, DKA, gastritis, gastroenteritis, AMI, nephrolithiasis, pancreatitis, peritonitis, adrenal insufficiency,lead poisoning, iron toxicity, intestinal ischemia, constipation, UTI,SBO/LBO, splenic rupture, biliary disease, IBD, IBS, PUD, or hepatitis. Ectopic pregnancy, ovarian torsion, PID.    Co morbidities: Discussed in HPI   Brief History:  Patient has some generalized fatigue weakness Friday evening and became more weak and fatigued was noted to have some abdominal pain Saturday morning some nausea without vomiting.  No chest pain or difficulty breathing.  No cough or hemoptysis no blood in her stool.    EMR reviewed including pt PMHx, past surgical history and past visits to ER.   See HPI for more details   Lab Tests:   I ordered and independently interpreted labs. Labs notable for Leukocytosis of 20, elevated platelets likely acute phase reactant  Sodium of 127, mild transaminitis lipase normal urinalysis without evidence of acute infection.  Imaging Studies:  Abnormal findings. I personally reviewed all imaging studies. Imaging notable for Intra-abdominal abscess abutting diverticulitis   Cardiac Monitoring:  The patient was maintained on a cardiac monitor.  I personally viewed and interpreted the cardiac monitored which showed an underlying rhythm of: sinus rhythm NA   Medicines ordered:  I ordered medication including Zosyn , lactated Ringer 's, Tylenol  for hydration antibiosis Reevaluation of the patient after these medicines showed that the patient improved I have reviewed the patients home medicines and have made adjustments as needed   Critical Interventions:     Consults/Attending Physician   I  requested consultation with discussed with the [place] of general surgery who recommends admission, Zosyn ,  and discussed lab and imaging findings as well as pertinent plan - they recommend: hosp admit   Reevaluation:  After the interventions noted above I re-evaluated patient and found that they have :improved   Social Determinants of Health:      Problem List / ED Course:  Patient had strep double abscess secondary to likely microperforation from diverticulitis no obvious perforation seen on CT.  Discussed that general surgery who will see in consultation with hospitalist Dr. Marcene.    Dispostion:  After consideration of the diagnostic results and the patients response to treatment, I feel that the patent would benefit from admission      Final diagnoses:  None    ED Discharge Orders     None          Neldon Hamp RAMAN, [place] 10/01/24 2127    Yolande Lamar BROCKS, MD 10/01/24 2153

## 2024-10-01 NOTE — Progress Notes (Signed)
 Hospitalist Transfer Note:    Nursing staff, Please call [facility] Admits & Consults System-Wide number on Amion [phone]) as soon as patient's arrival, so appropriate admitting provider can evaluate the pt.   Transferring facility: [facility] Requesting provider: Hamp Bow, PA (EDP at [facility]) Reason for transfer: admission for further evaluation and management of acute diverticulitis with evidence of abscess.   69 year old female, who presented to [facility] ED complaining of abdominal discomfort.   Vital signs in the ED were notable for the following: Temperature max 99.7; heart rates in the 70s to 90s; systolic blood pressures in the 1 teens to 130s; respiratory rate 18, and oxygen saturation 100% on room air.  Labs were notable for CBC, which showed white blood cell count of 19,800.  Imaging notable for CT abdomen/pelvis with contrast, which was reported to show evidence of acute diverticulitis complicated by abscess, in the absence of evidence of perforation or obstruction.  EDP has discussed patient's case with on-call general surgery, Dr. Paola, who recommends hospitalist admission, and conveys that general surgery will formally consult.  Dr. Paola request IV antibiotics and does not have a preference for admission to [facility] vs [facility].   Medications administered prior to transfer included the following: zosyn .  Subsequently, I accepted this patient for transfer for inpatient admission to a pcu bed at [facility] or [facility] (first available) for further work-up and management of the above.      Eva Pore, DO Hospitalist

## 2024-10-01 NOTE — ED Triage Notes (Signed)
 Patient reports abdominal pain, some dizziness, and diarrhea. She was seen at [facility] and was found to have an elevated WBC count and told to come here for further evaluation.

## 2024-10-02 ENCOUNTER — Encounter (HOSPITAL_BASED_OUTPATIENT_CLINIC_OR_DEPARTMENT_OTHER): Payer: Self-pay | Admitting: Internal Medicine

## 2024-10-02 DIAGNOSIS — K5792 Diverticulitis of intestine, part unspecified, without perforation or abscess without bleeding: Secondary | ICD-10-CM | POA: Diagnosis not present

## 2024-10-02 DIAGNOSIS — E860 Dehydration: Secondary | ICD-10-CM | POA: Diagnosis present

## 2024-10-02 DIAGNOSIS — Z818 Family history of other mental and behavioral disorders: Secondary | ICD-10-CM | POA: Diagnosis not present

## 2024-10-02 DIAGNOSIS — M8580 Other specified disorders of bone density and structure, unspecified site: Secondary | ICD-10-CM | POA: Diagnosis present

## 2024-10-02 DIAGNOSIS — R7989 Other specified abnormal findings of blood chemistry: Secondary | ICD-10-CM | POA: Diagnosis present

## 2024-10-02 DIAGNOSIS — E871 Hypo-osmolality and hyponatremia: Secondary | ICD-10-CM | POA: Insufficient documentation

## 2024-10-02 DIAGNOSIS — R112 Nausea with vomiting, unspecified: Secondary | ICD-10-CM | POA: Diagnosis present

## 2024-10-02 DIAGNOSIS — Z79899 Other long term (current) drug therapy: Secondary | ICD-10-CM | POA: Diagnosis not present

## 2024-10-02 DIAGNOSIS — F3163 Bipolar disorder, current episode mixed, severe, without psychotic features: Secondary | ICD-10-CM | POA: Diagnosis present

## 2024-10-02 DIAGNOSIS — K651 Peritoneal abscess: Secondary | ICD-10-CM | POA: Diagnosis not present

## 2024-10-02 DIAGNOSIS — R1084 Generalized abdominal pain: Secondary | ICD-10-CM | POA: Diagnosis present

## 2024-10-02 DIAGNOSIS — K5720 Diverticulitis of large intestine with perforation and abscess without bleeding: Secondary | ICD-10-CM | POA: Diagnosis present

## 2024-10-02 DIAGNOSIS — F411 Generalized anxiety disorder: Secondary | ICD-10-CM | POA: Diagnosis present

## 2024-10-02 DIAGNOSIS — Z87891 Personal history of nicotine dependence: Secondary | ICD-10-CM | POA: Diagnosis not present

## 2024-10-02 DIAGNOSIS — R300 Dysuria: Secondary | ICD-10-CM | POA: Diagnosis present

## 2024-10-02 DIAGNOSIS — R350 Frequency of micturition: Secondary | ICD-10-CM | POA: Diagnosis present

## 2024-10-02 LAB — TSH: TSH: 1.78 u[IU]/mL (ref 0.350–4.500)

## 2024-10-02 LAB — T4, FREE
Free T4: 1.34 ng/dL (ref 0.80–2.00)
Free T4: 1.41 ng/dL (ref 0.80–2.00)

## 2024-10-02 MED ORDER — PIPERACILLIN-TAZOBACTAM 3.375 G IVPB
3.3750 g | Freq: Three times a day (TID) | INTRAVENOUS | Status: DC
  Administered 2024-10-02 – 2024-10-05 (×10): 3.375 g via INTRAVENOUS
  Filled 2024-10-02 (×10): qty 50

## 2024-10-02 MED ORDER — ACETAMINOPHEN 500 MG PO TABS: 1000.0000 mg | ORAL_TABLET | Freq: Four times a day (QID) | ORAL | Status: DC | PRN

## 2024-10-02 MED ORDER — DIVALPROEX SODIUM ER 500 MG PO TB24
750.0000 mg | ORAL_TABLET | Freq: Every day | ORAL | Status: DC
  Administered 2024-10-02 – 2024-10-04 (×3): 750 mg via ORAL
  Filled 2024-10-02 (×3): qty 1

## 2024-10-02 MED ORDER — SODIUM CHLORIDE 0.9 % IV SOLN: INTRAVENOUS | Status: AC

## 2024-10-02 MED ORDER — ACETAMINOPHEN 650 MG RE SUPP: 650.0000 mg | Freq: Four times a day (QID) | RECTAL | Status: DC | PRN

## 2024-10-02 NOTE — Progress Notes (Signed)
 Patient ID: Michelle Levy, female   DOB: 13-Apr-1956, 68 y.o.   MRN: [id number] Asked by [facility] to evaluate pt's imaging studies for possible diverticular abscess drain placement.  Images were reviewed by Dr. Jennefer. Area in question is guarded by overlying bowel, bladder, and vasculature.  Agree with surgery's plan to treat conservatively for now, and if worsens, get repeat CT to assess for enlargement which may provide a percutaneous window for a drain.

## 2024-10-02 NOTE — H&P (Signed)
 History and Physical    Michelle Levy FMW:[id number] DOB: 10-20-1955 DOA: 10/01/2024  DOS: the patient was seen and examined on 10/01/2024  PCP: Aisha Harvey, MD   Patient coming from: Home  I have personally briefly reviewed patient's old medical records in [facility]  CC: abd pain, increased diarrhea, sent to ER due to elevated WBC by [facility] HPI: 68 year old female with a history bipolar disorder on chronic Depakote  who presents to the ER today with a 1 week history of increasing diarrhea at home.  Occasional abdominal pain.  Some nausea but no vomiting.  No hematochezia.  Patient states that she stopped her multivitamins this past week due to an upcoming colonoscopy.  She has not started her colonoscopy prep.  She noted that her frequency and consistency of her stools have increased over the last week.  She normally has a bowel movement that is firm and solid about 1 or 2 times a day.  Over the last week, she has noticed increasing her frequency of bowel movements now 4-5 times in a day.  Stool volume is actually decreased but now consistency is turned into liquid.  This coincides with her having some nausea.  Her appetite has been decreased.  There has been no fevers.  On arrival to the ER, temp 98.7 heart rate 99 blood pressure 134/61 initial room air saturations were 100% on room air.  She did have 1 room air saturations of 86%.  She was started on 2 L of oxygen but then her oxygen was weaned off.  This may have been a spurious pulse oximetry value.  White count of 19.8, hemoglobin 10.9, platelets of 455  Sodium 127, Tessman 3.8, chloride of 89, bicarb 25, BUN of 12, creatinine 0.67, glucose 117, calcium 8.8  Total protein 6.5, BUN 3.4, AST is 65, ALT of 71, alk phos of 159, total bili 0.4  Lipase normal at 21  UA showed specific gravity 1.046, pH of 7.0, ketones 15, protein 100, nitrate negative, leukocyte esterase negative  CT abdomen pelvis with IV contrast  demonstrated sigmoid diverticulitis with adjacent abscesses.  2 abscesses.  First 1 measuring 5.4 x 3.8 x 6.2 cm on the right margin of the sigmoid colon.  On the left side the sigmoid colon there is a 1.1 x 3.4 x 2.1 cm abscess.  This abuts the dome of the bladder.  There is surrounding inflammatory stranding.  EDP contacted Dr Paola with general surgery who requested the patient be admitted to the hospital service.  General surgery will consult. Significant Events: Admitted 10/01/2024 acute sigmoid diverticulitis with multiple abscesses   Admission Labs: White count of 19.8, hemoglobin 10.9, platelets of 455 Sodium 127, Tessman 3.8, chloride of 89, bicarb 25, BUN of 12, creatinine 0.67, glucose 117, calcium 8.8 Total protein 6.5, BUN 3.4, AST is 65, ALT of 71, alk phos of 159, total bili 0.4 Lipase normal at 21 UA showed specific gravity 1.046, pH of 7.0, ketones 15, protein 100, nitrate negative, leukocyte esterase negative  Admission Imaging Studies: CT abdomen pelvis with IV contrast demonstrated sigmoid diverticulitis with adjacent abscesses.  2 abscesses.  First 1 measuring 5.4 x 3.8 x 6.2 cm on the right margin of the sigmoid colon.  On the left side the sigmoid colon there is a 1.1 x 3.4 x 2.1 cm abscess.  This abuts the dome of the bladder.  There is surrounding inflammatory stranding.  Significant Labs:   Significant Imaging Studies:   Antibiotic Therapy: Anti-infectives (  From admission, onward)    Start     Dose/Rate Route Frequency Ordered Stop   10/02/24 0930  piperacillin -tazobactam (ZOSYN ) IVPB 3.375 g        3.375 g 12.5 mL/hr over 240 Minutes Intravenous Every 8 hours 10/02/24 0923     10/01/24 2015  piperacillin -tazobactam (ZOSYN ) IVPB 3.375 g        3.375 g 100 mL/hr over 30 Minutes Intravenous  Once 10/01/24 2005 10/01/24 2056       Procedures:   Consultants: Dr. Paola with general surgery    ED Course: CT scan shows 2 abscesses around the sigmoid  colon.  Started on IV Zosyn .  Started on IV fluids.  General surgery consulted.  Review of Systems:  Review of Systems  Constitutional:  Positive for malaise/fatigue.        Decreased appetite.  HENT: Negative.    Eyes: Negative.   Respiratory: Negative.    Cardiovascular: Negative.   Gastrointestinal:  Positive for abdominal pain, diarrhea and nausea. Negative for blood in stool and vomiting.  Genitourinary:        Increased urinary frequency but no dysuria  Musculoskeletal: Negative.   Skin: Negative.   Neurological: Negative.   Endo/Heme/Allergies: Negative.   Psychiatric/Behavioral:         On chronic Depakote  for bipolar disorder  All other systems reviewed and are negative.   Past Medical History:  Diagnosis Date   Alcohol use disorder, severe, dependence (HCC) 11/24/2015   Bereavement 11/24/2015   Bipolar affective disorder, current episode manic (HCC) 09/07/2023    Past Surgical History:  Procedure Laterality Date   CESAREAN SECTION       reports that she has never smoked. She has never used smokeless tobacco. She reports that she does not drink alcohol and does not use drugs.  Allergies[1]  Family History  Problem Relation Age of Onset   Alcohol abuse Mother    Bipolar disorder Mother    Alcohol abuse Father    Depression Sister    Bipolar disorder Sister    Breast cancer Neg Hx     Prior to Admission medications  Medication Sig Start Date End Date Taking? Authorizing Provider  B Complex-C (B-COMPLEX WITH VITAMIN C) tablet Take 1 tablet by mouth daily.   Yes [provider]  Calcium Carbonate Antacid (CALCIUM CARBONATE PO) Take 250 mg by mouth 2 (two) times daily.   Yes [provider]  cholecalciferol  (VITAMIN D ) 1000 units tablet Take 1,000 Units by mouth daily.   Yes [provider]  Collagen-Vitamin C-Biotin (COLLAGEN PO) 1 scoop daily in coffee   Yes [provider]  Cranberry 250 MG CAPS Take 250 mg by mouth  daily.   Yes [provider]  divalproex  (DEPAKOTE  ER) 250 MG 24 hr tablet Take 3 tablets (750 mg total) by mouth at bedtime. 09/20/23  Yes Delsie Lynwood Morene Lavone, MD  Magnesium  Carbonate, Antacid, (MAGNESIUM  CARBONATE PO) Take 1 tablet by mouth 2 (two) times daily.   Yes [provider]  Menaquinone-7 (FT VITAMIN K2 PO) Take 100 mcg by mouth daily. 09/22/23  Yes [provider]  Milk Thistle 300 MG CAPS Take 300 mg by mouth daily.   Yes [provider]  Multiple Vitamin (MULTIVITAMIN PO) Take 1 tablet by mouth daily.   Yes [provider]  Omega 3-6-9 Fatty Acids (TRIPLE OMEGA COMPLEX) CPDR Take 1 capsule by mouth daily.   Yes [provider]  Saccharomyces boulardii (DAILY PROBIOTIC SUPPLEMENT  PO) Take 1 tablet by mouth daily.   Yes [provider]    Physical Exam: Vitals:   10/02/24 0522 10/02/24 0632 10/02/24 0847 10/02/24 0855  BP: 106/60 (!) 103/56  113/62  Pulse: 76 67  80  Resp: 15   17  Temp: 98.2 F (36.8 C)   98.2 F (36.8 C)  TempSrc: Oral   Oral  SpO2: 100% 100%  97%  Weight:   56 kg   Height:   5' 3 (1.6 m)     Physical Exam Vitals and nursing note reviewed.  Constitutional:      General: She is not in acute distress.    Appearance: Normal appearance. She is normal weight. She is not toxic-appearing or diaphoretic.  HENT:     Head: Normocephalic and atraumatic.     Nose: Nose normal.  Eyes:     General: No scleral icterus. Cardiovascular:     Rate and Rhythm: Normal rate and regular rhythm.  Pulmonary:     Effort: Pulmonary effort is normal. No respiratory distress.     Breath sounds: Normal breath sounds. No wheezing.  Abdominal:     General: Bowel sounds are normal. There is no distension.     Palpations: Abdomen is soft.     Tenderness: There is no guarding or rebound.      Comments: Mild mid/LLQ tenderness  Musculoskeletal:     Cervical back: No tenderness.     Right lower leg: No  edema.     Left lower leg: No edema.  Skin:    General: Skin is warm and dry.     Capillary Refill: Capillary refill takes less than 2 seconds.  Neurological:     General: No focal deficit present.     Mental Status: She is alert and oriented to person, place, and time.      Labs on Admission: I have personally reviewed following labs and imaging studies  CBC: Recent Labs  Lab 10/01/24 1757  WBC 19.8*  HGB 10.9*  HCT 30.8*  MCV 87.7  PLT 455*   Basic Metabolic Panel: Recent Labs  Lab 10/01/24 1757  NA 127*  K 3.8  CL 89*  CO2 25  GLUCOSE 117*  BUN 12  CREATININE 0.65  CALCIUM 8.8*   GFR: Estimated Creatinine Clearance: 54.1 mL/min (by C-G formula based on SCr of 0.65 mg/dL). Liver Function Tests: Recent Labs  Lab 10/01/24 1757  AST 65*  ALT 71*  ALKPHOS 159*  BILITOT 0.4  PROT 6.5  ALBUMIN 3.4*   Recent Labs  Lab 10/01/24 1757  LIPASE 21   Urine analysis:    Component Value Date/Time   COLORURINE YELLOW 10/01/2024 2042   APPEARANCEUR CLEAR 10/01/2024 2042   LABSPEC >1.046 (H) 10/01/2024 2042   PHURINE 7.0 10/01/2024 2042   GLUCOSEU NEGATIVE 10/01/2024 2042   HGBUR SMALL (A) 10/01/2024 2042   BILIRUBINUR NEGATIVE 10/01/2024 2042   KETONESUR 15 (A) 10/01/2024 2042   PROTEINUR 100 (A) 10/01/2024 2042   NITRITE NEGATIVE 10/01/2024 2042   LEUKOCYTESUR NEGATIVE 10/01/2024 2042    Radiological Exams on Admission: I have personally reviewed images DG Chest 2 View Result Date: 10/01/2024 EXAM: 2 VIEW(S) XRAY OF THE CHEST 10/01/2024 07:44:00 PM COMPARISON: None available. CLINICAL HISTORY: SOB FINDINGS: LUNGS AND PLEURA: No focal pulmonary opacity. No pleural effusion. No pneumothorax. HEART AND MEDIASTINUM: Aortic atherosclerosis. No acute abnormality of the cardiac and mediastinal silhouettes. BONES AND SOFT TISSUES: Multilevel thoracic osteophytosis. IMPRESSION: 1. No acute cardiopulmonary  process. 2. Aortic atherosclerosis. 3. Multilevel thoracic  osteophytosis. Electronically signed by: Greig Pique MD 10/01/2024 07:56 PM EST RP Workstation: [id number]   CT ABDOMEN PELVIS W CONTRAST Result Date: 10/01/2024 EXAM: CT ABDOMEN AND PELVIS WITH CONTRAST 10/01/2024 07:33:38 PM TECHNIQUE: CT of the abdomen and pelvis was performed with the administration of intravenous contrast. 80 mL of iohexol  (OMNIPAQUE ) 300 MG/ML solution was administered. Multiplanar reformatted images are provided for review. Automated exposure control, iterative reconstruction, and/or weight-based adjustment of the mA/kV was utilized to reduce the radiation dose to as low as reasonably achievable. COMPARISON: None available. CLINICAL HISTORY: RLQ abdominal pain; RLQ and RUQ abd ttp -- concern for malignancy vs appendicitis. FINDINGS: LOWER CHEST: No acute abnormality. LIVER: The liver is unremarkable. GALLBLADDER AND BILE DUCTS: Small gallstone is present. No biliary ductal dilatation. SPLEEN: No acute abnormality. PANCREAS: No acute abnormality. ADRENAL GLANDS: No acute abnormality. 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 There is wall thickening of the dome of the bladder. No stones in the kidneys or ureters. No hydronephrosis. No perinephric or periureteral stranding. GI AND BOWEL: Stomach appears normal. There is marked wall thickening of the sigmoid colon. There is surrounding inflammatory stranding. Enhancing fluid collection with air which appears multiloculated seen adjacent to the right margin of the sigmoid colon measuring 5.4 x 3.8 x 6.2 cm. A smaller air fluid collection is seen along the left side of the sigmoid colon measuring 1.1 x 3.4 x 2.1 cm. This abuts the dome of the bladder. There is surrounding inflammatory stranding. There are numerous air fluid levels scattered throughout small bowel loops. There is no evidence for bowel obstruction. The appendix is not visualized. PERITONEUM AND RETROPERITONEUM: There is a small amount of  free fluid in the pelvis. No gross free intraperitoneal air. VASCULATURE: Aorta is normal in caliber. LYMPH NODES: No lymphadenopathy. REPRODUCTIVE ORGANS: There is a calcified uterine fibroid in the fundal region measuring 2 cm. The ovaries are not well delineated. BONES AND SOFT TISSUES: No acute osseous abnormality. No focal soft tissue abnormality. IMPRESSION: 1. Findings consistent with sigmoid diverticulitis or colitis with adjacent abscesses; recommend surgical and/or interventional consultation for management. 2. Small gallstone. 3. Bladder dome wall thickening, likely reactive secondary to adjacent abscess . 4. Calcified 2 cm uterine fibroid. Electronically signed by: Greig Pique MD 10/01/2024 07:50 PM EST RP Workstation: [id number]    EKG: My personal interpretation of EKG shows: no EKG to review  Assessment/Plan Principal Problem:   Diverticulitis of large intestine with abscess Active Problems:   Bipolar disorder, curr episode mixed, severe, w/o psychotic features (HCC)   Hyponatremia   Elevated LFTs    Assessment and Plan: * Diverticulitis of large intestine with abscess 10/02/2024 admit to MedSurg bed.  Continue with IV Zosyn .  General surgery consulted.  Continue with IV Dilaudid  as needed.  Discussed that the patient may need either IR for abscess drain placement versus surgery for abscess draining.  She understands this.  Keep her NPO.  SCDs for DVT prophylaxis for now.  Bipolar disorder, curr episode mixed, severe, w/o psychotic features (HCC) 10/02/2024  Continue with her Depakote .  She has been on this for very long time.  I think her hyponatremia is likely due to volume loss from her diarrhea this week rather than from Depakote .   Elevated LFTs 10/02/2024 mildly elevated LFTs.  This could be due to her acute infectious process.  Total bili is 0.4.  Repeat  CMP in the morning.  Hyponatremia 10/02/2024 check TSH and free T4.  I think this is hypovolemic hyponatremia.   Continue with IV fluids with normal saline.  If her hyponatremia persists tomorrow, can check urine electrolytes as well as serum osmolality and urine osmolality.  Repeat CMP in the morning.   DVT prophylaxis: SCDs Code Status: Full Code Family Communication: no family at bedside. Pt is decisional  Disposition Plan: return home  Consults called: general surger Dr. Paola  Admission status: Inpatient, Med-Surg   Camellia Door, DO [facility] 10/02/2024, 12:30 PM       [1] No Known Allergies

## 2024-10-02 NOTE — Assessment & Plan Note (Addendum)
 10/02/2024 mildly elevated LFTs.  This could be due to her acute infectious process.  Total bili is 0.4.  Repeat  CMP in the morning.

## 2024-10-02 NOTE — Hospital Course (Signed)
 CC: abd pain, increased diarrhea, sent to ER due to elevated WBC by [facility] HPI: 68 year old female with a history bipolar disorder on chronic Depakote  who presents to the ER today with a 1 week history of increasing diarrhea at home.  Occasional abdominal pain.  Some nausea but no vomiting.  No hematochezia.  Patient states that she stopped her multivitamins this past week due to an upcoming colonoscopy.  She has not started her colonoscopy prep.  She noted that her frequency and consistency of her stools have increased over the last week.  She normally has a bowel movement that is firm and solid about 1 or 2 times a day.  Over the last week, she has noticed increasing her frequency of bowel movements now 4-5 times in a day.  Stool volume is actually decreased but now consistency is turned into liquid.  This coincides with her having some nausea.  Her appetite has been decreased.  There has been no fevers.  On arrival to the ER, temp 98.7 heart rate 99 blood pressure 134/61 initial room air saturations were 100% on room air.  She did have 1 room air saturations of 86%.  She was started on 2 L of oxygen but then her oxygen was weaned off.  This may have been a spurious pulse oximetry value.  White count of 19.8, hemoglobin 10.9, platelets of 455  Sodium 127, Tessman 3.8, chloride of 89, bicarb 25, BUN of 12, creatinine 0.67, glucose 117, calcium 8.8  Total protein 6.5, BUN 3.4, AST is 65, ALT of 71, alk phos of 159, total bili 0.4  Lipase normal at 21  UA showed specific gravity 1.046, pH of 7.0, ketones 15, protein 100, nitrate negative, leukocyte esterase negative  CT abdomen pelvis with IV contrast demonstrated sigmoid diverticulitis with adjacent abscesses.  2 abscesses.  First 1 measuring 5.4 x 3.8 x 6.2 cm on the right margin of the sigmoid colon.  On the left side the sigmoid colon there is a 1.1 x 3.4 x 2.1 cm abscess.  This abuts the dome of the bladder.  There is surrounding  inflammatory stranding.  EDP contacted Dr Paola with general surgery who requested the patient be admitted to the hospital service.  General surgery will consult. Significant Events: Admitted 10/01/2024 acute sigmoid diverticulitis with multiple abscesses   Admission Labs: White count of 19.8, hemoglobin 10.9, platelets of 455 Sodium 127, Tessman 3.8, chloride of 89, bicarb 25, BUN of 12, creatinine 0.67, glucose 117, calcium 8.8 Total protein 6.5, BUN 3.4, AST is 65, ALT of 71, alk phos of 159, total bili 0.4 Lipase normal at 21 UA showed specific gravity 1.046, pH of 7.0, ketones 15, protein 100, nitrate negative, leukocyte esterase negative  Admission Imaging Studies: CT abdomen pelvis with IV contrast demonstrated sigmoid diverticulitis with adjacent abscesses.  2 abscesses.  First 1 measuring 5.4 x 3.8 x 6.2 cm on the right margin of the sigmoid colon.  On the left side the sigmoid colon there is a 1.1 x 3.4 x 2.1 cm abscess.  This abuts the dome of the bladder.  There is surrounding inflammatory stranding.  Significant Labs:   Significant Imaging Studies:   Antibiotic Therapy: Anti-infectives (From admission, onward)    Start     Dose/Rate Route Frequency Ordered Stop   10/02/24 0930  piperacillin -tazobactam (ZOSYN ) IVPB 3.375 g        3.375 g 12.5 mL/hr over 240 Minutes Intravenous Every 8 hours 10/02/24 9076  10/01/24 2015  piperacillin -tazobactam (ZOSYN ) IVPB 3.375 g        3.375 g 100 mL/hr over 30 Minutes Intravenous  Once 10/01/24 2005 10/01/24 2056       Procedures:   Consultants: Dr. Paola with general surgery

## 2024-10-02 NOTE — Assessment & Plan Note (Signed)
 10/02/2024  Continue with her Depakote .  She has been on this for very long time.  I think her hyponatremia is likely due to volume loss from her diarrhea this week rather than from Depakote .

## 2024-10-02 NOTE — ED Notes (Signed)
 Called George at [facility] for transport 12:51-TC

## 2024-10-02 NOTE — Assessment & Plan Note (Signed)
 10/02/2024 check TSH and free T4.  I think this is hypovolemic hyponatremia.  Continue with IV fluids with normal saline.  If her hyponatremia persists tomorrow, can check urine electrolytes as well as serum osmolality and urine osmolality.  Repeat CMP in the morning.

## 2024-10-02 NOTE — ED Notes (Signed)
 Patient placed on [facility] r/t desaturations while sleeping. Patient states no hx of OSA or CPAP use. RN aware.

## 2024-10-02 NOTE — Assessment & Plan Note (Signed)
 10/02/2024 admit to MedSurg bed.  Continue with IV Zosyn .  General surgery consulted.  Continue with IV Dilaudid  as needed.  Discussed that the patient may need either IR for abscess drain placement versus surgery for abscess draining.  She understands this.  Keep her NPO.  SCDs for DVT prophylaxis for now.

## 2024-10-02 NOTE — Consult Note (Signed)
 Consult Note  Michelle Levy [birth year]  [id number].    Requesting MD:  Dr. Eva Pore, DO  Chief Complaint/Reason for Consult: Abdominal pain/Diverticulitis with abscess  HPI:  Michelle Levy is a 68 year old female with a PMH of GAD, bipolar disorder, estrogen deficiency, hyperesthesia, hearing difficulties, and osteopenia that presented to the ED due to weakness and abdominal pain. Patient seen by [facility] physicians. Due to elevated WBC, she was advised to go to the ED for further evaluation. Patient reported that weakness began 12/12 and has increased. Patient reports abdominal pain that began 12/13. Pain of right abdomen and suprapubic area. Pain is really more discomfort and not severe. She has had associated nausea and vomiting. She does report frequent urination. Last BM was yesterday and kind of light in color ?mucoid, non-bloody. Last PO intake was yesterday.   Labs in the ED: -Lipase 21 -CMP abnormalities sodium 127, chloride 89, glucose 117, calcium 8.8, albumin 3.4, AST 65, ALT 71, Alk Phos 159 -CBC abnormalities: WBC 19.8, RBC 3.51, HGB 10.9, HCT 30.8, Platelets 455 -UA with >1.046 SG, small HGB, 15 ketones, and 100 protein  CT imaging showed: Findings consistent with sigmoid diverticulitis or colitis with adjacent abscesses; recommend surgical and/or interventional consultation for management. Small gallstone. Bladder dome wall thickening, likely reactive secondary to adjacent abscess. Calcified 2 cm uterine fibroid.  Chest xray showing no acute cardiopulmonary process.  Received Zosyn , lactated Ringer 's, Tylenol  for hydration antibiosis. General surgery has been asked to consult.  Abdominal Surgical history: cesarean section Anticoagulation: none Tobacco use: Former smoker Colonoscopy: due for one, last was 10 years ago for screening  ROS: Per HPI  Family History  Problem Relation Age of Onset   Alcohol abuse Mother    Bipolar disorder Mother     Alcohol abuse Father    Depression Sister    Bipolar disorder Sister    Breast cancer Neg Hx     History reviewed. No pertinent past medical history.  Past Surgical History:  Procedure Laterality Date   CESAREAN SECTION      Social History:  reports that she has never smoked. She has never used smokeless tobacco. She reports that she does not drink alcohol and does not use drugs.  Allergies: Allergies[1]  (Not in a hospital admission)   Blood pressure 113/62, pulse 80, temperature 98.2 F (36.8 C), temperature source Oral, resp. rate 17, height 5' 3 (1.6 m), weight 56 kg, SpO2 97%. Physical Exam:  General: Pleasant female who is laying in bed in NAD. HEENT: Head is normocephalic, atraumatic.  Sclera are noninjected.  PERRL.  Ears and nose without any masses or lesions.  Mouth is pink and moist Heart: regular, rate, and rhythm.  Palpable radial and pedal pulses bilaterally Lungs: No wheezes, rhonchi, or rales noted.  Respiratory effort nonlabored Abd: soft, mildly ttp in suprapubic abdomen without peritonitis, ND, no masses, hernias, or organomegaly MS: all 4 extremities are symmetrical with no cyanosis, clubbing, or edema. Skin: warm and dry with no masses, lesions, or rashes Neuro: Cranial nerves 2-12 grossly intact, sensation is normal throughout Psych: A&Ox3 with an appropriate affect.   Results for orders placed or performed during the hospital encounter of 10/01/24 (from the past 48 hours)  Lipase, blood     Status: None   Collection Time: 10/01/24  5:57 PM  Result Value Ref Range   Lipase 21 11 - 51 U/L    Comment: Performed at [facility], [phone]  [place], [place], [place] [address]  Comprehensive metabolic panel     Status: Abnormal   Collection Time: 10/01/24  5:57 PM  Result Value Ref Range   Sodium 127 (L) 135 - 145 mmol/L   Potassium 3.8 3.5 - 5.1 mmol/L   Chloride 89 (L) 98 - 111 mmol/L   CO2 25 22 - 32 mmol/L   Glucose, Bld 117 (H) 70  - 99 mg/dL    Comment: Glucose reference range applies only to samples taken after fasting for at least 8 hours.   BUN 12 8 - 23 mg/dL   Creatinine, Ser 9.34 0.44 - 1.00 mg/dL   Calcium 8.8 (L) 8.9 - 10.3 mg/dL   Total Protein 6.5 6.5 - 8.1 g/dL   Albumin 3.4 (L) 3.5 - 5.0 g/dL   AST 65 (H) 15 - 41 U/L   ALT 71 (H) 0 - 44 U/L   Alkaline Phosphatase 159 (H) 38 - 126 U/L   Total Bilirubin 0.4 0.0 - 1.2 mg/dL   GFR, Estimated >39 >39 mL/min    Comment: (NOTE) Calculated using the CKD-EPI Creatinine Equation (2021)    Anion gap 13 5 - 15    Comment: Performed at [facility], [address], [place], [place] [address]  CBC     Status: Abnormal   Collection Time: 10/01/24  5:57 PM  Result Value Ref Range   WBC 19.8 (H) 4.0 - 10.5 K/uL   RBC 3.51 (L) 3.87 - 5.11 MIL/uL   Hemoglobin 10.9 (L) 12.0 - 15.0 g/dL   HCT 69.1 (L) 63.9 - 53.9 %   MCV 87.7 80.0 - 100.0 fL   MCH 31.1 26.0 - 34.0 pg   MCHC 35.4 30.0 - 36.0 g/dL   RDW 87.0 88.4 - 84.4 %   Platelets 455 (H) 150 - 400 K/uL   nRBC 0.0 0.0 - 0.2 %    Comment: Performed at [facility], [address], [place], [place] [address]  Urinalysis, Routine w reflex microscopic -Urine, Clean Catch     Status: Abnormal   Collection Time: 10/01/24  8:42 PM  Result Value Ref Range   Color, Urine YELLOW YELLOW   APPearance CLEAR CLEAR   Specific Gravity, Urine >1.046 (H) 1.005 - 1.030   pH 7.0 5.0 - 8.0   Glucose, UA NEGATIVE NEGATIVE mg/dL   Hgb urine dipstick SMALL (A) NEGATIVE   Bilirubin Urine NEGATIVE NEGATIVE   Ketones, ur 15 (A) NEGATIVE mg/dL   Protein, ur 899 (A) NEGATIVE mg/dL   Nitrite NEGATIVE NEGATIVE   Leukocytes,Ua NEGATIVE NEGATIVE   RBC / HPF 6-10 0 - 5 RBC/hpf   WBC, UA 0-5 0 - 5 WBC/hpf   Bacteria, UA NONE SEEN NONE SEEN   Squamous Epithelial / HPF 0-5 0 - 5 /HPF   Mucus PRESENT     Comment: Performed at [facility], [address], [place], [place]  [address]   DG Chest 2 View Result Date: 10/01/2024 EXAM: 2 VIEW(S) XRAY OF THE CHEST 10/01/2024 07:44:00 PM COMPARISON: None available. CLINICAL HISTORY: SOB FINDINGS: LUNGS AND PLEURA: No focal pulmonary opacity. No pleural effusion. No pneumothorax. HEART AND MEDIASTINUM: Aortic atherosclerosis. No acute abnormality of the cardiac and mediastinal silhouettes. BONES AND SOFT TISSUES: Multilevel thoracic osteophytosis. IMPRESSION: 1. No acute cardiopulmonary process. 2. Aortic atherosclerosis. 3. Multilevel thoracic osteophytosis. Electronically signed by: Greig Pique MD 10/01/2024 07:56 PM EST RP Workstation: [id number]   CT ABDOMEN PELVIS W CONTRAST Result Date: 10/01/2024 EXAM: CT ABDOMEN  AND PELVIS WITH CONTRAST 10/01/2024 07:33:38 PM TECHNIQUE: CT of the abdomen and pelvis was performed with the administration of intravenous contrast. 80 mL of iohexol  (OMNIPAQUE ) 300 MG/ML solution was administered. Multiplanar reformatted images are provided for review. Automated exposure control, iterative reconstruction, and/or weight-based adjustment of the mA/kV was utilized to reduce the radiation dose to as low as reasonably achievable. COMPARISON: None available. CLINICAL HISTORY: RLQ abdominal pain; RLQ and RUQ abd ttp -- concern for malignancy vs appendicitis. FINDINGS: LOWER CHEST: No acute abnormality. LIVER: The liver is unremarkable. GALLBLADDER AND BILE DUCTS: Small gallstone is present. No biliary ductal dilatation. SPLEEN: No acute abnormality. PANCREAS: No acute abnormality. ADRENAL GLANDS: No acute abnormality. 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 There is wall thickening of the dome of the bladder. No stones in the kidneys or ureters. No hydronephrosis. No perinephric or periureteral stranding. GI AND BOWEL: Stomach appears normal. There is marked wall thickening of the sigmoid colon. There is surrounding inflammatory stranding. Enhancing fluid collection  with air which appears multiloculated seen adjacent to the right margin of the sigmoid colon measuring 5.4 x 3.8 x 6.2 cm. A smaller air fluid collection is seen along the left side of the sigmoid colon measuring 1.1 x 3.4 x 2.1 cm. This abuts the dome of the bladder. There is surrounding inflammatory stranding. There are numerous air fluid levels scattered throughout small bowel loops. There is no evidence for bowel obstruction. The appendix is not visualized. PERITONEUM AND RETROPERITONEUM: There is a small amount of free fluid in the pelvis. No gross free intraperitoneal air. VASCULATURE: Aorta is normal in caliber. LYMPH NODES: No lymphadenopathy. REPRODUCTIVE ORGANS: There is a calcified uterine fibroid in the fundal region measuring 2 cm. The ovaries are not well delineated. BONES AND SOFT TISSUES: No acute osseous abnormality. No focal soft tissue abnormality. IMPRESSION: 1. Findings consistent with sigmoid diverticulitis or colitis with adjacent abscesses; recommend surgical and/or interventional consultation for management. 2. Small gallstone. 3. Bladder dome wall thickening, likely reactive secondary to adjacent abscess . 4. Calcified 2 cm uterine fibroid. Electronically signed by: Greig Pique MD 10/01/2024 07:50 PM EST RP Workstation: [id number]      Assessment/Plan Diverticulitis/Colitis with abscess - Imaging with concerns of diverticulitis/colitis and enhancing fluid collection with air which appears multiloculated seen adjacent to the right margin of the sigmoid colon measuring 5.4 x 3.8 x 6.2 cm. A smaller air fluid collection is seen along the left side of the sigmoid colon measuring 1.1 x 3.4 x 2.1 cm. This abuts the dome of the bladder. There is surrounding inflammatory stranding. There are numerous air fluid levels scattered throughout small bowel loops. There is no evidence for bowel obstruction. -Afebrile and HD stable -Exam without concern for peritonitis -Discussed labs, imaging,  and history with patient. We reviewed options for treatment based on findings including conservative measures with IV antibiotics and bowel rest. We also discussed options to have percutaneous drain placed by IR should there be a window for placement. Consult has already been placed. Patient could also need to have repeat imaging or surgical intervention throughout the course of her treatment should her symptoms worsen or do not improve. All questions were answered to patient satisfaction.  - Agree with continued IV antibiotics. - Keep NPO for now but ok to have CLD if no procedure planned  - IR consult pending - Will continue to follow, but no indication for emergent surgical intervention at this time  Admit to medicine  FEN: NPO; IVF per primary team VTE: ok to have SQH or LMWH from surgery standpoint  ID: Zosyn    I reviewed ED provider notes, last 24 h vitals and pain scores, last 48 h intake and output, last 24 h labs and trends, and last 24 h imaging results. Discussed with IR.   This care required high  level of medical decision making.   Burnard Louder, [facility] Surgery 10/02/2024, 11:27 AM Please see Amion for pager number during day hours 7:00am-4:30pm       [1] No Known Allergies

## 2024-10-03 DIAGNOSIS — K5792 Diverticulitis of intestine, part unspecified, without perforation or abscess without bleeding: Secondary | ICD-10-CM | POA: Diagnosis not present

## 2024-10-03 DIAGNOSIS — K651 Peritoneal abscess: Secondary | ICD-10-CM | POA: Diagnosis not present

## 2024-10-03 LAB — CBC WITH DIFFERENTIAL/PLATELET
Abs Immature Granulocytes: 0.12 K/uL — ABNORMAL HIGH (ref 0.00–0.07)
Basophils Absolute: 0.1 K/uL (ref 0.0–0.1)
Basophils Relative: 1 %
Eosinophils Absolute: 0.1 K/uL (ref 0.0–0.5)
Eosinophils Relative: 1 %
HCT: 33.8 % — ABNORMAL LOW (ref 36.0–46.0)
Hemoglobin: 11.4 g/dL — ABNORMAL LOW (ref 12.0–15.0)
Immature Granulocytes: 1 %
Lymphocytes Relative: 15 %
Lymphs Abs: 1.8 K/uL (ref 0.7–4.0)
MCH: 31 pg (ref 26.0–34.0)
MCHC: 33.7 g/dL (ref 30.0–36.0)
MCV: 91.8 fL (ref 80.0–100.0)
Monocytes Absolute: 1.3 K/uL — ABNORMAL HIGH (ref 0.1–1.0)
Monocytes Relative: 11 %
Neutro Abs: 8.2 K/uL — ABNORMAL HIGH (ref 1.7–7.7)
Neutrophils Relative %: 71 %
Platelets: 542 K/uL — ABNORMAL HIGH (ref 150–400)
RBC: 3.68 MIL/uL — ABNORMAL LOW (ref 3.87–5.11)
RDW: 13.2 % (ref 11.5–15.5)
WBC: 11.6 K/uL — ABNORMAL HIGH (ref 4.0–10.5)
nRBC: 0 % (ref 0.0–0.2)

## 2024-10-03 LAB — COMPREHENSIVE METABOLIC PANEL WITH GFR
ALT: 41 U/L (ref 0–44)
AST: 23 U/L (ref 15–41)
Albumin: 2.8 g/dL — ABNORMAL LOW (ref 3.5–5.0)
Alkaline Phosphatase: 124 U/L (ref 38–126)
Anion gap: 10 (ref 5–15)
BUN: 9 mg/dL (ref 8–23)
CO2: 25 mmol/L (ref 22–32)
Calcium: 8.2 mg/dL — ABNORMAL LOW (ref 8.9–10.3)
Chloride: 97 mmol/L — ABNORMAL LOW (ref 98–111)
Creatinine, Ser: 0.74 mg/dL (ref 0.44–1.00)
GFR, Estimated: >60 mL/min (ref >60)
Glucose, Bld: 84 mg/dL (ref 70–99)
Potassium: 3.9 mmol/L (ref 3.5–5.1)
Sodium: 133 mmol/L — ABNORMAL LOW (ref 135–145)
Total Bilirubin: 0.3 mg/dL (ref 0.0–1.2)
Total Protein: 5.4 g/dL — ABNORMAL LOW (ref 6.5–8.1)

## 2024-10-03 LAB — HIV ANTIBODY (ROUTINE TESTING W REFLEX): HIV Screen 4th Generation wRfx: NONREACTIVE

## 2024-10-03 NOTE — Progress Notes (Signed)
 Progress Note     Subjective: Pt denies pain. Dysuria improving. She tolerated CLD and had some solid stool material. Ambulating. She is concerned about getting home to her dogs.   Objective: Vital signs in last 24 hours: Temp:  [97.8 F (36.6 C)-98.7 F (37.1 C)] 98.7 F (37.1 C) (12/17 0458) Pulse Rate:  [78-87] 78 (12/17 0458) Resp:  [15-18] 15 (12/16 1535) BP: (105-112)/(58-62) 105/58 (12/17 0458) SpO2:  [96 %-100 %] 97 % (12/17 0458) Last BM Date : 10/01/24  Intake/Output from previous day: 12/16 0701 - 12/17 0700 In: 1104.6 [I.V.:973.7; IV Piggyback:130.8] Out: -  Intake/Output this shift: No intake/output data recorded.  PE: General: Pleasant female who is laying in bed in NAD. Heart: regular, rate, and rhythm.  Lungs: Respiratory effort nonlabored Abd: soft, NT, mild distention, no masses, hernias, or organomegaly Psych: A&Ox3 with an appropriate affect.   Lab Results:  Recent Labs    10/01/24 1757 10/03/24 0436  WBC 19.8* 11.6*  HGB 10.9* 11.4*  HCT 30.8* 33.8*  PLT 455* 542*   BMET Recent Labs    10/01/24 1757 10/03/24 0436  NA 127* 133*  K 3.8 3.9  CL 89* 97*  CO2 25 25  GLUCOSE 117* 84  BUN 12 9  CREATININE 0.65 0.74  CALCIUM 8.8* 8.2*   PT/INR No results for input(s): LABPROT, INR in the last 72 hours. CMP     Component Value Date/Time   NA 133 (L) 10/03/2024 0436   K 3.9 10/03/2024 0436   CL 97 (L) 10/03/2024 0436   CO2 25 10/03/2024 0436   GLUCOSE 84 10/03/2024 0436   BUN 9 10/03/2024 0436   CREATININE 0.74 10/03/2024 0436   CREATININE 0.90 01/05/2016 1514   CALCIUM 8.2 (L) 10/03/2024 0436   PROT 5.4 (L) 10/03/2024 0436   ALBUMIN 2.8 (L) 10/03/2024 0436   AST 23 10/03/2024 0436   ALT 41 10/03/2024 0436   ALKPHOS 124 10/03/2024 0436   BILITOT 0.3 10/03/2024 0436   GFRNONAA >60 10/03/2024 0436   GFRNONAA 69 01/05/2016 1514   GFRAA 80 01/05/2016 1514   Lipase     Component Value Date/Time   LIPASE 21 10/01/2024  1757       Studies/Results: DG Chest 2 View Result Date: 10/01/2024 EXAM: 2 VIEW(S) XRAY OF THE CHEST 10/01/2024 07:44:00 PM COMPARISON: None available. CLINICAL HISTORY: SOB FINDINGS: LUNGS AND PLEURA: No focal pulmonary opacity. No pleural effusion. No pneumothorax. HEART AND MEDIASTINUM: Aortic atherosclerosis. No acute abnormality of the cardiac and mediastinal silhouettes. BONES AND SOFT TISSUES: Multilevel thoracic osteophytosis. IMPRESSION: 1. No acute cardiopulmonary process. 2. Aortic atherosclerosis. 3. Multilevel thoracic osteophytosis. Electronically signed by: Greig Pique MD 10/01/2024 07:56 PM EST RP Workstation: [id number]   CT ABDOMEN PELVIS W CONTRAST Result Date: 10/01/2024 EXAM: CT ABDOMEN AND PELVIS WITH CONTRAST 10/01/2024 07:33:38 PM TECHNIQUE: CT of the abdomen and pelvis was performed with the administration of intravenous contrast. 80 mL of iohexol  (OMNIPAQUE ) 300 MG/ML solution was administered. Multiplanar reformatted images are provided for review. Automated exposure control, iterative reconstruction, and/or weight-based adjustment of the mA/kV was utilized to reduce the radiation dose to as low as reasonably achievable. COMPARISON: None available. CLINICAL HISTORY: RLQ abdominal pain; RLQ and RUQ abd ttp -- concern for malignancy vs appendicitis. FINDINGS: LOWER CHEST: No acute abnormality. LIVER: The liver is unremarkable. GALLBLADDER AND BILE DUCTS: Small gallstone is present. No biliary ductal dilatation. SPLEEN: No acute abnormality. PANCREAS: No acute abnormality. ADRENAL GLANDS: No acute abnormality. 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 There is wall thickening of the dome of the bladder. No stones in the kidneys or ureters. No hydronephrosis. No perinephric or periureteral stranding. GI AND BOWEL: Stomach appears normal. There is marked wall thickening of the sigmoid colon. There is surrounding inflammatory stranding.  Enhancing fluid collection with air which appears multiloculated seen adjacent to the right margin of the sigmoid colon measuring 5.4 x 3.8 x 6.2 cm. A smaller air fluid collection is seen along the left side of the sigmoid colon measuring 1.1 x 3.4 x 2.1 cm. This abuts the dome of the bladder. There is surrounding inflammatory stranding. There are numerous air fluid levels scattered throughout small bowel loops. There is no evidence for bowel obstruction. The appendix is not visualized. PERITONEUM AND RETROPERITONEUM: There is a small amount of free fluid in the pelvis. No gross free intraperitoneal air. VASCULATURE: Aorta is normal in caliber. LYMPH NODES: No lymphadenopathy. REPRODUCTIVE ORGANS: There is a calcified uterine fibroid in the fundal region measuring 2 cm. The ovaries are not well delineated. BONES AND SOFT TISSUES: No acute osseous abnormality. No focal soft tissue abnormality. IMPRESSION: 1. Findings consistent with sigmoid diverticulitis or colitis with adjacent abscesses; recommend surgical and/or interventional consultation for management. 2. Small gallstone. 3. Bladder dome wall thickening, likely reactive secondary to adjacent abscess . 4. Calcified 2 cm uterine fibroid. Electronically signed by: Greig Pique MD 10/01/2024 07:50 PM EST RP Workstation: [id number]    Anti-infectives: Anti-infectives (From admission, onward)    Start     Dose/Rate Route Frequency Ordered Stop   10/02/24 0930  piperacillin -tazobactam (ZOSYN ) IVPB 3.375 g        3.375 g 12.5 mL/hr over 240 Minutes Intravenous Every 8 hours 10/02/24 0923     10/01/24 2015  piperacillin -tazobactam (ZOSYN ) IVPB 3.375 g        3.375 g 100 mL/hr over 30 Minutes Intravenous  Once 10/01/24 2005 10/01/24 2056        Assessment/Plan Diverticulitis/Colitis with abscess - Imaging with concerns of diverticulitis/colitis and enhancing fluid collection with air which appears multiloculated seen adjacent to the right margin of  the sigmoid colon measuring 5.4 x 3.8 x 6.2 cm. A smaller air fluid collection is seen along the left side of the sigmoid colon measuring 1.1 x 3.4 x 2.1 cm. This abuts the dome of the bladder. There is surrounding inflammatory stranding. There are numerous air fluid levels scattered throughout small bowel loops. There is no evidence for bowel obstruction. - leukocytosis improved to 11K from 19K, afebrile and HD stable - IR reviewed and no window for drain placement  - tolerating CLD and having bowel function - advance to FLD and can advance to low fiber tomorrow AM if doing well  - no indication for emergent surgical intervention at this time  FEN: FLD; IVF per primary team VTE: ok to have SQH or LMWH from surgery standpoint  ID: Zosyn  12/15>>    LOS: 1 day   I reviewed hospitalist notes, last 24 h vitals and pain scores, last 48 h intake and output, last 24 h labs and trends, and last 24 h imaging results.  This care required moderate level of medical decision making.    Burnard JONELLE Louder, [facility] Surgery 10/03/2024, 11:41 AM Please see Amion for pager number during day hours 7:00am-4:30pm

## 2024-10-03 NOTE — Plan of Care (Signed)

## 2024-10-03 NOTE — Progress Notes (Signed)
 [facility] Progress Note  Patient: Michelle Levy     FMW:[id number]  DOA: 10/01/2024   PCP: Aisha Harvey, MD       Brief hospital course: This is a 68 year old female with bipolar disorder who presents to the hospital for abdominal pain, dizziness and diarrhea.  She was found to have an elevated WBC count during her preoperative eval for colonoscopy and therefore sent to the hospital. In the ED: WBC count noted to be 19.9 Sodium 127 Mildly elevated LFTs CT of the abdomen and pelvis revealed sigmoid diverticulitis with 2 adjacent abscesses. General surgery and IR consulted.  Patient started on IV antibiotics and IV fluids and admitted to the hospital.  Subjective:  The patient states that symptoms started with nausea, abdominal bloating and poor oral intake after Thanksgiving.  She subsequently had continued poor oral intake with loose stools and abdominal abdominal pain.  On current exam, she states that abdominal pain has improved.  She continues to have a poor appetite.  Assessment and Plan: Principal Problem:   Diverticulitis of large intestine with abscess -Symptoms started about 2 weeks ago and therefore she was likely had an indolent and progressive diverticulitis resulting in abscesses - IR unable to drain abscesses due to overlying viscera preventing a proper window - WBC count improved from 19.8-11.6 - Continue IV antibiotics and liquid diet-she is clinically improving  Active Problems:     Hyponatremia, dehydration - Likely secondary to poor oral intake and diarrhea - Thyroid  function studies normal - Has improved with IV fluids    Elevated LFTs -LFTs improving    Code Status: Full Code Total time on patient care: 35 minutes DVT prophylaxis:  SCDs Start: 10/02/24 1502     Objective:   Vitals:   10/02/24 1535 10/02/24 2000 10/03/24 0458 10/03/24 1340  BP: 110/62 112/60 (!) 105/58 107/67  Pulse: 87 78 78 81  Resp: 15   15  Temp: 97.8 F (36.6  C) 98.1 F (36.7 C) 98.7 F (37.1 C) 98 F (36.7 C)  TempSrc: Oral Oral Oral Oral  SpO2: 100% 96% 97% 98%  Weight:      Height:       Filed Weights   10/02/24 0847  Weight: 56 kg   Exam: General exam: Appears comfortable  HEENT: oral mucosa moist Respiratory system: Clear to auscultation.  Cardiovascular system: S1 & S2 heard  Gastrointestinal system: Abdomen soft, non-tender, nondistended. Normal bowel sounds   Extremities: No cyanosis, clubbing or edema Psychiatry:  Mood & affect appropriate.      CBC: Recent Labs  Lab 10/01/24 1757 10/03/24 0436  WBC 19.8* 11.6*  NEUTROABS  --  8.2*  HGB 10.9* 11.4*  HCT 30.8* 33.8*  MCV 87.7 91.8  PLT 455* 542*   Basic Metabolic Panel: Recent Labs  Lab 10/01/24 1757 10/03/24 0436  NA 127* 133*  K 3.8 3.9  CL 89* 97*  CO2 25 25  GLUCOSE 117* 84  BUN 12 9  CREATININE 0.65 0.74  CALCIUM 8.8* 8.2*     Scheduled Meds:  divalproex   750 mg Oral QHS    Imaging and lab data personally reviewed   Author: [employer]  10/03/2024 3:01 PM  To contact [facility]>   Check the care team in [facility] and look for the attending/consulting [facility] provider listed  Log into [email or web address] and use [facility] universal password   Go to> [facility]  and find provider  If you still have difficulty reaching the provider, please  page the [facility] (Director on Call) for the Hospitalists listed on amion

## 2024-10-04 DIAGNOSIS — K651 Peritoneal abscess: Secondary | ICD-10-CM | POA: Diagnosis not present

## 2024-10-04 LAB — CBC
HCT: 30.8 % — ABNORMAL LOW (ref 36.0–46.0)
Hemoglobin: 10.4 g/dL — ABNORMAL LOW (ref 12.0–15.0)
MCH: 30.5 pg (ref 26.0–34.0)
MCHC: 33.8 g/dL (ref 30.0–36.0)
MCV: 90.3 fL (ref 80.0–100.0)
Platelets: 513 K/uL — ABNORMAL HIGH (ref 150–400)
RBC: 3.41 MIL/uL — ABNORMAL LOW (ref 3.87–5.11)
RDW: 13.1 % (ref 11.5–15.5)
WBC: 9.5 K/uL (ref 4.0–10.5)
nRBC: 0 % (ref 0.0–0.2)

## 2024-10-04 LAB — BASIC METABOLIC PANEL WITH GFR
Anion gap: 9 (ref 5–15)
BUN: 5 mg/dL — ABNORMAL LOW (ref 8–23)
CO2: 26 mmol/L (ref 22–32)
Calcium: 8.2 mg/dL — ABNORMAL LOW (ref 8.9–10.3)
Chloride: 99 mmol/L (ref 98–111)
Creatinine, Ser: 0.63 mg/dL (ref 0.44–1.00)
GFR, Estimated: >60 mL/min (ref >60)
Glucose, Bld: 95 mg/dL (ref 70–99)
Potassium: 4.2 mmol/L (ref 3.5–5.1)
Sodium: 134 mmol/L — ABNORMAL LOW (ref 135–145)

## 2024-10-04 NOTE — Progress Notes (Signed)
° °  Subjective/Chief Complaint: No complaints.  Feels less bloated than yesterday   Objective: Vital signs in last 24 hours: Temp:  [98 F (36.7 C)-98.4 F (36.9 C)] 98.4 F (36.9 C) (12/18 0507) Pulse Rate:  [79-81] 79 (12/18 0507) Resp:  [15-18] 18 (12/18 0507) BP: (106-107)/(67-70) 106/70 (12/18 0507) SpO2:  [96 %-98 %] 96 % (12/18 0507) Last BM Date : 10/03/24  Intake/Output from previous day: 12/17 0701 - 12/18 0700 In: 480 [P.O.:480] Out: -  Intake/Output this shift: No intake/output data recorded.  General appearance: alert and cooperative Resp: clear to auscultation bilaterally Cardio: regular rate and rhythm GI: Soft, nontender.  Less distended.  Lab Results:  Recent Labs    10/03/24 0436 10/04/24 0440  WBC 11.6* 9.5  HGB 11.4* 10.4*  HCT 33.8* 30.8*  PLT 542* 513*   BMET Recent Labs    10/03/24 0436 10/04/24 0440  NA 133* 134*  K 3.9 4.2  CL 97* 99  CO2 25 26  GLUCOSE 84 95  BUN 9 5*  CREATININE 0.74 0.63  CALCIUM 8.2* 8.2*   PT/INR No results for input(s): LABPROT, INR in the last 72 hours. ABG No results for input(s): PHART, HCO3 in the last 72 hours.  Invalid input(s): PCO2, PO2  Studies/Results: No results found.  Anti-infectives: Anti-infectives (From admission, onward)    Start     Dose/Rate Route Frequency Ordered Stop   10/02/24 0930  piperacillin -tazobactam (ZOSYN ) IVPB 3.375 g        3.375 g 12.5 mL/hr over 240 Minutes Intravenous Every 8 hours 10/02/24 0923     10/01/24 2015  piperacillin -tazobactam (ZOSYN ) IVPB 3.375 g        3.375 g 100 mL/hr over 30 Minutes Intravenous  Once 10/01/24 2005 10/01/24 2056       Assessment/Plan: s/p * No surgery found * Sigmoid diverticulitis with abscess.  Not drainable per IR Continue IV antibiotics until ileus improves Continue full liquids until ileus improves I am still hopeful that we will be able to switch her at some point in the next couple days to oral  antibiotics and work towards getting her home.  She would need a follow-up scan in a week to follow the abscess. We will follow her closely with you.  Overall she is improving  LOS: 2 days    Deward Null III 10/04/2024

## 2024-10-04 NOTE — Plan of Care (Signed)
  Problem: Pain Managment: Goal: General experience of comfort will improve and/or be controlled Outcome: Progressing   Problem: Safety: Goal: Ability to remain free from injury will improve Outcome: Progressing   Problem: Skin Integrity: Goal: Risk for impaired skin integrity will decrease Outcome: Progressing

## 2024-10-04 NOTE — Plan of Care (Signed)
  Problem: Education: Goal: Knowledge of General Education information will improve Description: Including pain rating scale, medication(s)/side effects and non-pharmacologic comfort measures Outcome: Progressing   Problem: Health Behavior/Discharge Planning: Goal: Ability to manage health-related needs will improve Outcome: Progressing   Problem: Clinical Measurements: Goal: Ability to maintain clinical measurements within normal limits will improve Outcome: Progressing Goal: Will remain free from infection Outcome: Progressing Goal: Diagnostic test results will improve Outcome: Progressing Goal: Respiratory complications will improve Outcome: Progressing Goal: Cardiovascular complication will be avoided Outcome: Progressing   Problem: Elimination: Goal: Will not experience complications related to bowel motility Outcome: Progressing Goal: Will not experience complications related to urinary retention Outcome: Progressing   Problem: Skin Integrity: Goal: Risk for impaired skin integrity will decrease Outcome: Progressing   Problem: Safety: Goal: Ability to remain free from injury will improve Outcome: Progressing

## 2024-10-04 NOTE — Progress Notes (Signed)
 Mobility Specialist Progress Note:   10/04/24 1108  Mobility  Activity Ambulated independently  Level of Assistance Independent  Assistive Device None  Distance Ambulated (ft) 400 ft  Activity Response Tolerated well  Mobility Referral Yes  Mobility visit 1 Mobility  Mobility Specialist Start Time (ACUTE ONLY) 1056  Mobility Specialist Stop Time (ACUTE ONLY) 1106  Mobility Specialist Time Calculation (min) (ACUTE ONLY) 10 min   Pt was received in bed and agreed to mobility. No complaints during ambulation. Returned to bed with all needs met. Call bell in reach.  [employer] - Mobility Specialist

## 2024-10-04 NOTE — Progress Notes (Signed)
 [facility] Progress Note  Patient: Michelle Levy     FMW:[id number]  DOA: 10/01/2024   PCP: Aisha Harvey, MD       Brief hospital course: This is a 68 year old female with bipolar disorder who presents to the hospital for abdominal pain, dizziness and diarrhea.  She was found to have an elevated WBC count during her preoperative eval for colonoscopy and therefore sent to the hospital. In the ED: WBC count noted to be 19.9 Sodium 127 Mildly elevated LFTs CT of the abdomen and pelvis revealed sigmoid diverticulitis with 2 adjacent abscesses. General surgery and IR consulted.  Patient started on IV antibiotics and IV fluids and admitted to the hospital.  Subjective:  No pain. Appetite is improving. She is having non bloody loose stools. Tolerating full liquids.   Assessment and Plan: Principal Problem:   Diverticulitis of large intestine with abscess -Symptoms started about 2 weeks ago and therefore she was likely had an indolent and progressive diverticulitis resulting in abscesses - IR unable to drain abscesses due to overlying viscera preventing a proper window - WBC count improved from 19.8-11.6- 10.4 - Continue IV antibiotics and full liquid diet-she is clinically improving  Active Problems:     Hyponatremia, dehydration - Likely secondary to poor oral intake and diarrhea - Thyroid  function studies normal - Has improved with IV fluids    Elevated LFTs -LFTs improving    Code Status: Full Code Total time on patient care: 35 minutes DVT prophylaxis:  SCDs Start: 10/02/24 1502     Objective:   Vitals:   10/03/24 0458 10/03/24 1340 10/04/24 0507 10/04/24 1215  BP: (!) 105/58 107/67 106/70 (!) 113/59  Pulse: 78 81 79 72  Resp:  15 18 17   Temp: 98.7 F (37.1 C) 98 F (36.7 C) 98.4 F (36.9 C) 98.1 F (36.7 C)  TempSrc: Oral Oral    SpO2: 97% 98% 96% 97%  Weight:      Height:       Filed Weights   10/02/24 0847  Weight: 56 kg   Exam: General  exam: Appears comfortable  HEENT: oral mucosa moist Respiratory system: Clear to auscultation.  Cardiovascular system: S1 & S2 heard  Gastrointestinal system: Abdomen soft, non-tender, nondistended. Normal bowel sounds   Extremities: No cyanosis, clubbing or edema Psychiatry:  Mood & affect appropriate.      CBC: Recent Labs  Lab 10/01/24 1757 10/03/24 0436 10/04/24 0440  WBC 19.8* 11.6* 9.5  NEUTROABS  --  8.2*  --   HGB 10.9* 11.4* 10.4*  HCT 30.8* 33.8* 30.8*  MCV 87.7 91.8 90.3  PLT 455* 542* 513*   Basic Metabolic Panel: Recent Labs  Lab 10/01/24 1757 10/03/24 0436 10/04/24 0440  NA 127* 133* 134*  K 3.8 3.9 4.2  CL 89* 97* 99  CO2 25 25 26   GLUCOSE 117* 84 95  BUN 12 9 5*  CREATININE 0.65 0.74 0.63  CALCIUM 8.8* 8.2* 8.2*     Scheduled Meds:  divalproex   750 mg Oral QHS    Imaging and lab data personally reviewed   Author: True Atlas  10/04/2024 2:44 PM  To contact [facility]>   Check the care team in [facility] and look for the attending/consulting [facility] provider listed  Log into [email or web address] and use [facility]'s universal password   Go to> [facility]  and find provider  If you still have difficulty reaching the provider, please page the [facility] (Director on Call) for the Hospitalists listed on  amion

## 2024-10-05 ENCOUNTER — Other Ambulatory Visit (HOSPITAL_COMMUNITY): Payer: Self-pay

## 2024-10-05 MED ORDER — AMOXICILLIN-POT CLAVULANATE 875-125 MG PO TABS: 1.0000 | ORAL_TABLET | Freq: Two times a day (BID) | ORAL | 1 refills | Status: DC

## 2024-10-05 MED ORDER — AMOXICILLIN-POT CLAVULANATE 875-125 MG PO TABS
1.0000 | ORAL_TABLET | Freq: Two times a day (BID) | ORAL | Status: DC
  Administered 2024-10-05: 1 via ORAL
  Filled 2024-10-05: qty 1

## 2024-10-05 MED ORDER — AMOXICILLIN-POT CLAVULANATE 875-125 MG PO TABS
1.0000 | ORAL_TABLET | Freq: Two times a day (BID) | ORAL | 1 refills | Status: AC
  Filled 2024-10-05: qty 28, 14d supply, fill #0

## 2024-10-05 NOTE — Progress Notes (Signed)
 Mobility Specialist - Progress Note   10/05/24 1148  Mobility  Activity Ambulated independently  Level of Assistance Independent  Assistive Device None  Distance Ambulated (ft) 1200 ft  Activity Response Tolerated well  Mobility Referral Yes  Mobility visit 1 Mobility  Mobility Specialist Start Time (ACUTE ONLY) 1123  Mobility Specialist Stop Time (ACUTE ONLY) 1137  Mobility Specialist Time Calculation (min) (ACUTE ONLY) 14 min   Pt agreeable to ambulate this morning. No complaints made during session. Pt returned to bed at EOS and was left with call light at side.   [place]nia EDISON Mobility Specialist Acute Rehabilitation Services 10/05/2024, 11:50 AM

## 2024-10-05 NOTE — Plan of Care (Signed)

## 2024-10-05 NOTE — Progress Notes (Signed)
 "  Progress Note     Subjective: Pt denies pain. Tolerating FLD and having BMs. Afebrile.   Objective: Vital signs in last 24 hours: Temp:  [98 F (36.7 C)-98.4 F (36.9 C)] 98 F (36.7 C) (12/19 0453) Pulse Rate:  [70-72] 70 (12/19 0453) Resp:  [16-17] 16 (12/19 0453) BP: (113-121)/(59-72) 121/61 (12/19 0453) SpO2:  [97 %-98 %] 98 % (12/19 0453) Last BM Date : 10/03/24  Intake/Output from previous day: No intake/output data recorded. Intake/Output this shift: No intake/output data recorded.  PE: General: Pleasant female who is laying in bed in NAD. Heart: regular, rate, and rhythm.  Lungs: Respiratory effort nonlabored Abd: soft, NT, mild distention, no masses, hernias, or organomegaly Psych: A&Ox3 with an appropriate affect.   Lab Results:  Recent Labs    10/03/24 0436 10/04/24 0440  WBC 11.6* 9.5  HGB 11.4* 10.4*  HCT 33.8* 30.8*  PLT 542* 513*   BMET Recent Labs    10/03/24 0436 10/04/24 0440  NA 133* 134*  K 3.9 4.2  CL 97* 99  CO2 25 26  GLUCOSE 84 95  BUN 9 5*  CREATININE 0.74 0.63  CALCIUM 8.2* 8.2*   PT/INR No results for input(s): LABPROT, INR in the last 72 hours. CMP     Component Value Date/Time   NA 134 (L) 10/04/2024 0440   K 4.2 10/04/2024 0440   CL 99 10/04/2024 0440   CO2 26 10/04/2024 0440   GLUCOSE 95 10/04/2024 0440   BUN 5 (L) 10/04/2024 0440   CREATININE 0.63 10/04/2024 0440   CREATININE 0.90 01/05/2016 1514   CALCIUM 8.2 (L) 10/04/2024 0440   PROT 5.4 (L) 10/03/2024 0436   ALBUMIN 2.8 (L) 10/03/2024 0436   AST 23 10/03/2024 0436   ALT 41 10/03/2024 0436   ALKPHOS 124 10/03/2024 0436   BILITOT 0.3 10/03/2024 0436   GFRNONAA >60 10/04/2024 0440   GFRNONAA 69 01/05/2016 1514   GFRAA 80 01/05/2016 1514   Lipase     Component Value Date/Time   LIPASE 21 10/01/2024 1757       Studies/Results: No results found.   Anti-infectives: Anti-infectives (From admission, onward)    Start     Dose/Rate Route  Frequency Ordered Stop   10/02/24 0930  piperacillin -tazobactam (ZOSYN ) IVPB 3.375 g        3.375 g 12.5 mL/hr over 240 Minutes Intravenous Every 8 hours 10/02/24 0923     10/01/24 2015  piperacillin -tazobactam (ZOSYN ) IVPB 3.375 g        3.375 g 100 mL/hr over 30 Minutes Intravenous  Once 10/01/24 2005 10/01/24 2056        Assessment/Plan Diverticulitis/Colitis with abscess - Imaging with concerns of diverticulitis/colitis and enhancing fluid collection with air which appears multiloculated seen adjacent to the right margin of the sigmoid colon measuring 5.4 x 3.8 x 6.2 cm. A smaller air fluid collection is seen along the left side of the sigmoid colon measuring 1.1 x 3.4 x 2.1 cm. This abuts the dome of the bladder. There is surrounding inflammatory stranding. There are numerous air fluid levels scattered throughout small bowel loops. There is no evidence for bowel obstruction. - leukocytosis resolved 12/18, afebrile and HD stable - IR reviewed and no window for drain placement  - tolerating FLD and having bowel function - advance to low fiber diet  - will transition to PO abx today - will discuss with MD whether to watch another day on low fiber and PO abx vs possible DC  later today with outpatient CT and follow up  - no indication for emergent surgical intervention at this time  FEN: soft diet; IVF per primary team VTE: ok to have SQH or LMWH from surgery standpoint  ID: Zosyn  12/15>12/19; PO augmentin  12/19>>    LOS: 3 days   I reviewed hospitalist notes, last 24 h vitals and pain scores, last 48 h intake and output, last 24 h labs and trends, and last 24 h imaging results.  This care required moderate level of medical decision making.    Burnard JONELLE Louder, [facility] Surgery 10/05/2024, 8:40 AM Please see Amion for pager number during day hours 7:00am-4:30pm  "

## 2024-10-05 NOTE — TOC Initial Note (Signed)
 Transition of Care [facility]) - Initial/Assessment Note    Patient Details  Name: Michelle Levy MRN: [id number] Date of Birth: 27-May-1956  [facility]) CM/SW Contact:    Doneta Glenys DASEN, RN Phone Number: 10/05/2024, 10:30 AM  Clinical Narrative:                 Inpatient case manager introduced and explained role. Home; No needs identified during visit. Signing off  Expected Discharge Plan: Home/Self Care Barriers to Discharge: No Barriers Identified   Patient Goals and CMS Choice Patient states their goals for this hospitalization and ongoing recovery are:: Home with my dogs CMS Medicare.gov Compare Post Acute Care list provided to:: Patient Choice offered to / list presented to : Patient [place] ownership interest in [facility]provided to:: Patient    Expected Discharge Plan and Services In-house Referral: NA Discharge Planning Services: CM Consult Post Acute Care Choice: NA Living arrangements for the past 2 months: Single Family Home                 DME Arranged: N/A DME Agency: NA       HH Arranged: NA HH Agency: NA        Prior Living Arrangements/Services Living arrangements for the past 2 months: Single Family Home Lives with:: Self Patient language and need for interpreter reviewed:: Yes Do you feel safe going back to the place where you live?: Yes      Need for Family Participation in Patient Care: No (Comment) Care giver support system in place?: Yes (comment) Current home services:  (NA) Criminal Activity/Legal Involvement Pertinent to Current Situation/Hospitalization: No - Comment as needed  Activities of Daily Living   ADL Screening (condition at time of admission) Independently performs ADLs?: Yes (appropriate for developmental age) Is the patient deaf or have difficulty hearing?: No Does the patient have difficulty seeing, even when wearing glasses/contacts?: No Does the patient have difficulty concentrating,  remembering, or making decisions?: No  Permission Sought/Granted Permission sought to share information with : Case Manager Permission granted to share information with : Yes, Verbal Permission Granted  Share Information with NAME: Harvey Maxwell  Daughter, Emergency Contact  [phone], Norleen Darnel (Ex-husband) [phone]           Emotional Assessment Appearance:: Appears stated age Attitude/Demeanor/Rapport: Engaged Affect (typically observed): Pleasant, Appropriate Orientation: : Oriented to Self, Oriented to Place, Oriented to  Time, Oriented to Situation Alcohol / Substance Use: Not Applicable Psych Involvement: No (comment)  Admission diagnosis:  Diverticulitis [K57.92] Intra-abdominal abscess (HCC) [K65.1] Acute diverticulitis [K57.92] Patient Active Problem List   Diagnosis Date Noted   Hyponatremia 10/02/2024   Elevated LFTs 10/02/2024   Diverticulitis of large intestine with abscess 10/01/2024   Bipolar disorder, curr episode mixed, severe, w/o psychotic features (HCC) 11/24/2015   PCP:  Aisha Harvey, MD Pharmacy:   [facility] [address] [place] - [address] [place] [address] Phone: [phone] Fax: [phone]  [id number] - [place], [place] - [address] [facility] [address] [place] [address] Phone: [phone] Fax: [phone]     Social Drivers of Health (SDOH) Social History: SDOH Screenings   Food Insecurity: No Food Insecurity (10/02/2024)  Housing: Low Risk (10/02/2024)  Transportation Needs: No Transportation Needs (10/02/2024)  Utilities: Not At Risk (10/02/2024)  Alcohol Screen: Low Risk (09/07/2023)  Depression (PHQ2-9): Low Risk (05/09/2024)  Social Connections: Unknown (10/02/2024)  Tobacco Use: Low Risk (10/01/2024)   SDOH Interventions:     Readmission Risk Interventions    10/05/2024   10:28 AM  Readmission Risk  Prevention Plan  Post Dischage Appt Complete  Medication Screening Complete  Transportation Screening Complete

## 2024-10-05 NOTE — Discharge Summary (Signed)
 Physician Discharge Summary  Michelle Levy FMW:[id number] DOB: 1956-08-15 DOA: 10/01/2024  PCP: Aisha Harvey, MD  Admit date: 10/01/2024 Discharge date: 10/05/2024 Discharging to: home Recommendations for Outpatient Follow-up:  Will f/u with general surgery for repeat CT  Consults:  General surgery     Discharge Diagnoses:   Principal Problem:   Diverticulitis of large intestine with abscess Active Problems:   Bipolar disorder, curr episode mixed, severe, w/o psychotic features (HCC)   Hyponatremia   Elevated LFTs   Brief hospital course: This is a 68 year old female with bipolar disorder who presents to the hospital for abdominal pain, dizziness and diarrhea.  She was found to have an elevated WBC count during her preoperative eval for colonoscopy and therefore sent to the hospital. In the ED: WBC count noted to be 19.9 Sodium 127 Mildly elevated LFTs CT of the abdomen and pelvis revealed sigmoid diverticulitis with 2 adjacent abscesses. General surgery and IR consulted.  Patient started on IV antibiotics and IV fluids and admitted to the hospital.   Subjective:  No pain. Appetite is improving. She is having non bloody loose stools. Tolerating a regular diet.    Assessment and Plan: Principal Problem:   Diverticulitis of large intestine with abscess -Symptoms started about 2 weeks ago and therefore she was likely had an indolent and progressive diverticulitis resulting in abscesses - IR unable to drain abscesses due to overlying viscera preventing a proper window - WBC count improved from 19.8-11.6- 10.4 - ok to transition to home today- she has been switched to Augmentin and will f/u with general surgery for a f/u CT scan in 1.5 wks   Active Problems:     Hyponatremia, dehydration - Likely secondary to poor oral intake and diarrhea - Thyroid  function studies normal - Has improved with IV fluids     Elevated LFTs -LFTs improving          Discharge  Instructions   Allergies as of 10/05/2024   No Known Allergies      Medication List     TAKE these medications    amoxicillin-clavulanate 875-125 MG tablet Commonly known as: AUGMENTIN Take 1 tablet by mouth every 12 (twelve) hours for 14 days.   B-complex with vitamin C tablet Take 1 tablet by mouth daily.   CALCIUM CARBONATE PO Take 250 mg by mouth 2 (two) times daily.   cholecalciferol  1000 units tablet Commonly known as: VITAMIN D  Take 1,000 Units by mouth daily.   COLLAGEN PO 1 scoop daily in coffee   Cranberry 250 MG Caps Take 250 mg by mouth daily.   DAILY PROBIOTIC SUPPLEMENT PO Take 1 tablet by mouth daily.   divalproex  250 MG 24 hr tablet Commonly known as: DEPAKOTE  ER Take 3 tablets (750 mg total) by mouth at bedtime.   FT VITAMIN K2 PO Take 100 mcg by mouth daily.   MAGNESIUM  CARBONATE PO Take 1 tablet by mouth 2 (two) times daily.   Milk Thistle 300 MG Caps Take 300 mg by mouth daily.   MULTIVITAMIN PO Take 1 tablet by mouth daily.   Triple Omega Complex Cpdr Take 1 capsule by mouth daily.        Follow-up Information     Curvin Deward MOULD, MD. Go on 11/01/2024.   Specialty: General Surgery Why: at 10:10 AM for follow up with surgeon for diverticulitis. Please arrive by 9:40 AM to get checked in and fill out any necessary paperwork. Contact information: [address] [place]  [address] [phone]                    The results of significant diagnostics from this hospitalization (including imaging, microbiology, ancillary and laboratory) are listed below for reference.    DG Chest 2 View Result Date: 10/01/2024 EXAM: 2 VIEW(S) XRAY OF THE CHEST 10/01/2024 07:44:00 PM COMPARISON: None available. CLINICAL HISTORY: SOB FINDINGS: LUNGS AND PLEURA: No focal pulmonary opacity. No pleural effusion. No pneumothorax. HEART AND MEDIASTINUM: Aortic atherosclerosis. No acute abnormality of the cardiac and mediastinal  silhouettes. BONES AND SOFT TISSUES: Multilevel thoracic osteophytosis. IMPRESSION: 1. No acute cardiopulmonary process. 2. Aortic atherosclerosis. 3. Multilevel thoracic osteophytosis. Electronically signed by: Greig Pique MD 10/01/2024 07:56 PM EST RP Workstation: [id number]   CT ABDOMEN PELVIS W CONTRAST Result Date: 10/01/2024 EXAM: CT ABDOMEN AND PELVIS WITH CONTRAST 10/01/2024 07:33:38 PM TECHNIQUE: CT of the abdomen and pelvis was performed with the administration of intravenous contrast. 80 mL of iohexol  (OMNIPAQUE ) 300 MG/ML solution was administered. Multiplanar reformatted images are provided for review. Automated exposure control, iterative reconstruction, and/or weight-based adjustment of the mA/kV was utilized to reduce the radiation dose to as low as reasonably achievable. COMPARISON: None available. CLINICAL HISTORY: RLQ abdominal pain; RLQ and RUQ abd ttp -- concern for malignancy vs appendicitis. FINDINGS: LOWER CHEST: No acute abnormality. LIVER: The liver is unremarkable. GALLBLADDER AND BILE DUCTS: Small gallstone is present. No biliary ductal dilatation. SPLEEN: No acute abnormality. PANCREAS: No acute abnormality. ADRENAL GLANDS: No acute abnormality. 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 There is wall thickening of the dome of the bladder. No stones in the kidneys or ureters. No hydronephrosis. No perinephric or periureteral stranding. GI AND BOWEL: Stomach appears normal. There is marked wall thickening of the sigmoid colon. There is surrounding inflammatory stranding. Enhancing fluid collection with air which appears multiloculated seen adjacent to the right margin of the sigmoid colon measuring 5.4 x 3.8 x 6.2 cm. A smaller air fluid collection is seen along the left side of the sigmoid colon measuring 1.1 x 3.4 x 2.1 cm. This abuts the dome of the bladder. There is surrounding inflammatory stranding. There are numerous air fluid levels  scattered throughout small bowel loops. There is no evidence for bowel obstruction. The appendix is not visualized. PERITONEUM AND RETROPERITONEUM: There is a small amount of free fluid in the pelvis. No gross free intraperitoneal air. VASCULATURE: Aorta is normal in caliber. LYMPH NODES: No lymphadenopathy. REPRODUCTIVE ORGANS: There is a calcified uterine fibroid in the fundal region measuring 2 cm. The ovaries are not well delineated. BONES AND SOFT TISSUES: No acute osseous abnormality. No focal soft tissue abnormality. IMPRESSION: 1. Findings consistent with sigmoid diverticulitis or colitis with adjacent abscesses; recommend surgical and/or interventional consultation for management. 2. Small gallstone. 3. Bladder dome wall thickening, likely reactive secondary to adjacent abscess . 4. Calcified 2 cm uterine fibroid. Electronically signed by: Greig Pique MD 10/01/2024 07:50 PM EST RP Workstation: [id number]   Labs:   Basic Metabolic Panel: Recent Labs  Lab 10/01/24 1757 10/03/24 0436 10/04/24 0440  NA 127* 133* 134*  K 3.8 3.9 4.2  CL 89* 97* 99  CO2 25 25 26   GLUCOSE 117* 84 95  BUN 12 9 5*  CREATININE 0.65 0.74 0.63  CALCIUM 8.8* 8.2* 8.2*     CBC: Recent Labs  Lab 10/01/24 1757 10/03/24 0436 10/04/24 0440  WBC 19.8* 11.6* 9.5  NEUTROABS  --  8.2*  --  HGB 10.9* 11.4* 10.4*  HCT 30.8* 33.8* 30.8*  MCV 87.7 91.8 90.3  PLT 455* 542* 513*         SIGNED:   True Atlas, MD  [facility] 10/05/2024, 1:54 PM Time taking on discharge: 50 minutes

## 2024-10-05 NOTE — Discharge Instructions (Signed)
 The outpatient CT scan should be done on 12/30, if possible. It is okay for it to be done 12/29 or 12/31 if the patient cannot go on the 30th!

## 2024-10-05 NOTE — Progress Notes (Signed)
 Discharge meds in a secure bag delivered to patient by this RN

## 2024-10-09 ENCOUNTER — Ambulatory Visit (HOSPITAL_COMMUNITY): Admitting: Licensed Clinical Social Worker

## 2024-10-17 ENCOUNTER — Ambulatory Visit (HOSPITAL_COMMUNITY)
Admission: RE | Admit: 2024-10-17 | Discharge: 2024-10-17 | Disposition: A | Discharge status: Home / Self Care | Source: Ambulatory Visit | Attending: General Surgery | Admitting: General Surgery

## 2024-10-17 DIAGNOSIS — K5792 Diverticulitis of intestine, part unspecified, without perforation or abscess without bleeding: Secondary | ICD-10-CM | POA: Insufficient documentation

## 2024-10-17 DIAGNOSIS — K651 Peritoneal abscess: Secondary | ICD-10-CM | POA: Insufficient documentation

## 2024-10-17 MED ORDER — IOHEXOL 9 MG/ML PO SOLN
1000.0000 mL | Freq: Once | ORAL | Status: AC
  Administered 2024-10-17: 1000 mL via ORAL

## 2024-10-17 MED ORDER — IOHEXOL 300 MG/ML  SOLN
100.0000 mL | Freq: Once | INTRAMUSCULAR | Status: AC | PRN
  Administered 2024-10-17: 100 mL via INTRAVENOUS

## 2024-10-28 ENCOUNTER — Emergency Department (HOSPITAL_COMMUNITY)

## 2024-10-28 ENCOUNTER — Encounter (HOSPITAL_COMMUNITY): Payer: Self-pay

## 2024-10-28 ENCOUNTER — Inpatient Hospital Stay (HOSPITAL_COMMUNITY)
Admission: EM | Admit: 2024-10-28 | Discharge: 2024-11-04 | DRG: 329 | Disposition: A | Discharge status: Home Health | Attending: Internal Medicine | Admitting: Internal Medicine

## 2024-10-28 DIAGNOSIS — K651 Peritoneal abscess: Secondary | ICD-10-CM | POA: Diagnosis present

## 2024-10-28 DIAGNOSIS — F3163 Bipolar disorder, current episode mixed, severe, without psychotic features: Secondary | ICD-10-CM | POA: Diagnosis not present

## 2024-10-28 DIAGNOSIS — K660 Peritoneal adhesions (postprocedural) (postinfection): Secondary | ICD-10-CM | POA: Diagnosis present

## 2024-10-28 DIAGNOSIS — K5939 Other megacolon: Secondary | ICD-10-CM | POA: Diagnosis present

## 2024-10-28 DIAGNOSIS — K56699 Other intestinal obstruction unspecified as to partial versus complete obstruction: Secondary | ICD-10-CM

## 2024-10-28 DIAGNOSIS — E876 Hypokalemia: Secondary | ICD-10-CM | POA: Diagnosis present

## 2024-10-28 DIAGNOSIS — K5720 Diverticulitis of large intestine with perforation and abscess without bleeding: Secondary | ICD-10-CM | POA: Diagnosis present

## 2024-10-28 DIAGNOSIS — N321 Vesicointestinal fistula: Secondary | ICD-10-CM | POA: Diagnosis present

## 2024-10-28 DIAGNOSIS — R6510 Systemic inflammatory response syndrome (SIRS) of non-infectious origin without acute organ dysfunction: Secondary | ICD-10-CM

## 2024-10-28 DIAGNOSIS — Z818 Family history of other mental and behavioral disorders: Secondary | ICD-10-CM | POA: Diagnosis not present

## 2024-10-28 DIAGNOSIS — Z79899 Other long term (current) drug therapy: Secondary | ICD-10-CM

## 2024-10-28 DIAGNOSIS — N3001 Acute cystitis with hematuria: Secondary | ICD-10-CM | POA: Diagnosis present

## 2024-10-28 DIAGNOSIS — E871 Hypo-osmolality and hyponatremia: Secondary | ICD-10-CM | POA: Diagnosis present

## 2024-10-28 DIAGNOSIS — K56609 Unspecified intestinal obstruction, unspecified as to partial versus complete obstruction: Principal | ICD-10-CM | POA: Diagnosis present

## 2024-10-28 DIAGNOSIS — F319 Bipolar disorder, unspecified: Secondary | ICD-10-CM | POA: Diagnosis present

## 2024-10-28 DIAGNOSIS — Z811 Family history of alcohol abuse and dependence: Secondary | ICD-10-CM | POA: Diagnosis not present

## 2024-10-28 DIAGNOSIS — R1084 Generalized abdominal pain: Secondary | ICD-10-CM

## 2024-10-28 DIAGNOSIS — D649 Anemia, unspecified: Secondary | ICD-10-CM | POA: Diagnosis not present

## 2024-10-28 DIAGNOSIS — Z433 Encounter for attention to colostomy: Secondary | ICD-10-CM

## 2024-10-28 HISTORY — DX: Diverticulitis of intestine, part unspecified, without perforation or abscess without bleeding: K57.92

## 2024-10-28 LAB — COMPREHENSIVE METABOLIC PANEL WITH GFR
ALT: 16 U/L (ref 0–44)
AST: 24 U/L (ref 15–41)
Albumin: 4 g/dL (ref 3.5–5.0)
Alkaline Phosphatase: 78 U/L (ref 38–126)
Anion gap: 14 (ref 5–15)
BUN: 10 mg/dL (ref 8–23)
CO2: 24 mmol/L (ref 22–32)
Calcium: 9.3 mg/dL (ref 8.9–10.3)
Chloride: 84 mmol/L — ABNORMAL LOW (ref 98–111)
Creatinine, Ser: 0.85 mg/dL (ref 0.44–1.00)
GFR, Estimated: >60 mL/min
Glucose, Bld: 114 mg/dL — ABNORMAL HIGH (ref 70–99)
Potassium: 3.5 mmol/L (ref 3.5–5.1)
Sodium: 123 mmol/L — ABNORMAL LOW (ref 135–145)
Total Bilirubin: 0.5 mg/dL (ref 0.0–1.2)
Total Protein: 6.9 g/dL (ref 6.5–8.1)

## 2024-10-28 LAB — CBC WITH DIFFERENTIAL/PLATELET
Abs Immature Granulocytes: 0.07 K/uL (ref 0.00–0.07)
Basophils Absolute: 0 K/uL (ref 0.0–0.1)
Basophils Relative: 0 %
Eosinophils Absolute: 0 K/uL (ref 0.0–0.5)
Eosinophils Relative: 0 %
HCT: 38.1 % (ref 36.0–46.0)
Hemoglobin: 13.7 g/dL (ref 12.0–15.0)
Immature Granulocytes: 1 %
Lymphocytes Relative: 13 %
Lymphs Abs: 1.7 K/uL (ref 0.7–4.0)
MCH: 30.9 pg (ref 26.0–34.0)
MCHC: 36 g/dL (ref 30.0–36.0)
MCV: 85.8 fL (ref 80.0–100.0)
Monocytes Absolute: 1 K/uL (ref 0.1–1.0)
Monocytes Relative: 8 %
Neutro Abs: 9.8 K/uL — ABNORMAL HIGH (ref 1.7–7.7)
Neutrophils Relative %: 78 %
Platelets: 460 K/uL — ABNORMAL HIGH (ref 150–400)
RBC: 4.44 MIL/uL (ref 3.87–5.11)
RDW: 13.3 % (ref 11.5–15.5)
WBC: 12.6 K/uL — ABNORMAL HIGH (ref 4.0–10.5)
nRBC: 0 % (ref 0.0–0.2)

## 2024-10-28 LAB — BASIC METABOLIC PANEL WITH GFR
Anion gap: 14 (ref 5–15)
BUN: 8 mg/dL (ref 8–23)
CO2: 20 mmol/L — ABNORMAL LOW (ref 22–32)
Calcium: 8.2 mg/dL — ABNORMAL LOW (ref 8.9–10.3)
Chloride: 87 mmol/L — ABNORMAL LOW (ref 98–111)
Creatinine, Ser: 0.64 mg/dL (ref 0.44–1.00)
GFR, Estimated: >60 mL/min
Glucose, Bld: 98 mg/dL (ref 70–99)
Potassium: 3.7 mmol/L (ref 3.5–5.1)
Sodium: 120 mmol/L — ABNORMAL LOW (ref 135–145)

## 2024-10-28 LAB — URINALYSIS, ROUTINE W REFLEX MICROSCOPIC
Bilirubin Urine: NEGATIVE
Glucose, UA: NEGATIVE mg/dL
Ketones, ur: 5 mg/dL — AB
Nitrite: NEGATIVE
Protein, ur: 30 mg/dL — AB
Specific Gravity, Urine: 1.015 (ref 1.005–1.030)
WBC, UA: >50 WBC/hpf (ref 0–5)
pH: 6 (ref 5.0–8.0)

## 2024-10-28 LAB — LIPASE, BLOOD: Lipase: 17 U/L (ref 11–51)

## 2024-10-28 MED ORDER — LACTATED RINGERS IV SOLN: INTRAVENOUS | Status: DC

## 2024-10-28 MED ORDER — PIPERACILLIN-TAZOBACTAM 3.375 G IVPB
3.3750 g | Freq: Three times a day (TID) | INTRAVENOUS | Status: DC
  Administered 2024-10-28 – 2024-10-29 (×4): 3.375 g via INTRAVENOUS
  Filled 2024-10-28 (×6): qty 50

## 2024-10-28 MED ORDER — IOHEXOL 300 MG/ML  SOLN
100.0000 mL | Freq: Once | INTRAMUSCULAR | Status: AC | PRN
  Administered 2024-10-28: 80 mL via INTRAVENOUS

## 2024-10-28 MED ORDER — PIPERACILLIN-TAZOBACTAM 3.375 G IVPB 30 MIN
3.3750 g | Freq: Once | INTRAVENOUS | Status: AC
  Administered 2024-10-28: 3.375 g via INTRAVENOUS
  Filled 2024-10-28: qty 50

## 2024-10-28 MED ORDER — ONDANSETRON HCL 4 MG/2ML IJ SOLN
4.0000 mg | Freq: Once | INTRAMUSCULAR | Status: AC
  Administered 2024-10-28: 4 mg via INTRAVENOUS
  Filled 2024-10-28: qty 2

## 2024-10-28 MED ORDER — LACTATED RINGERS IV BOLUS: 1000.0000 mL | Freq: Once | INTRAVENOUS | Status: DC

## 2024-10-28 MED ORDER — SODIUM CHLORIDE 0.9 % IV SOLN
1.0000 g | Freq: Once | INTRAVENOUS | Status: DC
  Filled 2024-10-28: qty 10

## 2024-10-28 MED ORDER — SODIUM CHLORIDE 0.9 % IV BOLUS
1000.0000 mL | Freq: Once | INTRAVENOUS | Status: AC
  Administered 2024-10-28: 1000 mL via INTRAVENOUS

## 2024-10-28 NOTE — Progress Notes (Signed)
 "  Progress Note     Subjective: Pt comes back with nausea/vomiting and increased bloating/pain compared to prior admit.  She was doing ok, but started having increasing abdominal distention.  She also started having nausea/vomiting 3-4 days ago.  She thought at first she ate some bad chicken, but the symptoms did not abate.  She denies fever/chills.  She has had some loose stools still, and small amounts of flatus still but more belching has been present.   She has had some murky urine recently, but no pneumaturia.     Objective: Vital signs in last 24 hours: Temp:  [98.1 F (36.7 C)-98.4 F (36.9 C)] 98.4 F (36.9 C) (01/11 1936) Pulse Rate:  [72-99] 80 (01/11 2200) Resp:  [15-24] 15 (01/11 2200) BP: (110-159)/(68-103) 152/80 (01/11 2200) SpO2:  [96 %-100 %] 98 % (01/11 2200) Weight:  [56 kg] 56 kg (01/11 2100)    Intake/Output from previous day: No intake/output data recorded. Intake/Output this shift: No intake/output data recorded.  PE: General: alert and oriented.  Looks uncomfortable.  Heart: regular rate and rhythm, no murmurs Lungs: Respiratory effort nonlabored. No w/r/r.  Abd: soft, distended, mild tenderness R>>L.  + bowel sounds.  No fluid wave or hepatosplenomegaly.  Ext:; warm, well perfused.     Lab Results:  Recent Labs    10/28/24 1226  WBC 12.6*  HGB 13.7  HCT 38.1  PLT 460*   BMET Recent Labs    10/28/24 1226  NA 123*  K 3.5  CL 84*  CO2 24  GLUCOSE 114*  BUN 10  CREATININE 0.85  CALCIUM  9.3   PT/INR No results for input(s): LABPROT, INR in the last 72 hours. CMP     Component Value Date/Time   NA 123 (L) 10/28/2024 1226   K 3.5 10/28/2024 1226   CL 84 (L) 10/28/2024 1226   CO2 24 10/28/2024 1226   GLUCOSE 114 (H) 10/28/2024 1226   BUN 10 10/28/2024 1226   CREATININE 0.85 10/28/2024 1226   CREATININE 0.90 01/05/2016 1514   CALCIUM  9.3 10/28/2024 1226   PROT 6.9 10/28/2024 1226   ALBUMIN  4.0 10/28/2024 1226   AST 24  10/28/2024 1226   ALT 16 10/28/2024 1226   ALKPHOS 78 10/28/2024 1226   BILITOT 0.5 10/28/2024 1226   GFRNONAA >60 10/28/2024 1226   GFRNONAA 69 01/05/2016 1514   GFRAA 80 01/05/2016 1514   Lipase     Component Value Date/Time   LIPASE 17 10/28/2024 1226       Studies/Results: DG Abd Portable 1 View Result Date: 10/28/2024 EXAM: 1 VIEW XRAY OF THE ABDOMEN 10/28/2024 07:17:00 PM COMPARISON: CT abdomen and pelvis 11/26 CLINICAL HISTORY: post NG tube insertion FINDINGS: LINES, TUBES AND DEVICES: Enteric tube in place with tip and side port below the diaphragm overlying the expected region of the stomach. BOWEL: Mild gaseous distention of small bowel and colon in the included abdomen. Hiatal hernia. SOFT TISSUES: No abnormal calcifications. BONES: No acute fracture. IMPRESSION: 1. Enteric tube in place with tip and side port below the diaphragm overlying the expected region of the stomach. 2. Mild gaseous distention of small bowel and colon in the included abdomen. Electronically signed by: Morgane Naveau MD MD 10/28/2024 07:20 PM EST RP Workstation: [id number]   CT ABDOMEN PELVIS W CONTRAST Result Date: 10/28/2024 EXAM: CT ABDOMEN AND PELVIS WITH CONTRAST 10/28/2024 06:17:07 PM TECHNIQUE: CT of the abdomen and pelvis was performed with the administration of intravenous contrast. Multiplanar reformatted images are provided  for review. Automated exposure control, iterative reconstruction, and/or weight-based adjustment of the mA/kV was utilized to reduce the radiation dose to as low as reasonably achievable. COMPARISON: 10/17/2024 and 10/01/2024 CLINICAL HISTORY: Unspecified abdominal pain, nausea, diarrhea, complicated diverticulitis with multiple intraabdominal abscesses. FINDINGS: LOWER CHEST: Right basilar discoid atelectasis. Small hiatal hernia. LIVER: The liver is unremarkable. GALLBLADDER AND BILE DUCTS: Gallbladder is unremarkable. No biliary ductal dilatation. SPLEEN: No acute abnormality.  PANCREAS: No acute abnormality. ADRENAL GLANDS: No acute abnormality. KIDNEYS, URETERS AND BLADDER: No stones in the kidneys or ureters. No hydronephrosis. No perinephric or periureteral stranding. There has developed circumferential bladder wall thickening and prominent mucosal hyperemia suggesting a developing infectious or inflammatory cystitis. The bladder, however, is decompressed. There is suggestion of a possible indirect colovesicular fistula by way of the abscess cavity best appreciated on coronal image 67 - 70, series 7. Correlation with urine culture may be helpful for confirmation. GI AND BOWEL: Stomach and small bowel were unremarkable. Appendix normal. The previously identified intra-abdominal abscesses continued to improve and now have nearly resolved with the residual abscess cavities measuring 1.7 x 2.5 cm, containing no significant drainable fluid (image 75, series 2), and 12.5 x 2.4 cm containing a punctate focus of gas (image 73, series 2). However, a large bowel obstruction has developed with gas and fluid distention of the colon proximal to the mid sigmoid colon where there is irregular transition to a decompressed sigmoid colon (image 72, series 2). While this may simply relate to edema or post inflammatory scarring, an underlying cicatricial mass is difficult to exclude on this examination. The cecum is dilated up to 9 cm in diameter. PERITONEUM AND RETROPERITONEUM: Interval development of mild ascites. No free intraperitoneal gas or free air. VASCULATURE: Aorta is normal in caliber. LYMPH NODES: No lymphadenopathy. REPRODUCTIVE ORGANS: Calcified inverted fibroid within the uterus. Pelvic organs are otherwise unremarkable. BONES AND SOFT TISSUES: Osseous structures are age appropriate. No acute bone abnormality. No lytic or blastic bone lesion. No focal soft tissue abnormality. IMPRESSION: 1. Large bowel obstruction with gas and fluid distention of the colon proximal to the mid sigmoid colon,  where there is irregular transition to a decompressed sigmoid colon, with cecal dilation up to 9 cm in diameter; an underlying cicatricial mass is difficult to exclude. Correlation with endoscopy may be helpful for further evaluation. 2. Circumferential bladder wall thickening and prominent mucosal hyperemia suggesting developing infectious or inflammatory cystitis, with suggestion of a possible indirect colovesicular fistula by way of the abscess cavity; urinalysis and urine culture may help confirm. 3. Continued resolution of the intra-abdominal abscesses with Residual intra-abdominal abscess cavities measuring 1.7 x 2.5 cm and 12.5 x 2.4 cm, the latter containing a punctate focus of gas. No residual drainable fluid component identified. 4. Interval development of mild ascites. 5. Small hiatal hernia. Electronically signed by: Dorethia Molt MD MD 10/28/2024 06:31 PM EST RP Workstation: [id number]     Anti-infectives: Anti-infectives (From admission, onward)    Start     Dose/Rate Route Frequency Ordered Stop   10/29/24 0000  piperacillin -tazobactam (ZOSYN ) IVPB 3.375 g        3.375 g 12.5 mL/hr over 240 Minutes Intravenous Every 8 hours 10/28/24 2146     10/28/24 1630  piperacillin -tazobactam (ZOSYN ) IVPB 3.375 g        3.375 g 100 mL/hr over 30 Minutes Intravenous  Once 10/28/24 1624 10/28/24 1753   10/28/24 1600  cefTRIAXone  (ROCEPHIN ) 1 g in sodium chloride  0.9 % 100 mL IVPB  Status:  Discontinued        1 g 200 mL/hr over 30 Minutes Intravenous  Once 10/28/24 1550 10/28/24 1611        Assessment/Plan Large bowel obstruction Diverticulitis/Colitis with abscess UTI and abnormal bladder on CT with question of fistula.    Resolving abscess Obstruction of colon just proximal to inflammatory changes in sigmoid  Recommend continuation of IV antibiotics NGT NPO Will likely need surgery this admission given the obstruction and level of distention.   Patient has already decompressed  some with NGT.  Hopefully will continue to decompress more to facilitate less invasive surgery.   May need urology involvement if fistula is truly present.      LOS: 0 days   I reviewed hospitalist notes, last 24 h vitals and pain scores, last 48 h intake and output, last 24 h labs and trends, and last 24 h imaging results.  This care required moderate level of medical decision making.    Jina LITTIE Nephew, MD, FACS, FSSO Surgical Oncology, General Surgery, Trauma and Critical [facility] Surgery, [place] [phone] for weekday/non holidays Check [email or web address] for coverage night/weekend/holidays under General Surgery     10/28/2024, 10:43 PM Please see Amion for pager number during day hours 7:00am-4:30pm  "

## 2024-10-28 NOTE — ED Provider Notes (Signed)
 " [place] EMERGENCY DEPARTMENT AT [facility] Provider Note   CSN: [id number] Arrival date & time: 10/28/24  1203     Patient presents with: Abdominal Pain and Nausea   Michelle Levy is a 69 y.o. female.    Abdominal Pain Associated symptoms: nausea and vomiting      70 year old female with medical history significant for alcohol use disorder, bipolar disorder, diverticulitis with intra-abdominal abscesses presenting to the emergency department with nausea, vomiting, abdominal pain.  Patient has also had loose stools.  Patient states that she has had worsening symptoms of abdominal discomfort over the past few days.  She has had persistent nausea and vomiting and has been unable to keep anything down.  Prior to Admission medications  Medication Sig Start Date End Date Taking? Authorizing Provider  B Complex-C (B-COMPLEX WITH VITAMIN C) tablet Take 1 tablet by mouth daily.   Yes [provider]  Calcium  Carbonate Antacid (CALCIUM  CARBONATE PO) Take 250 mg by mouth 2 (two) times daily.   Yes [provider]  Cholecalciferol  (VITAMIN D3) 1000 units CAPS Take 1,000 Units by mouth daily.   Yes [provider]  Cranberry 250 MG CAPS Take 250 mg by mouth daily.   Yes [provider]  divalproex  (DEPAKOTE  ER) 250 MG 24 hr tablet Take 3 tablets (750 mg total) by mouth at bedtime. 09/20/23  Yes Delsie Lynwood Morene Lavone, MD  MAGNESIUM  CITRATE PO Take 2 capsules by mouth daily.   Yes [provider]  Milk Thistle 300 MG CAPS Take 300 mg by mouth daily.   Yes [provider]  Multiple Vitamin (MULTIVITAMIN PO) Take 1 tablet by mouth daily.   Yes [provider]  Omega 3-6-9 Fatty Acids (TRIPLE OMEGA COMPLEX) CPDR Take 1 capsule by mouth daily.   Yes [provider]  Menaquinone-7 (FT VITAMIN K2 PO) Take 100 mcg by mouth daily. 09/22/23   [provider]    Allergies: Patient has no known  allergies.    Review of Systems  Gastrointestinal:  Positive for abdominal pain, nausea and vomiting.  All other systems reviewed and are negative.   Updated Vital Signs BP (!) 144/82 (BP Location: Left Arm)   Pulse 99   Temp 98.4 F (36.9 C) (Oral)   Resp 17   SpO2 98%   Physical Exam Vitals and nursing note reviewed.  Constitutional:      General: She is not in acute distress.    Appearance: She is well-developed.  HENT:     Head: Normocephalic and atraumatic.  Eyes:     Conjunctiva/sclera: Conjunctivae normal.  Cardiovascular:     Rate and Rhythm: Normal rate and regular rhythm.     Heart sounds: No murmur heard. Pulmonary:     Effort: Pulmonary effort is normal. No respiratory distress.     Breath sounds: Normal breath sounds.  Abdominal:     Palpations: Abdomen is soft.     Tenderness: There is generalized abdominal tenderness. There is guarding.  Musculoskeletal:        General: No swelling.     Cervical back: Neck supple.  Skin:    General: Skin is warm and dry.     Capillary Refill: Capillary refill takes less than 2 seconds.  Neurological:     Mental Status: She is alert.  Psychiatric:        Mood and Affect: Mood normal.     (all labs ordered are listed, but only abnormal results are  displayed) Labs Reviewed  COMPREHENSIVE METABOLIC PANEL WITH GFR - Abnormal; Notable for the following components:      Result Value   Sodium 123 (*)    Chloride 84 (*)    Glucose, Bld 114 (*)    All other components within normal limits  CBC WITH DIFFERENTIAL/PLATELET - Abnormal; Notable for the following components:   WBC 12.6 (*)    Platelets 460 (*)    Neutro Abs 9.8 (*)    All other components within normal limits  URINALYSIS, ROUTINE W REFLEX MICROSCOPIC - Abnormal; Notable for the following components:   APPearance CLOUDY (*)    Hgb urine dipstick MODERATE (*)    Ketones, ur 5 (*)    Protein, ur 30 (*)    Leukocytes,Ua LARGE (*)    Bacteria, UA MANY (*)     Non Squamous Epithelial 0-5 (*)    All other components within normal limits  CULTURE, BLOOD (ROUTINE X 2)  CULTURE, BLOOD (ROUTINE X 2)  LIPASE, BLOOD    EKG: None  Radiology: DG Abd Portable 1 View Result Date: 10/28/2024 EXAM: 1 VIEW XRAY OF THE ABDOMEN 10/28/2024 07:17:00 PM COMPARISON: CT abdomen and pelvis 11/26 CLINICAL HISTORY: post NG tube insertion FINDINGS: LINES, TUBES AND DEVICES: Enteric tube in place with tip and side port below the diaphragm overlying the expected region of the stomach. BOWEL: Mild gaseous distention of small bowel and colon in the included abdomen. Hiatal hernia. SOFT TISSUES: No abnormal calcifications. BONES: No acute fracture. IMPRESSION: 1. Enteric tube in place with tip and side port below the diaphragm overlying the expected region of the stomach. 2. Mild gaseous distention of small bowel and colon in the included abdomen. Electronically signed by: Morgane Naveau MD MD 10/28/2024 07:20 PM EST RP Workstation: [id number]   CT ABDOMEN PELVIS W CONTRAST Result Date: 10/28/2024 EXAM: CT ABDOMEN AND PELVIS WITH CONTRAST 10/28/2024 06:17:07 PM TECHNIQUE: CT of the abdomen and pelvis was performed with the administration of intravenous contrast. Multiplanar reformatted images are provided for review. Automated exposure control, iterative reconstruction, and/or weight-based adjustment of the mA/kV was utilized to reduce the radiation dose to as low as reasonably achievable. COMPARISON: 10/17/2024 and 10/01/2024 CLINICAL HISTORY: Unspecified abdominal pain, nausea, diarrhea, complicated diverticulitis with multiple intraabdominal abscesses. FINDINGS: LOWER CHEST: Right basilar discoid atelectasis. Small hiatal hernia. LIVER: The liver is unremarkable. GALLBLADDER AND BILE DUCTS: Gallbladder is unremarkable. No biliary ductal dilatation. SPLEEN: No acute abnormality. PANCREAS: No acute abnormality. ADRENAL GLANDS: No acute abnormality. KIDNEYS, URETERS AND BLADDER: No  stones in the kidneys or ureters. No hydronephrosis. No perinephric or periureteral stranding. There has developed circumferential bladder wall thickening and prominent mucosal hyperemia suggesting a developing infectious or inflammatory cystitis. The bladder, however, is decompressed. There is suggestion of a possible indirect colovesicular fistula by way of the abscess cavity best appreciated on coronal image 67 - 70, series 7. Correlation with urine culture may be helpful for confirmation. GI AND BOWEL: Stomach and small bowel were unremarkable. Appendix normal. The previously identified intra-abdominal abscesses continued to improve and now have nearly resolved with the residual abscess cavities measuring 1.7 x 2.5 cm, containing no significant drainable fluid (image 75, series 2), and 12.5 x 2.4 cm containing a punctate focus of gas (image 73, series 2). However, a large bowel obstruction has developed with gas and fluid distention of the colon proximal to the mid sigmoid colon where there is irregular transition to a decompressed sigmoid colon (image 72, series 2). While this  may simply relate to edema or post inflammatory scarring, an underlying cicatricial mass is difficult to exclude on this examination. The cecum is dilated up to 9 cm in diameter. PERITONEUM AND RETROPERITONEUM: Interval development of mild ascites. No free intraperitoneal gas or free air. VASCULATURE: Aorta is normal in caliber. LYMPH NODES: No lymphadenopathy. REPRODUCTIVE ORGANS: Calcified inverted fibroid within the uterus. Pelvic organs are otherwise unremarkable. BONES AND SOFT TISSUES: Osseous structures are age appropriate. No acute bone abnormality. No lytic or blastic bone lesion. No focal soft tissue abnormality. IMPRESSION: 1. Large bowel obstruction with gas and fluid distention of the colon proximal to the mid sigmoid colon, where there is irregular transition to a decompressed sigmoid colon, with cecal dilation up to 9 cm in  diameter; an underlying cicatricial mass is difficult to exclude. Correlation with endoscopy may be helpful for further evaluation. 2. Circumferential bladder wall thickening and prominent mucosal hyperemia suggesting developing infectious or inflammatory cystitis, with suggestion of a possible indirect colovesicular fistula by way of the abscess cavity; urinalysis and urine culture may help confirm. 3. Continued resolution of the intra-abdominal abscesses with Residual intra-abdominal abscess cavities measuring 1.7 x 2.5 cm and 12.5 x 2.4 cm, the latter containing a punctate focus of gas. No residual drainable fluid component identified. 4. Interval development of mild ascites. 5. Small hiatal hernia. Electronically signed by: Dorethia Molt MD MD 10/28/2024 06:31 PM EST RP Workstation: [id number]     .Critical Care  Performed by: Jerrol Agent, MD Authorized by: Jerrol Agent, MD   Critical care provider statement:    Critical care time (minutes):  45   Critical care was time spent personally by me on the following activities:  Development of treatment plan with patient or surrogate, discussions with consultants, evaluation of patient's response to treatment, examination of patient, ordering and review of laboratory studies, ordering and review of radiographic studies, ordering and performing treatments and interventions, pulse oximetry, re-evaluation of patient's condition and review of old charts   Care discussed with: admitting provider      Medications Ordered in the ED  ondansetron  (ZOFRAN ) injection 4 mg (4 mg Intravenous Given 10/28/24 1611)  sodium chloride  0.9 % bolus 1,000 mL (0 mLs Intravenous Stopped 10/28/24 1841)  piperacillin -tazobactam (ZOSYN ) IVPB 3.375 g (0 g Intravenous Stopped 10/28/24 1753)  iohexol  (OMNIPAQUE ) 300 MG/ML solution 100 mL (80 mLs Intravenous Contrast Given 10/28/24 1812)    Clinical Course as of 10/28/24 1943  Sun Oct 28, 2024  1550 Sodium(!): 123 [JL]  1550  WBC(!): 12.6 [JL]  1550 Pulse Rate: 98 [JL]  1553 Bacteria, UA(!): MANY [JL]  1553 WBC, UA: >50 [JL]  1553 Leukocytes,Ua(!): LARGE [JL]    Clinical Course User Index [JL] Jerrol Agent, MD                                 Medical Decision Making Amount and/or Complexity of Data Reviewed Labs: ordered. Decision-making details documented in ED Course. Radiology: ordered.  Risk Prescription drug management. Decision regarding hospitalization.     69 year old female with medical history significant for alcohol use disorder, bipolar disorder, diverticulitis with intra-abdominal abscesses presenting to the emergency department with nausea, vomiting, abdominal pain.  Patient has also had loose stools.  Patient states that she has had worsening symptoms of abdominal discomfort over the past few days.  She has had persistent nausea and vomiting and has been unable to keep anything down.  On  arrival, the patient was afebrile, borderline tachycardic heart rate 98, not tachypneic, BP 142/92, saturating her percent room air.  Patient with guarding on exam and generalized abdominal tenderness to palpation.  Concern for worsening intra-abdominal abscess, considered bowel obstruction, UTI/pyelonephritis, nephrolithiasis.  IV access was obtained the patient was administered IV antiemetics, IV Zosyn  given her history of recent diverticular abscess, IV fluids for volume resuscitation.  Labs: CBC with a leukocytosis 12.6, CMP with evidence of hyponatremia to 123.  Patient does state that she has had some intermittent confusion over the past few days.  Lipase normal, urinalysis positive for urinary tract infection with large leukocytes, greater than 50 WBCs and many bacteria present.  CT abdomen pelvis: IMPRESSION:  1. Large bowel obstruction with gas and fluid distention of the colon proximal  to the mid sigmoid colon, where there is irregular transition to a decompressed  sigmoid colon, with cecal  dilation up to 9 cm in diameter; an underlying  cicatricial mass is difficult to exclude. Correlation with endoscopy may be  helpful for further evaluation.  2. Circumferential bladder wall thickening and prominent mucosal hyperemia  suggesting developing infectious or inflammatory cystitis, with suggestion of a  possible indirect colovesicular fistula by way of the abscess cavity;  urinalysis and urine culture may help confirm.  3. Continued resolution of the intra-abdominal abscesses with Residual  intra-abdominal abscess cavities measuring 1.7 x 2.5 cm and 12.5 x 2.4 cm, the  latter containing a punctate focus of gas. No residual drainable fluid  component identified.  4. Interval development of mild ascites.  5. Small hiatal hernia.    Patient with evidence of large bowel obstruction, NG tube placed.  Patient also with evidence of bladder wall thickening, concern for UTI/cystitis, also suggestion of a possible indirect colovesicular fistula by way of abscess cavity with associated continued resolution of intra-abdominal abscesses with residual abscess cavities present.  In the setting of development of potential colovesicular fistula, general surgery was consulted.  NG tube was placed by nursing staff bedside.  Dr. Clabe of general surgery was consulted who will see the patient in consultation, did not think that gastroenterology needed to be immediately involved, recommended hospitalist admission and agreed with NG tube and symptomatic management.  XR Abd: IMPRESSION:  1. Enteric tube in place with tip and side port below the diaphragm overlying  the expected region of the stomach.  2. Mild gaseous distention of small bowel and colon in the included abdomen.    Patient updated regarding the findings on CT, hospitalist medicine consulted for admission, patient hemodynamically stable, NG tube in place at time of admission. Dr. Franky accepted the patient in admission.      Final diagnoses:  Large bowel obstruction (HCC)  Hyponatremia  Acute cystitis with hematuria  Generalized abdominal pain  Intra-abdominal abscess (HCC)  SIRS (systemic inflammatory response syndrome) [facility])    ED Discharge Orders     None          Jerrol Agent, MD 10/28/24 1943  "

## 2024-10-28 NOTE — Progress Notes (Signed)
 ED Pharmacy Antibiotic Sign Off An antibiotic consult was received from an ED provider for Zosyn  per pharmacy dosing for intra-abdominal infection. A chart review was completed to assess appropriateness.   The following one time order(s) were placed:  Zosyn  3.375g IV over 30 min   Further antibiotic and/or antibiotic pharmacy consults should be ordered by the admitting provider if indicated.   Thank you for allowing pharmacy to be a part of this patient's care.   Wanda Hasting PharmD, BCPS [facility] main pharmacy [phone] 10/28/2024 4:23 PM

## 2024-10-28 NOTE — ED Triage Notes (Signed)
 Patient reports she was recently admitted here for diverticulitis w/ abscess x 2, reports Wednesday she had bad chicken and is having abdominal pain/nausea/diarrhea. Denies fevers. I wanted to make sure I didn't introduce new bacteria. Patient is alert and oriented x 4. Airway patent, respirations even and unlabored. Skin normal, warm and dry.

## 2024-10-28 NOTE — H&P (Signed)
 " History and Physical    Michelle Levy FMW:[id number] DOB: May 19, 1956 DOA: 10/28/2024  Patient coming from: Home.  Chief Complaint: Abdominal pain nausea vomiting.  HPI: Michelle Levy is a 69 y.o. female with history of bipolar disorder who was recently admitted last month from 10/02/2024 through 10/05/2024 for diverticulitis with abscess managed conservatively with antibiotics states that since Wednesday about 4 days ago patient has been having abdominal discomfort mostly in the left lower quadrant.  Patient symptoms started after she had some chicken when patient had 2 episodes of vomiting.  She also has been having some loose stools.  She thought she may have developed gastroenteritis.  ED Course: In the ER CT scan shows features concerning for large bowel obstruction with also features concerning for colovesical fistula.  Labs show sodium of 123 creatinine 0.8 AST ALT were normal WBC 12.6.  There is concerning for UTI.  General surgery Dr. Aron was consulted.  Patient was started on empiric antibiotics and NG tube was placed admitted for further management.  Review of Systems: As per HPI, rest all negative.   Past Medical History:  Diagnosis Date   Alcohol use disorder, severe, dependence (HCC) 11/24/2015   Bereavement 11/24/2015   Bipolar affective disorder, current episode manic (HCC) 09/07/2023   Diverticulitis     Past Surgical History:  Procedure Laterality Date   CESAREAN SECTION       reports that she has never smoked. She has never used smokeless tobacco. She reports that she does not drink alcohol and does not use drugs.  Allergies[1]  Family History  Problem Relation Age of Onset   Alcohol abuse Mother    Bipolar disorder Mother    Alcohol abuse Father    Depression Sister    Bipolar disorder Sister    Breast cancer Neg Hx     Prior to Admission medications  Medication Sig Start Date End Date Taking? Authorizing Provider  B Complex-C (B-COMPLEX WITH  VITAMIN C) tablet Take 1 tablet by mouth daily.   Yes [provider]  Calcium  Carbonate Antacid (CALCIUM  CARBONATE PO) Take 250 mg by mouth 2 (two) times daily.   Yes [provider]  Cholecalciferol  (VITAMIN D3) 1000 units CAPS Take 1,000 Units by mouth daily.   Yes [provider]  Cranberry 250 MG CAPS Take 250 mg by mouth daily.   Yes [provider]  divalproex  (DEPAKOTE  ER) 250 MG 24 hr tablet Take 3 tablets (750 mg total) by mouth at bedtime. 09/20/23  Yes Delsie Lynwood Morene Lavone, MD  MAGNESIUM  CITRATE PO Take 2 capsules by mouth daily.   Yes [provider]  Milk Thistle 300 MG CAPS Take 300 mg by mouth daily.   Yes [provider]  Multiple Vitamin (MULTIVITAMIN PO) Take 1 tablet by mouth daily.   Yes [provider]  Omega 3-6-9 Fatty Acids (TRIPLE OMEGA COMPLEX) CPDR Take 1 capsule by mouth daily.   Yes [provider]  Menaquinone-7 (FT VITAMIN K2 PO) Take 100 mcg by mouth daily. 09/22/23   [provider]    Physical Exam: Constitutional: Moderately built and nourished. Vitals:   10/28/24 1910 10/28/24 1936 10/28/24 2000 10/28/24 2100  BP:  (!) 144/82 (!) 147/81 110/68  Pulse: 97 99 89 88  Resp:  17 18 18   Temp:  98.4 F (36.9 C)    TempSrc:  Oral    SpO2:  98% 97% 97%   Eyes: Anicteric no pallor. ENMT: No discharge from  the ears eyes nose or mouth. Neck: No mass felt.  No neck rigidity. Respiratory: No rhonchi or crepitations. Cardiovascular: S1-S2 heard. Abdomen: Tenderness in left lower quadrant abdomen appears distended.  Bowel sounds not appreciated. Musculoskeletal: No edema. Skin: No rash. Neurologic: Alert awake oriented to time place and person.  Moves all extremities. Psychiatric: Appears normal.  Normal affect.   Labs on Admission: I have personally reviewed following labs and imaging studies  CBC: Recent Labs  Lab 10/28/24 1226  WBC 12.6*  NEUTROABS 9.8*  HGB  13.7  HCT 38.1  MCV 85.8  PLT 460*   Basic Metabolic Panel: Recent Labs  Lab 10/28/24 1226  NA 123*  K 3.5  CL 84*  CO2 24  GLUCOSE 114*  BUN 10  CREATININE 0.85  CALCIUM  9.3   GFR: CrCl cannot be calculated (Unknown ideal weight.). Liver Function Tests: Recent Labs  Lab 10/28/24 1226  AST 24  ALT 16  ALKPHOS 78  BILITOT 0.5  PROT 6.9  ALBUMIN  4.0   Recent Labs  Lab 10/28/24 1226  LIPASE 17   No results for input(s): AMMONIA in the last 168 hours. Coagulation Profile: No results for input(s): INR, PROTIME in the last 168 hours. Cardiac Enzymes: No results for input(s): CKTOTAL, CKMB, CKMBINDEX, TROPONINI in the last 168 hours. BNP (last 3 results) No results for input(s): PROBNP in the last 8760 hours. HbA1C: No results for input(s): HGBA1C in the last 72 hours. CBG: No results for input(s): GLUCAP in the last 168 hours. Lipid Profile: No results for input(s): CHOL, HDL, LDLCALC, TRIG, CHOLHDL, LDLDIRECT in the last 72 hours. Thyroid  Function Tests: No results for input(s): TSH, T4TOTAL, FREET4, T3FREE, THYROIDAB in the last 72 hours. Anemia Panel: No results for input(s): VITAMINB12, FOLATE, FERRITIN, TIBC, IRON, RETICCTPCT in the last 72 hours. Urine analysis:    Component Value Date/Time   COLORURINE YELLOW 10/28/2024 1455   APPEARANCEUR CLOUDY (A) 10/28/2024 1455   LABSPEC 1.015 10/28/2024 1455   PHURINE 6.0 10/28/2024 1455   GLUCOSEU NEGATIVE 10/28/2024 1455   HGBUR MODERATE (A) 10/28/2024 1455   BILIRUBINUR NEGATIVE 10/28/2024 1455   KETONESUR 5 (A) 10/28/2024 1455   PROTEINUR 30 (A) 10/28/2024 1455   NITRITE NEGATIVE 10/28/2024 1455   LEUKOCYTESUR LARGE (A) 10/28/2024 1455   Sepsis Labs: @LABRCNTIP (procalcitonin:4,lacticidven:4) )No results found for this or any previous visit (from the past 240 hours).   Radiological Exams on Admission: DG Abd Portable 1 View Result Date:  10/28/2024 EXAM: 1 VIEW XRAY OF THE ABDOMEN 10/28/2024 07:17:00 PM COMPARISON: CT abdomen and pelvis 11/26 CLINICAL HISTORY: post NG tube insertion FINDINGS: LINES, TUBES AND DEVICES: Enteric tube in place with tip and side port below the diaphragm overlying the expected region of the stomach. BOWEL: Mild gaseous distention of small bowel and colon in the included abdomen. Hiatal hernia. SOFT TISSUES: No abnormal calcifications. BONES: No acute fracture. IMPRESSION: 1. Enteric tube in place with tip and side port below the diaphragm overlying the expected region of the stomach. 2. Mild gaseous distention of small bowel and colon in the included abdomen. Electronically signed by: Morgane Naveau MD MD 10/28/2024 07:20 PM EST RP Workstation: [id number]   CT ABDOMEN PELVIS W CONTRAST Result Date: 10/28/2024 EXAM: CT ABDOMEN AND PELVIS WITH CONTRAST 10/28/2024 06:17:07 PM TECHNIQUE: CT of the abdomen and pelvis was performed with the administration of intravenous contrast. Multiplanar reformatted images are provided for review. Automated exposure control, iterative reconstruction, and/or weight-based adjustment of the mA/kV was utilized to reduce  the radiation dose to as low as reasonably achievable. COMPARISON: 10/17/2024 and 10/01/2024 CLINICAL HISTORY: Unspecified abdominal pain, nausea, diarrhea, complicated diverticulitis with multiple intraabdominal abscesses. FINDINGS: LOWER CHEST: Right basilar discoid atelectasis. Small hiatal hernia. LIVER: The liver is unremarkable. GALLBLADDER AND BILE DUCTS: Gallbladder is unremarkable. No biliary ductal dilatation. SPLEEN: No acute abnormality. PANCREAS: No acute abnormality. ADRENAL GLANDS: No acute abnormality. KIDNEYS, URETERS AND BLADDER: No stones in the kidneys or ureters. No hydronephrosis. No perinephric or periureteral stranding. There has developed circumferential bladder wall thickening and prominent mucosal hyperemia suggesting a developing infectious or  inflammatory cystitis. The bladder, however, is decompressed. There is suggestion of a possible indirect colovesicular fistula by way of the abscess cavity best appreciated on coronal image 67 - 70, series 7. Correlation with urine culture may be helpful for confirmation. GI AND BOWEL: Stomach and small bowel were unremarkable. Appendix normal. The previously identified intra-abdominal abscesses continued to improve and now have nearly resolved with the residual abscess cavities measuring 1.7 x 2.5 cm, containing no significant drainable fluid (image 75, series 2), and 12.5 x 2.4 cm containing a punctate focus of gas (image 73, series 2). However, a large bowel obstruction has developed with gas and fluid distention of the colon proximal to the mid sigmoid colon where there is irregular transition to a decompressed sigmoid colon (image 72, series 2). While this may simply relate to edema or post inflammatory scarring, an underlying cicatricial mass is difficult to exclude on this examination. The cecum is dilated up to 9 cm in diameter. PERITONEUM AND RETROPERITONEUM: Interval development of mild ascites. No free intraperitoneal gas or free air. VASCULATURE: Aorta is normal in caliber. LYMPH NODES: No lymphadenopathy. REPRODUCTIVE ORGANS: Calcified inverted fibroid within the uterus. Pelvic organs are otherwise unremarkable. BONES AND SOFT TISSUES: Osseous structures are age appropriate. No acute bone abnormality. No lytic or blastic bone lesion. No focal soft tissue abnormality. IMPRESSION: 1. Large bowel obstruction with gas and fluid distention of the colon proximal to the mid sigmoid colon, where there is irregular transition to a decompressed sigmoid colon, with cecal dilation up to 9 cm in diameter; an underlying cicatricial mass is difficult to exclude. Correlation with endoscopy may be helpful for further evaluation. 2. Circumferential bladder wall thickening and prominent mucosal hyperemia suggesting  developing infectious or inflammatory cystitis, with suggestion of a possible indirect colovesicular fistula by way of the abscess cavity; urinalysis and urine culture may help confirm. 3. Continued resolution of the intra-abdominal abscesses with Residual intra-abdominal abscess cavities measuring 1.7 x 2.5 cm and 12.5 x 2.4 cm, the latter containing a punctate focus of gas. No residual drainable fluid component identified. 4. Interval development of mild ascites. 5. Small hiatal hernia. Electronically signed by: Dorethia Molt MD MD 10/28/2024 06:31 PM EST RP Workstation: [id number]    Assessment/Plan Principal Problem:   Bowel obstruction (HCC) Active Problems:   Bipolar disorder, curr episode mixed, severe, w/o psychotic features (HCC)    Large bowel obstruction -   General Surgery has been consulted.  Patient was recently admitted for diverticulitis with abscess managed conservatively.  CT scan shows irregular transition at mid sigmoid area and underlying mass is difficult to exclude.  Patient has been placed on NG tube to suction.  Antibiotics.  IV fluids.  Pain relief medications.  Further recommendation per general surgery. Hyponatremia suspect could be from hypovolemia.  Has had hyponatremia during last admission improved with gentle hydration.  Follow metabolic panel closely.  Check urine studies TSH and  cortisol. Possible colovesical fistula with possible UTI.  Await further recommendation from general surgery.  On antibiotics. Bipolar disorder on Depakote  chronically.  Since patient has bowel obstruction with severe hyponatremia will need close monitoring and more than 2 midnight stay.  DVT prophylaxis: SCDs. Code Status: Full code. Family Communication: Discussed with patient. Disposition Plan: Medical floor. Consults called: Surgery. Admission status: Inpatient.         [1] No Known Allergies  "

## 2024-10-29 ENCOUNTER — Other Ambulatory Visit: Payer: Self-pay

## 2024-10-29 DIAGNOSIS — K5720 Diverticulitis of large intestine with perforation and abscess without bleeding: Secondary | ICD-10-CM | POA: Diagnosis not present

## 2024-10-29 DIAGNOSIS — E876 Hypokalemia: Secondary | ICD-10-CM | POA: Diagnosis not present

## 2024-10-29 DIAGNOSIS — K56609 Unspecified intestinal obstruction, unspecified as to partial versus complete obstruction: Secondary | ICD-10-CM

## 2024-10-29 DIAGNOSIS — N321 Vesicointestinal fistula: Secondary | ICD-10-CM | POA: Diagnosis not present

## 2024-10-29 DIAGNOSIS — E871 Hypo-osmolality and hyponatremia: Secondary | ICD-10-CM | POA: Diagnosis not present

## 2024-10-29 DIAGNOSIS — F3163 Bipolar disorder, current episode mixed, severe, without psychotic features: Secondary | ICD-10-CM | POA: Diagnosis not present

## 2024-10-29 LAB — CBC WITH DIFFERENTIAL/PLATELET
Abs Immature Granulocytes: 0.05 K/uL (ref 0.00–0.07)
Basophils Absolute: 0.1 K/uL (ref 0.0–0.1)
Basophils Relative: 1 %
Eosinophils Absolute: 0 K/uL (ref 0.0–0.5)
Eosinophils Relative: 0 %
HCT: 33.5 % — ABNORMAL LOW (ref 36.0–46.0)
Hemoglobin: 12 g/dL (ref 12.0–15.0)
Immature Granulocytes: 1 %
Lymphocytes Relative: 13 %
Lymphs Abs: 1.4 K/uL (ref 0.7–4.0)
MCH: 30.8 pg (ref 26.0–34.0)
MCHC: 35.8 g/dL (ref 30.0–36.0)
MCV: 85.9 fL (ref 80.0–100.0)
Monocytes Absolute: 1.4 K/uL — ABNORMAL HIGH (ref 0.1–1.0)
Monocytes Relative: 13 %
Neutro Abs: 7.9 K/uL — ABNORMAL HIGH (ref 1.7–7.7)
Neutrophils Relative %: 72 %
Platelets: 394 K/uL (ref 150–400)
RBC: 3.9 MIL/uL (ref 3.87–5.11)
RDW: 13.4 % (ref 11.5–15.5)
WBC: 10.9 K/uL — ABNORMAL HIGH (ref 4.0–10.5)
nRBC: 0 % (ref 0.0–0.2)

## 2024-10-29 LAB — BASIC METABOLIC PANEL WITH GFR
Anion gap: 15 (ref 5–15)
Anion gap: 14 (ref 5–15)
Anion gap: 13 (ref 5–15)
BUN: 7 mg/dL — ABNORMAL LOW (ref 8–23)
BUN: 6 mg/dL — ABNORMAL LOW (ref 8–23)
BUN: 7 mg/dL — ABNORMAL LOW (ref 8–23)
CO2: 21 mmol/L — ABNORMAL LOW (ref 22–32)
CO2: 22 mmol/L (ref 22–32)
CO2: 21 mmol/L — ABNORMAL LOW (ref 22–32)
Calcium: 8.3 mg/dL — ABNORMAL LOW (ref 8.9–10.3)
Calcium: 8.4 mg/dL — ABNORMAL LOW (ref 8.9–10.3)
Calcium: 8.2 mg/dL — ABNORMAL LOW (ref 8.9–10.3)
Chloride: 89 mmol/L — ABNORMAL LOW (ref 98–111)
Chloride: 90 mmol/L — ABNORMAL LOW (ref 98–111)
Chloride: 92 mmol/L — ABNORMAL LOW (ref 98–111)
Creatinine, Ser: 0.67 mg/dL (ref 0.44–1.00)
Creatinine, Ser: 0.64 mg/dL (ref 0.44–1.00)
Creatinine, Ser: 0.59 mg/dL (ref 0.44–1.00)
GFR, Estimated: >60 mL/min
GFR, Estimated: >60 mL/min
GFR, Estimated: >60 mL/min
Glucose, Bld: 86 mg/dL (ref 70–99)
Glucose, Bld: 85 mg/dL (ref 70–99)
Glucose, Bld: 72 mg/dL (ref 70–99)
Potassium: 2.7 mmol/L — CL (ref 3.5–5.1)
Potassium: 2.8 mmol/L — ABNORMAL LOW (ref 3.5–5.1)
Potassium: 3.2 mmol/L — ABNORMAL LOW (ref 3.5–5.1)
Sodium: 125 mmol/L — ABNORMAL LOW (ref 135–145)
Sodium: 125 mmol/L — ABNORMAL LOW (ref 135–145)
Sodium: 126 mmol/L — ABNORMAL LOW (ref 135–145)

## 2024-10-29 LAB — HEPATIC FUNCTION PANEL
ALT: 10 U/L (ref 0–44)
AST: 17 U/L (ref 15–41)
Albumin: 3.1 g/dL — ABNORMAL LOW (ref 3.5–5.0)
Alkaline Phosphatase: 63 U/L (ref 38–126)
Bilirubin, Direct: 0.2 mg/dL (ref 0.0–0.2)
Indirect Bilirubin: 0.2 mg/dL — ABNORMAL LOW (ref 0.3–0.9)
Total Bilirubin: 0.4 mg/dL (ref 0.0–1.2)
Total Protein: 5.5 g/dL — ABNORMAL LOW (ref 6.5–8.1)

## 2024-10-29 LAB — URINALYSIS, W/ REFLEX TO CULTURE (INFECTION SUSPECTED)
Bacteria, UA: NONE SEEN
Bilirubin Urine: NEGATIVE
Glucose, UA: NEGATIVE mg/dL
Ketones, ur: 20 mg/dL — AB
Nitrite: NEGATIVE
Protein, ur: 30 mg/dL — AB
Specific Gravity, Urine: 1.03 (ref 1.005–1.030)
WBC, UA: >50 WBC/hpf (ref 0–5)
pH: 6 (ref 5.0–8.0)

## 2024-10-29 LAB — SODIUM, URINE, RANDOM: Sodium, Ur: <30 mmol/L

## 2024-10-29 LAB — MAGNESIUM
Magnesium: 7 mg/dL (ref 1.7–2.4)
Magnesium: 1.9 mg/dL (ref 1.7–2.4)

## 2024-10-29 LAB — PHOSPHORUS: Phosphorus: 3.3 mg/dL (ref 2.5–4.6)

## 2024-10-29 LAB — CORTISOL: Cortisol, Plasma: 17.9 ug/dL

## 2024-10-29 LAB — TSH: TSH: 2.96 u[IU]/mL (ref 0.350–4.500)

## 2024-10-29 LAB — OSMOLALITY, URINE: Osmolality, Ur: 377 mosm/kg (ref 300–900)

## 2024-10-29 MED ORDER — ALVIMOPAN 12 MG PO CAPS: 12.0000 mg | ORAL_CAPSULE | ORAL | Status: DC

## 2024-10-29 MED ORDER — SODIUM CHLORIDE 0.9 % IV SOLN: INTRAVENOUS | Status: AC

## 2024-10-29 MED ORDER — ENOXAPARIN SODIUM 40 MG/0.4ML IJ SOSY
40.0000 mg | PREFILLED_SYRINGE | Freq: Once | INTRAMUSCULAR | Status: AC
  Administered 2024-10-29: 40 mg via SUBCUTANEOUS
  Filled 2024-10-29: qty 0.4

## 2024-10-29 MED ORDER — POTASSIUM CHLORIDE 10 MEQ/100ML IV SOLN
10.0000 meq | INTRAVENOUS | Status: AC
  Administered 2024-10-29 (×3): 10 meq via INTRAVENOUS
  Filled 2024-10-29 (×3): qty 100

## 2024-10-29 MED ORDER — METRONIDAZOLE 500 MG PO TABS
1000.0000 mg | ORAL_TABLET | ORAL | Status: AC
  Administered 2024-10-29 (×3): 1000 mg
  Filled 2024-10-29 (×2): qty 2

## 2024-10-29 MED ORDER — POTASSIUM CHLORIDE 10 MEQ/100ML IV SOLN
10.0000 meq | INTRAVENOUS | Status: AC
  Administered 2024-10-29 (×4): 10 meq via INTRAVENOUS
  Filled 2024-10-29 (×4): qty 100

## 2024-10-29 MED ORDER — SODIUM CHLORIDE 0.9 % IV SOLN
2.0000 g | INTRAVENOUS | Status: AC
  Administered 2024-10-30: 2 g via INTRAVENOUS
  Filled 2024-10-29: qty 2

## 2024-10-29 MED ORDER — NEOMYCIN SULFATE 500 MG PO TABS
1000.0000 mg | ORAL_TABLET | ORAL | Status: AC
  Administered 2024-10-29 (×3): 1000 mg
  Filled 2024-10-29 (×3): qty 2

## 2024-10-29 NOTE — Progress Notes (Signed)
 " Progress Note   Patient: Michelle Levy FMW:[id number] DOB: 05/03/1956 DOA: 10/28/2024     1 DOS: the patient was seen and examined on 10/29/2024   Brief hospital course: Michelle Levy is a 69 y.o. female with history of bipolar disorder who was recently admitted last month from 10/02/2024 through 10/05/2024 for diverticulitis with abscess managed conservatively with antibiotics states that since Wednesday about 4 days ago patient has been having abdominal discomfort mostly in the left lower quadrant.  In the ER CT showed features concerning for large bowel obstruction and colovesical fistula.  Laboratory findings showed sodium of 123, white count 12.6, abnormal UA.  General surgery consulted.  Patient is started on empiric antibiotics, NG tube decompression admitted to [facility] service for further management evaluation.  Assessment and Plan: Large bowel obstruction Possible colovesical fistula- Seen on CT abdomen pelvis.  Irregular transition at mid sigmoid area and underlying mass is difficult to exclude. Continue current IV antibiotics, supportive, Flagyl , Zosyn .   Continue NG to low wall suction. General surgery evaluation appreciated.  Patient is scheduled for OR tomorrow with urology help.  Hyponatremia- Chronic, baseline sodium 127-130. Continue gentle IV fluids.  Trend sodium.  Hypokalemia- Potassium very low this morning 2.7, IV potassium supplementation ordered. Continue to recheck electrolytes and replete accordingly.  Hypermagnesemia: Magnesium  was 7.0 this morning, Home med rec reviewed shows she is on magnesium  citrate which is held. Repeat magnesium  this evening 1.9.  Continue to monitor.  Bipolar disorder- She  takes Depakote  at home.This is held due to her being NPO. Resume home meds when she is able to tolerate p.o. after surgery.     Out of bed to chair. Incentive spirometry. Nursing supportive care. Fall, aspiration precautions. Diet:  Diet Orders (From  admission, onward)     Start     Ordered   10/28/24 2133  Diet NPO time specified  Diet effective now        10/28/24 2133           DVT prophylaxis: SCD's Start: 10/29/24 1220 SCDs Start: 10/28/24 2132  Level of care: Telemetry   Code Status: Full Code  Subjective: Patient is seen and examined today morning.  She is lying comfortably.  NG tube with dark fluid noted.  Denies any abdominal pain, nausea.  Physical Exam: Vitals:   10/29/24 0200 10/29/24 0258 10/29/24 0536 10/29/24 1041  BP: 131/66 (!) 140/59 118/60 123/61  Pulse: 71 72 66 70  Resp: (!) 22 18 18 18   Temp:  98.2 F (36.8 C) 98.1 F (36.7 C) 98.2 F (36.8 C)  TempSrc:  Oral Oral Oral  SpO2: 98% 100% 98% 99%  Weight:  56.2 kg    Height:  5' 3 (1.6 m)      General - Elderly Caucasian thin built female, no apparent distress HEENT - PERRLA, EOMI, atraumatic head,   NG tube intact. Lung - Clear, basal rales, rhonchi, no wheezes. Heart - S1, S2 heard, no murmurs, rubs, trace pedal edema. Abdomen - Soft, non tender, bowel sounds good Neuro - Alert, awake and oriented x 3, non focal exam. Skin - Warm and dry.  Data Reviewed:      Latest Ref Rng & Units 10/29/2024    5:31 AM 10/28/2024   12:26 PM 10/04/2024    4:40 AM  CBC  WBC 4.0 - 10.5 K/uL 10.9  12.6  9.5   Hemoglobin 12.0 - 15.0 g/dL 87.9  86.2  89.5   Hematocrit 36.0 - 46.0 %  33.5  38.1  30.8   Platelets 150 - 400 K/uL 394  460  513       Latest Ref Rng & Units 10/29/2024    9:47 AM 10/29/2024    5:31 AM 10/28/2024   10:15 PM  BMP  Glucose 70 - 99 mg/dL 85  86  98   BUN 8 - 23 mg/dL 6  7  8    Creatinine 0.44 - 1.00 mg/dL 9.35  9.32  9.35   Sodium 135 - 145 mmol/L 125  125  120   Potassium 3.5 - 5.1 mmol/L 2.8  2.7  3.7   Chloride 98 - 111 mmol/L 90  89  87   CO2 22 - 32 mmol/L 22  21  20    Calcium  8.9 - 10.3 mg/dL 8.4  8.3  8.2    DG Abd Portable 1 View Result Date: 10/28/2024 EXAM: 1 VIEW XRAY OF THE ABDOMEN 10/28/2024 07:17:00 PM  COMPARISON: CT abdomen and pelvis 11/26 CLINICAL HISTORY: post NG tube insertion FINDINGS: LINES, TUBES AND DEVICES: Enteric tube in place with tip and side port below the diaphragm overlying the expected region of the stomach. BOWEL: Mild gaseous distention of small bowel and colon in the included abdomen. Hiatal hernia. SOFT TISSUES: No abnormal calcifications. BONES: No acute fracture. IMPRESSION: 1. Enteric tube in place with tip and side port below the diaphragm overlying the expected region of the stomach. 2. Mild gaseous distention of small bowel and colon in the included abdomen. Electronically signed by: Morgane Naveau MD MD 10/28/2024 07:20 PM EST RP Workstation: [id number]   CT ABDOMEN PELVIS W CONTRAST Result Date: 10/28/2024 EXAM: CT ABDOMEN AND PELVIS WITH CONTRAST 10/28/2024 06:17:07 PM TECHNIQUE: CT of the abdomen and pelvis was performed with the administration of intravenous contrast. Multiplanar reformatted images are provided for review. Automated exposure control, iterative reconstruction, and/or weight-based adjustment of the mA/kV was utilized to reduce the radiation dose to as low as reasonably achievable. COMPARISON: 10/17/2024 and 10/01/2024 CLINICAL HISTORY: Unspecified abdominal pain, nausea, diarrhea, complicated diverticulitis with multiple intraabdominal abscesses. FINDINGS: LOWER CHEST: Right basilar discoid atelectasis. Small hiatal hernia. LIVER: The liver is unremarkable. GALLBLADDER AND BILE DUCTS: Gallbladder is unremarkable. No biliary ductal dilatation. SPLEEN: No acute abnormality. PANCREAS: No acute abnormality. ADRENAL GLANDS: No acute abnormality. KIDNEYS, URETERS AND BLADDER: No stones in the kidneys or ureters. No hydronephrosis. No perinephric or periureteral stranding. There has developed circumferential bladder wall thickening and prominent mucosal hyperemia suggesting a developing infectious or inflammatory cystitis. The bladder, however, is decompressed. There is  suggestion of a possible indirect colovesicular fistula by way of the abscess cavity best appreciated on coronal image 67 - 70, series 7. Correlation with urine culture may be helpful for confirmation. GI AND BOWEL: Stomach and small bowel were unremarkable. Appendix normal. The previously identified intra-abdominal abscesses continued to improve and now have nearly resolved with the residual abscess cavities measuring 1.7 x 2.5 cm, containing no significant drainable fluid (image 75, series 2), and 12.5 x 2.4 cm containing a punctate focus of gas (image 73, series 2). However, a large bowel obstruction has developed with gas and fluid distention of the colon proximal to the mid sigmoid colon where there is irregular transition to a decompressed sigmoid colon (image 72, series 2). While this may simply relate to edema or post inflammatory scarring, an underlying cicatricial mass is difficult to exclude on this examination. The cecum is dilated up to 9 cm in diameter. PERITONEUM AND RETROPERITONEUM: Interval development  of mild ascites. No free intraperitoneal gas or free air. VASCULATURE: Aorta is normal in caliber. LYMPH NODES: No lymphadenopathy. REPRODUCTIVE ORGANS: Calcified inverted fibroid within the uterus. Pelvic organs are otherwise unremarkable. BONES AND SOFT TISSUES: Osseous structures are age appropriate. No acute bone abnormality. No lytic or blastic bone lesion. No focal soft tissue abnormality. IMPRESSION: 1. Large bowel obstruction with gas and fluid distention of the colon proximal to the mid sigmoid colon, where there is irregular transition to a decompressed sigmoid colon, with cecal dilation up to 9 cm in diameter; an underlying cicatricial mass is difficult to exclude. Correlation with endoscopy may be helpful for further evaluation. 2. Circumferential bladder wall thickening and prominent mucosal hyperemia suggesting developing infectious or inflammatory cystitis, with suggestion of a possible  indirect colovesicular fistula by way of the abscess cavity; urinalysis and urine culture may help confirm. 3. Continued resolution of the intra-abdominal abscesses with Residual intra-abdominal abscess cavities measuring 1.7 x 2.5 cm and 12.5 x 2.4 cm, the latter containing a punctate focus of gas. No residual drainable fluid component identified. 4. Interval development of mild ascites. 5. Small hiatal hernia. Electronically signed by: Dorethia Molt MD MD 10/28/2024 06:31 PM EST RP Workstation: [id number]    Family Communication: Discussed with patient, understand and agree. All questions answered.  Disposition: Status is: Inpatient Remains inpatient appropriate because: IV antibiotics, NG tube, NPO, surgical intervention.  Planned Discharge Destination: Home with Home Health     Time spent: 46 minutes  Author: Concepcion Riser, MD 10/29/2024 3:15 PM Secure chat 7am to 7pm For on call review [email or web address].    "

## 2024-10-29 NOTE — Progress Notes (Signed)
 "    Michelle Levy  June 24, 1956 [id number]  CARE TEAM:  PCP: Aisha Harvey, MD  Outpatient Care Team: Patient Care Team: Aisha Harvey, MD as PCP - General (Family Medicine)  Inpatient Treatment Team: Treatment Team:  Darci Pore, MD Ccs, Md, MD Bobbette Payor, MD Claudene Shela MATSU, NT Purgason, Vina NOVAK, RRT Seabron Ronal HERO, [facility] Herlene Isidor [place], LPN Kriste Asberry BRAVO, RN Georgina Cyndee JONETTA Reynolds, Bethany A, LCSW   This patient is a 69 y.o.female who presents today for surgical evaluation at the request of Darci Pore, MD.  Chief complaint / Reason for evaluation: Diffuse abdominal pain   Patient endorses vomiting which started Wednesday night following a meal. Thursday progressed to vomiting and what she described as loose stools with intermittent crampy abdominal pain. She denies blood in stool. Symptoms persisted throughout the weekend. She did not attempt to treat her symptoms at home. She last vomitted Friday night. Last bowel movement was Saturday which was a loose stool.    She denies any bowel movements, vomiting, flatus, hiccups or burping since her admission.  Currently she endorses intermittent crampy abdominal pain which she rates at a 6/10. She stated pain is diffuse but most notable in the right upper abdomen.   Surgical History - 2 Caesarian sections, piloidal cyst excision   She denies any cardiovascular pulmonary, immunosuppression history.She is able to ambulate well.  She denies smoking or drinking alcohol. She has no know drug allergies.  Assessment  Michelle Levy  69 y.o. female       Problem List:  Principal Problem:   Bowel obstruction (HCC) Active Problems:   Bipolar disorder, curr episode mixed, severe, w/o psychotic features (HCC)   Hyponatremia  Patient is diffusely tender on palpation, most prominently in the RUQ.  Moderate abdominal distention and firmness.     Plan:  Progressing to colon obstruction, no need  for surgery today due to hyponatremia and hypokalemia. Will proceed with surgery once electrolytes improve. Unless emergency surgery is needed.    -monitor electrolytes & replace as needed  Keep K>4, Mg>2, Phos>3  -VTE prophylaxis- SCDs.  Anticoagulation prophyllaxis SQ as appropriate  -mobilize as tolerated to help recovery.  Enlist therapies in moderate/high risk patients as appropriate  @SCGDISCUSSEDWITHUPDATEPATIENTFAMILY @  -Disposition:  2-3 days depending on electrolyte improvement and surgical recovery.  I reviewed last 24 h vitals and pain scores, last 48 h intake and output, last 24 h labs and trends, and last 24 h imaging results. I have reviewed this patient's available data, including medical history, events of note, test results, etc as part of my evaluation.  A significant portion of that time was spent in counseling.  Care during the described time interval was provided by me.  This care required moderate level of medical decision making.  10/29/2024    10/29/2024      Past Medical History:  Diagnosis Date   Alcohol use disorder, severe, dependence (HCC) 11/24/2015   Bereavement 11/24/2015   Bipolar affective disorder, current episode manic (HCC) 09/07/2023   Diverticulitis     Past Surgical History:  Procedure Laterality Date   CESAREAN SECTION      Social History   Socioeconomic History   Marital status: Single    Spouse name: Not on file   Number of children: Not on file   Years of education: Not on file   Highest education level: Not on file  Occupational History   Not on file  Tobacco Use  Smoking status: Never   Smokeless tobacco: Never  Substance and Sexual Activity   Alcohol use: No    Alcohol/week: 0.0 standard drinks of alcohol   Drug use: No   Sexual activity: Never    Birth control/protection: Post-menopausal  Other Topics Concern   Not on file  Social History Narrative   Not on file   Social Drivers of Health   Tobacco Use:  Low Risk (10/28/2024)   Patient History    Smoking Tobacco Use: Never    Smokeless Tobacco Use: Never    Passive Exposure: Not on file  Financial Resource Strain: Not on file  Food Insecurity: No Food Insecurity (10/29/2024)   Epic    Worried About Programme Researcher, Broadcasting/film/video in the Last Year: Never true    Ran Out of Food in the Last Year: Never true  Transportation Needs: No Transportation Needs (10/29/2024)   Epic    Lack of Transportation (Medical): No    Lack of Transportation (Non-Medical): No  Physical Activity: Not on file  Stress: Not on file  Social Connections: Moderately Integrated (10/29/2024)   Social Connection and Isolation Panel    Frequency of Communication with Friends and Family: Twice a week    Frequency of Social Gatherings with Friends and Family: Once a week    Attends Religious Services: More than 4 times per year    Active Member of Clubs or Organizations: Yes    Attends Banker Meetings: More than 4 times per year    Marital Status: Divorced  Intimate Partner Violence: Not At Risk (10/29/2024)   Epic    Fear of Current or Ex-Partner: No    Emotionally Abused: No    Physically Abused: No    Sexually Abused: No  Depression (PHQ2-9): Low Risk (05/09/2024)   Depression (PHQ2-9)    PHQ-2 Score: 1  Alcohol Screen: Low Risk (09/07/2023)   Alcohol Screen    Last Alcohol Screening Score (AUDIT): 5  Housing: Low Risk (10/29/2024)   Epic    Unable to Pay for Housing in the Last Year: No    Number of Times Moved in the Last Year: 0    Homeless in the Last Year: No  Utilities: Not At Risk (10/29/2024)   Epic    Threatened with loss of utilities: No  Health Literacy: Not on file    Family History  Problem Relation Age of Onset   Alcohol abuse Mother    Bipolar disorder Mother    Alcohol abuse Father    Depression Sister    Bipolar disorder Sister    Breast cancer Neg Hx     Current Facility-Administered Medications  Medication Dose Route Frequency  Provider Last Rate Last Admin   0.9 %  sodium chloride  infusion   Intravenous Continuous Franky Redia SAILOR, MD 75 mL/hr at 10/29/24 9378 Infusion Verify at 10/29/24 9378   piperacillin -tazobactam (ZOSYN ) IVPB 3.375 g  3.375 g Intravenous Q8H Franky Redia SAILOR, MD 12.5 mL/hr at 10/29/24 0801 3.375 g at 10/29/24 0801   potassium chloride  10 mEq in 100 mL IVPB  10 mEq Intravenous Q1 Hr x 4 Sreeram, Narendranath, MD 100 mL/hr at 10/29/24 1140 10 mEq at 10/29/24 1140     Allergies[1]  ROS:   All other systems reviewed & are negative except per HPI or as noted below: @scgroswnl @  BP 123/61 (BP Location: Left Arm)   Pulse 70   Temp 98.2 F (36.8 C) (Oral)   Resp 18  Ht 5' 3 (1.6 m)   Wt 56.2 kg   SpO2 99%   BMI 21.95 kg/m   Physical Exam:   GI - Mild abdominal distention and tenderness in RUQ. Bowel sounds present. Neuro - Alert and oriented X4 Pulmonary - Trachea in midline, no use of accessory muscles. Cardiac - No cyanosis, no peripheral edema   Results:   Labs: Results for orders placed or performed during the hospital encounter of 10/28/24 (from the past 48 hours)  Comprehensive metabolic panel     Status: Abnormal   Collection Time: 10/28/24 12:26 PM  Result Value Ref Range   Sodium 123 (L) 135 - 145 mmol/L   Potassium 3.5 3.5 - 5.1 mmol/L   Chloride 84 (L) 98 - 111 mmol/L   CO2 24 22 - 32 mmol/L   Glucose, Bld 114 (H) 70 - 99 mg/dL    Comment: Glucose reference range applies only to samples taken after fasting for at least 8 hours.   BUN 10 8 - 23 mg/dL   Creatinine, Ser 9.14 0.44 - 1.00 mg/dL   Calcium  9.3 8.9 - 10.3 mg/dL   Total Protein 6.9 6.5 - 8.1 g/dL   Albumin  4.0 3.5 - 5.0 g/dL   AST 24 15 - 41 U/L   ALT 16 0 - 44 U/L   Alkaline Phosphatase 78 38 - 126 U/L   Total Bilirubin 0.5 0.0 - 1.2 mg/dL   GFR, Estimated >39 >39 mL/min    Comment: (NOTE) Calculated using the CKD-EPI Creatinine Equation (2021)    Anion gap 14 5 - 15    Comment: Performed  at [facility], [address], [place], [place] [address]  Lipase, blood     Status: None   Collection Time: 10/28/24 12:26 PM  Result Value Ref Range   Lipase 17 11 - 51 U/L    Comment: Performed at [facility], [address] [place], [place] [address]  CBC with Diff     Status: Abnormal   Collection Time: 10/28/24 12:26 PM  Result Value Ref Range   WBC 12.6 (H) 4.0 - 10.5 K/uL   RBC 4.44 3.87 - 5.11 MIL/uL   Hemoglobin 13.7 12.0 - 15.0 g/dL   HCT 61.8 63.9 - 53.9 %   MCV 85.8 80.0 - 100.0 fL   MCH 30.9 26.0 - 34.0 pg   MCHC 36.0 30.0 - 36.0 g/dL   RDW 86.6 88.4 - 84.4 %   Platelets 460 (H) 150 - 400 K/uL   nRBC 0.0 0.0 - 0.2 %   Neutrophils Relative % 78 %   Neutro Abs 9.8 (H) 1.7 - 7.7 K/uL   Lymphocytes Relative 13 %   Lymphs Abs 1.7 0.7 - 4.0 K/uL   Monocytes Relative 8 %   Monocytes Absolute 1.0 0.1 - 1.0 K/uL   Eosinophils Relative 0 %   Eosinophils Absolute 0.0 0.0 - 0.5 K/uL   Basophils Relative 0 %   Basophils Absolute 0.0 0.0 - 0.1 K/uL   Immature Granulocytes 1 %   Abs Immature Granulocytes 0.07 0.00 - 0.07 K/uL    Comment: Performed at [facility], [address], [place], [place] [address]  Urinalysis, Routine w reflex microscopic -Urine, Clean Catch     Status: Abnormal   Collection Time: 10/28/24  2:55 PM  Result Value Ref Range   Color, Urine YELLOW YELLOW   APPearance CLOUDY (A) CLEAR   Specific Gravity, Urine 1.015 1.005 - 1.030  pH 6.0 5.0 - 8.0   Glucose, UA NEGATIVE NEGATIVE mg/dL   Hgb urine dipstick MODERATE (A) NEGATIVE   Bilirubin Urine NEGATIVE NEGATIVE   Ketones, ur 5 (A) NEGATIVE mg/dL   Protein, ur 30 (A) NEGATIVE mg/dL   Nitrite NEGATIVE NEGATIVE   Leukocytes,Ua LARGE (A) NEGATIVE   RBC / HPF 11-20 0 - 5 RBC/hpf   WBC, UA >50 0 - 5 WBC/hpf   Bacteria, UA MANY (A) NONE SEEN   Squamous Epithelial / HPF 0-5 0 - 5 /HPF   WBC Clumps PRESENT    Mucus PRESENT    Non Squamous  Epithelial 0-5 (A) NONE SEEN    Comment: Performed at [facility], [address] [place], [place] [address]  Blood culture (routine x 2)     Status: None (Preliminary result)   Collection Time: 10/28/24  4:01 PM   Specimen: BLOOD  Result Value Ref Range   Specimen Description      BLOOD BLOOD RIGHT HAND Performed at [facility], [address] [place], [place] [address]    Special Requests      BOTTLES DRAWN AEROBIC AND ANAEROBIC Blood Culture results may not be optimal due to an inadequate volume of blood received in culture bottles Performed at [facility], [address], [place], [place] [address]    Culture      NO GROWTH < 24 HOURS Performed at [facility], [address], [place], [place] [address]    Report Status PENDING   Basic metabolic panel     Status: Abnormal   Collection Time: 10/28/24 10:15 PM  Result Value Ref Range   Sodium 120 (L) 135 - 145 mmol/L   Potassium 3.7 3.5 - 5.1 mmol/L    Comment: HEMOLYSIS AT THIS LEVEL MAY AFFECT RESULT   Chloride 87 (L) 98 - 111 mmol/L   CO2 20 (L) 22 - 32 mmol/L   Glucose, Bld 98 70 - 99 mg/dL    Comment: Glucose reference range applies only to samples taken after fasting for at least 8 hours.   BUN 8 8 - 23 mg/dL   Creatinine, Ser 9.35 0.44 - 1.00 mg/dL   Calcium  8.2 (L) 8.9 - 10.3 mg/dL   GFR, Estimated >39 >39 mL/min    Comment: (NOTE) Calculated using the CKD-EPI Creatinine Equation (2021)    Anion gap 14 5 - 15    Comment: Performed at [facility], [address], [place], [place] [address]  Basic metabolic panel     Status: Abnormal   Collection Time: 10/29/24  5:31 AM  Result Value Ref Range   Sodium 125 (L) 135 - 145 mmol/L   Potassium 2.7 (LL) 3.5 - 5.1 mmol/L    Comment: Critical Value, Read Back and verified with NUNEZ, I RN AT [phone] ON 10/29/2024 BY XIONG, K   Chloride 89 (L) 98 - 111 mmol/L   CO2 21 (L) 22 - 32 mmol/L    Glucose, Bld 86 70 - 99 mg/dL    Comment: Glucose reference range applies only to samples taken after fasting for at least 8 hours.   BUN 7 (L) 8 - 23 mg/dL   Creatinine, Ser 9.32 0.44 - 1.00 mg/dL   Calcium  8.3 (L) 8.9 - 10.3 mg/dL   GFR, Estimated >39 >39 mL/min    Comment: (NOTE) Calculated using the CKD-EPI Creatinine Equation (2021)    Anion gap 15 5 - 15  Comment: Performed at [facility], [address] [place], [place] [address]  Hepatic function panel     Status: Abnormal   Collection Time: 10/29/24  5:31 AM  Result Value Ref Range   Total Protein 5.5 (L) 6.5 - 8.1 g/dL   Albumin  3.1 (L) 3.5 - 5.0 g/dL   AST 17 15 - 41 U/L   ALT 10 0 - 44 U/L   Alkaline Phosphatase 63 38 - 126 U/L   Total Bilirubin 0.4 0.0 - 1.2 mg/dL   Bilirubin, Direct 0.2 0.0 - 0.2 mg/dL   Indirect Bilirubin 0.2 (L) 0.3 - 0.9 mg/dL    Comment: Performed at [facility], [address] [place], [place] [address]  CBC with Differential/Platelet     Status: Abnormal   Collection Time: 10/29/24  5:31 AM  Result Value Ref Range   WBC 10.9 (H) 4.0 - 10.5 K/uL   RBC 3.90 3.87 - 5.11 MIL/uL   Hemoglobin 12.0 12.0 - 15.0 g/dL   HCT 66.4 (L) 63.9 - 53.9 %   MCV 85.9 80.0 - 100.0 fL   MCH 30.8 26.0 - 34.0 pg   MCHC 35.8 30.0 - 36.0 g/dL   RDW 86.5 88.4 - 84.4 %   Platelets 394 150 - 400 K/uL   nRBC 0.0 0.0 - 0.2 %   Neutrophils Relative % 72 %   Neutro Abs 7.9 (H) 1.7 - 7.7 K/uL   Lymphocytes Relative 13 %   Lymphs Abs 1.4 0.7 - 4.0 K/uL   Monocytes Relative 13 %   Monocytes Absolute 1.4 (H) 0.1 - 1.0 K/uL   Eosinophils Relative 0 %   Eosinophils Absolute 0.0 0.0 - 0.5 K/uL   Basophils Relative 1 %   Basophils Absolute 0.1 0.0 - 0.1 K/uL   Immature Granulocytes 1 %   Abs Immature Granulocytes 0.05 0.00 - 0.07 K/uL    Comment: Performed at [facility], [address], [place], [place] [address]  TSH     Status: None   Collection Time:  10/29/24  5:31 AM  Result Value Ref Range   TSH 2.960 0.350 - 4.500 uIU/mL    Comment: Performed at [facility], [address], [place], [place] [address]  Cortisol     Status: None   Collection Time: 10/29/24  5:31 AM  Result Value Ref Range   Cortisol, Plasma 17.9 ug/dL    Comment: (NOTE) AM    6.7 - 22.6 ug/dL PM   <89.9       ug/dL Performed at [facility], [address], [place], [place] [address]   Urinalysis, w/ Reflex to Culture (Infection Suspected) -Urine, Clean Catch     Status: Abnormal   Collection Time: 10/29/24  9:41 AM  Result Value Ref Range   Specimen Source URINE, CLEAN CATCH    Color, Urine YELLOW YELLOW   APPearance HAZY (A) CLEAR   Specific Gravity, Urine 1.030 1.005 - 1.030   pH 6.0 5.0 - 8.0   Glucose, UA NEGATIVE NEGATIVE mg/dL   Hgb urine dipstick SMALL (A) NEGATIVE   Bilirubin Urine NEGATIVE NEGATIVE   Ketones, ur 20 (A) NEGATIVE mg/dL   Protein, ur 30 (A) NEGATIVE mg/dL   Nitrite NEGATIVE NEGATIVE   Leukocytes,Ua LARGE (A) NEGATIVE   RBC / HPF 0-5 0 - 5 RBC/hpf   WBC, UA >50 0 - 5 WBC/hpf    Comment:        Reflex urine  culture not performed if WBC <=10, OR if Squamous epithelial cells >5. If Squamous epithelial cells >5 suggest recollection.    Bacteria, UA NONE SEEN NONE SEEN   Squamous Epithelial / HPF 0-5 0 - 5 /HPF   Mucus PRESENT     Comment: Performed at [facility], [address] [place], [place] [address]  Basic metabolic panel     Status: Abnormal   Collection Time: 10/29/24  9:47 AM  Result Value Ref Range   Sodium 125 (L) 135 - 145 mmol/L   Potassium 2.8 (L) 3.5 - 5.1 mmol/L   Chloride 90 (L) 98 - 111 mmol/L   CO2 22 22 - 32 mmol/L   Glucose, Bld 85 70 - 99 mg/dL    Comment: Glucose reference range applies only to samples taken after fasting for at least 8 hours.   BUN 6 (L) 8 - 23 mg/dL   Creatinine, Ser 9.35 0.44 - 1.00 mg/dL   Calcium  8.4 (L) 8.9 - 10.3 mg/dL   GFR,  Estimated >39 >39 mL/min    Comment: (NOTE) Calculated using the CKD-EPI Creatinine Equation (2021)    Anion gap 14 5 - 15    Comment: Performed at [facility], [address] [place], [place] [address]  Magnesium      Status: Abnormal   Collection Time: 10/29/24  9:47 AM  Result Value Ref Range   Magnesium  7.0 (HH) 1.7 - 2.4 mg/dL    Comment: Critical Value, Read Back and verified with VERDON, C RN AT 1100 ON 10/29/2024 BY PRUDY POUR Performed at [facility], [address] [place], [place] [address]   Phosphorus     Status: None   Collection Time: 10/29/24  9:47 AM  Result Value Ref Range   Phosphorus 3.3 2.5 - 4.6 mg/dL    Comment: Performed at [facility], [address] [place], [place] [address]  Sodium, urine, random     Status: None   Collection Time: 10/29/24  9:48 AM  Result Value Ref Range   Sodium, Ur <30 mmol/L    Comment: NO NORMAL RANGE ESTABLISHED FOR THIS TEST Performed at [facility], [address], [place], [place] [address]     Imaging / Studies: DG Abd Portable 1 View Result Date: 10/28/2024 EXAM: 1 VIEW XRAY OF THE ABDOMEN 10/28/2024 07:17:00 PM COMPARISON: CT abdomen and pelvis 11/26 CLINICAL HISTORY: post NG tube insertion FINDINGS: LINES, TUBES AND DEVICES: Enteric tube in place with tip and side port below the diaphragm overlying the expected region of the stomach. BOWEL: Mild gaseous distention of small bowel and colon in the included abdomen. Hiatal hernia. SOFT TISSUES: No abnormal calcifications. BONES: No acute fracture. IMPRESSION: 1. Enteric tube in place with tip and side port below the diaphragm overlying the expected region of the stomach. 2. Mild gaseous distention of small bowel and colon in the included abdomen. Electronically signed by: Morgane Naveau MD MD 10/28/2024 07:20 PM EST RP Workstation: [id number]   CT ABDOMEN PELVIS W CONTRAST Result Date:  10/28/2024 EXAM: CT ABDOMEN AND PELVIS WITH CONTRAST 10/17/2024 03:16:21 PM TECHNIQUE: CT of the abdomen and pelvis was performed with the administration of 100 mL of iohexol  (OMNIPAQUE ) 300 MG/ML solution. Multiplanar reformatted images are provided for review. Automated exposure control, iterative reconstruction, and/or weight-based adjustment of the mA/kV was utilized to reduce the radiation dose to as low as reasonably achievable. COMPARISON: 10/01/2024 CLINICAL HISTORY: Diverticulitis, complication suspected; follow up diverticulitis  with abscess not amenable to IR drainage. Perforated diverticulitis with multiple intra-abdominal abscesses. FINDINGS: LOWER CHEST: No acute abnormality. LIVER: The liver is unremarkable. GALLBLADDER AND BILE DUCTS: Gallbladder is unremarkable. No biliary ductal dilatation. SPLEEN: No acute abnormality. PANCREAS: No acute abnormality. ADRENAL GLANDS: No acute abnormality. KIDNEYS, URETERS AND BLADDER: No stones in the kidneys or ureters. No hydronephrosis. No perinephric or periureteral stranding. Small amount of nondependent gas is seen within the bladder lumen. The larger pericolonic abscess (1.8 x 3.0 cm, series 2, image 66) abuts the bladder dome. Development of an indirect colovesicular fistula by way of the abscess cavity is not excluded. Correlation with urinalysis and urine culture may be helpful for confirmation. GI AND BOWEL: There is circumferential mural thickening and submucosal edema in the mid sigmoid colon, similar to prior examination. There is infiltrative soft tissue within the adjacent sigmoid mesentery (series 2, image 60) which may reflect inflammatory tissue though infiltrative disease as can be seen with a perforated malignancy, could appear similarly. Previously noted pericolonic abscesses have significantly decreased in size, measuring 1.8 x 3.0 cm (series 2, image 66) and 2.0 x 1.1 cm (series 2, image 63). Small amount of gas and fluid is seen within the  larger collection. Proximal to the area of inflammatory change involving the mid sigmoid colon, the colon is unremarkable. Stomach and small bowel are unremarkable. There is no bowel obstruction. PERITONEUM AND RETROPERITONEUM: No ascites. No free air. VASCULATURE: Aorta is normal in caliber. LYMPH NODES: No lymphadenopathy. REPRODUCTIVE ORGANS: Calcified involuted fibroid within the uterus. Pelvic organs are otherwise unremarkable. BONES AND SOFT TISSUES: Osseous structures are age appropriate. No acute osseous abnormality. No lytic or blastic bone lesion. No focal soft tissue abnormality. IMPRESSION: 1. Findings compatible with perforated sigmoid diverticulitis, with improving pericolonic abscesses, and persistent adjacent sigmoid mesenteric soft tissue which may be inflammatory; underlying perforated malignancy is not excluded. Correlation with endoscopy may be helpful once the patient's acute issues have resolved. 2. Gas and fluid within the larger pericolonic collection abutting the bladder dome with new intravesical gas, and an indirect colovesical fistula is not excluded; consider urinalysis and urine culture. Electronically signed by: Dorethia Molt MD MD 10/28/2024 06:38 PM EST RP Workstation: [id number]   CT ABDOMEN PELVIS W CONTRAST Result Date: 10/28/2024 EXAM: CT ABDOMEN AND PELVIS WITH CONTRAST 10/28/2024 06:17:07 PM TECHNIQUE: CT of the abdomen and pelvis was performed with the administration of intravenous contrast. Multiplanar reformatted images are provided for review. Automated exposure control, iterative reconstruction, and/or weight-based adjustment of the mA/kV was utilized to reduce the radiation dose to as low as reasonably achievable. COMPARISON: 10/17/2024 and 10/01/2024 CLINICAL HISTORY: Unspecified abdominal pain, nausea, diarrhea, complicated diverticulitis with multiple intraabdominal abscesses. FINDINGS: LOWER CHEST: Right basilar discoid atelectasis. Small hiatal hernia. LIVER: The  liver is unremarkable. GALLBLADDER AND BILE DUCTS: Gallbladder is unremarkable. No biliary ductal dilatation. SPLEEN: No acute abnormality. PANCREAS: No acute abnormality. ADRENAL GLANDS: No acute abnormality. KIDNEYS, URETERS AND BLADDER: No stones in the kidneys or ureters. No hydronephrosis. No perinephric or periureteral stranding. There has developed circumferential bladder wall thickening and prominent mucosal hyperemia suggesting a developing infectious or inflammatory cystitis. The bladder, however, is decompressed. There is suggestion of a possible indirect colovesicular fistula by way of the abscess cavity best appreciated on coronal image 67 - 70, series 7. Correlation with urine culture may be helpful for confirmation. GI AND BOWEL: Stomach and small bowel were unremarkable. Appendix normal. The previously identified intra-abdominal abscesses continued to improve and now have  nearly resolved with the residual abscess cavities measuring 1.7 x 2.5 cm, containing no significant drainable fluid (image 75, series 2), and 12.5 x 2.4 cm containing a punctate focus of gas (image 73, series 2). However, a large bowel obstruction has developed with gas and fluid distention of the colon proximal to the mid sigmoid colon where there is irregular transition to a decompressed sigmoid colon (image 72, series 2). While this may simply relate to edema or post inflammatory scarring, an underlying cicatricial mass is difficult to exclude on this examination. The cecum is dilated up to 9 cm in diameter. PERITONEUM AND RETROPERITONEUM: Interval development of mild ascites. No free intraperitoneal gas or free air. VASCULATURE: Aorta is normal in caliber. LYMPH NODES: No lymphadenopathy. REPRODUCTIVE ORGANS: Calcified inverted fibroid within the uterus. Pelvic organs are otherwise unremarkable. BONES AND SOFT TISSUES: Osseous structures are age appropriate. No acute bone abnormality. No lytic or blastic bone lesion. No focal  soft tissue abnormality. IMPRESSION: 1. Large bowel obstruction with gas and fluid distention of the colon proximal to the mid sigmoid colon, where there is irregular transition to a decompressed sigmoid colon, with cecal dilation up to 9 cm in diameter; an underlying cicatricial mass is difficult to exclude. Correlation with endoscopy may be helpful for further evaluation. 2. Circumferential bladder wall thickening and prominent mucosal hyperemia suggesting developing infectious or inflammatory cystitis, with suggestion of a possible indirect colovesicular fistula by way of the abscess cavity; urinalysis and urine culture may help confirm. 3. Continued resolution of the intra-abdominal abscesses with Residual intra-abdominal abscess cavities measuring 1.7 x 2.5 cm and 12.5 x 2.4 cm, the latter containing a punctate focus of gas. No residual drainable fluid component identified. 4. Interval development of mild ascites. 5. Small hiatal hernia. Electronically signed by: Dorethia Molt MD MD 10/28/2024 06:31 PM EST RP Workstation: [id number]   DG Chest 2 View Result Date: 10/01/2024 EXAM: 2 VIEW(S) XRAY OF THE CHEST 10/01/2024 07:44:00 PM COMPARISON: None available. CLINICAL HISTORY: SOB FINDINGS: LUNGS AND PLEURA: No focal pulmonary opacity. No pleural effusion. No pneumothorax. HEART AND MEDIASTINUM: Aortic atherosclerosis. No acute abnormality of the cardiac and mediastinal silhouettes. BONES AND SOFT TISSUES: Multilevel thoracic osteophytosis. IMPRESSION: 1. No acute cardiopulmonary process. 2. Aortic atherosclerosis. 3. Multilevel thoracic osteophytosis. Electronically signed by: Greig Pique MD 10/01/2024 07:56 PM EST RP Workstation: [id number]   CT ABDOMEN PELVIS W CONTRAST Result Date: 10/01/2024 EXAM: CT ABDOMEN AND PELVIS WITH CONTRAST 10/01/2024 07:33:38 PM TECHNIQUE: CT of the abdomen and pelvis was performed with the administration of intravenous contrast. 80 mL of iohexol  (OMNIPAQUE ) 300 MG/ML  solution was administered. Multiplanar reformatted images are provided for review. Automated exposure control, iterative reconstruction, and/or weight-based adjustment of the mA/kV was utilized to reduce the radiation dose to as low as reasonably achievable. COMPARISON: None available. CLINICAL HISTORY: RLQ abdominal pain; RLQ and RUQ abd ttp -- concern for malignancy vs appendicitis. FINDINGS: LOWER CHEST: No acute abnormality. LIVER: The liver is unremarkable. GALLBLADDER AND BILE DUCTS: Small gallstone is present. No biliary ductal dilatation. SPLEEN: No acute abnormality. PANCREAS: No acute abnormality. ADRENAL GLANDS: No acute abnormality. 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 There is wall thickening of the dome of the bladder. No stones in the kidneys or ureters. No hydronephrosis. No perinephric or periureteral stranding. GI AND BOWEL: Stomach appears normal. There is marked wall thickening of the sigmoid colon. There is surrounding inflammatory stranding. Enhancing fluid collection with air which appears multiloculated seen adjacent  to the right margin of the sigmoid colon measuring 5.4 x 3.8 x 6.2 cm. A smaller air fluid collection is seen along the left side of the sigmoid colon measuring 1.1 x 3.4 x 2.1 cm. This abuts the dome of the bladder. There is surrounding inflammatory stranding. There are numerous air fluid levels scattered throughout small bowel loops. There is no evidence for bowel obstruction. The appendix is not visualized. PERITONEUM AND RETROPERITONEUM: There is a small amount of free fluid in the pelvis. No gross free intraperitoneal air. VASCULATURE: Aorta is normal in caliber. LYMPH NODES: No lymphadenopathy. REPRODUCTIVE ORGANS: There is a calcified uterine fibroid in the fundal region measuring 2 cm. The ovaries are not well delineated. BONES AND SOFT TISSUES: No acute osseous abnormality. No focal soft tissue abnormality. IMPRESSION: 1.  Findings consistent with sigmoid diverticulitis or colitis with adjacent abscesses; recommend surgical and/or interventional consultation for management. 2. Small gallstone. 3. Bladder dome wall thickening, likely reactive secondary to adjacent abscess . 4. Calcified 2 cm uterine fibroid. Electronically signed by: Greig Pique MD 10/01/2024 07:50 PM EST RP Workstation: [id number]    Medications / Allergies: per chart  Antibiotics: Anti-infectives (From admission, onward)    Start     Dose/Rate Route Frequency Ordered Stop   10/29/24 0000  piperacillin -tazobactam (ZOSYN ) IVPB 3.375 g        3.375 g 12.5 mL/hr over 240 Minutes Intravenous Every 8 hours 10/28/24 2146     10/28/24 1630  piperacillin -tazobactam (ZOSYN ) IVPB 3.375 g        3.375 g 100 mL/hr over 30 Minutes Intravenous  Once 10/28/24 1624 10/28/24 1753   10/28/24 1600  cefTRIAXone  (ROCEPHIN ) 1 g in sodium chloride  0.9 % 100 mL IVPB  Status:  Discontinued        1 g 200 mL/hr over 30 Minutes Intravenous  Once 10/28/24 1550 10/28/24 1611         Note: Portions of this report may have been transcribed using voice recognition software. Every effort was made to ensure accuracy; however, inadvertent computerized transcription errors may be present.   Any transcriptional errors that result from this process are unintentional.      10/29/2024  12:14 PM       [1] No Known Allergies  "

## 2024-10-29 NOTE — Consult Note (Signed)
 "    Michelle Levy  12/08/1955 [id number]  CARE TEAM:  PCP: Aisha Harvey, MD  Outpatient Care Team: Patient Care Team: Aisha Harvey, MD as PCP - General (Family Medicine)  Inpatient Treatment Team: Treatment Team:  Darci Pore, MD Ccs, Md, MD Bobbette Payor, MD Claudene Shela MATSU, NT Purgason, Vina NOVAK, RRT Seabron Ronal HERO, [facility] Herlene Isidor [place], LPN Kriste Asberry BRAVO, RN Georgina Cyndee JONETTA Reynolds, Bethany A, LCSW   This patient is a 69 y.o.female who presents today for surgical evaluation at the request of Darci Pore, MD.  Chief complaint / Reason for evaluation: Diffuse abdominal pain   Patient endorses vomiting which started Wednesday night following a meal. Thursday progressed to vomiting and what she described as loose stools with intermittent crampy abdominal pain. She denies blood in stool. Symptoms persisted throughout the weekend. She did not attempt to treat her symptoms at home. She last vomitted Friday night. Last bowel movement was Saturday which was a loose stool.    She denies any bowel movements, vomiting, flatus, hiccups or burping since her admission.  Currently she endorses intermittent crampy abdominal pain which she rates at a 6/10. She stated pain is diffuse but most notable in the right upper abdomen.   Surgical History - 2 Caesarian sections, piloidal cyst excision   She denies any cardiovascular pulmonary, immunosuppression history.She is able to ambulate well.  She denies smoking or drinking alcohol. She has no know drug allergies.  Assessment  Michelle Levy  69 y.o. female       Problem List:  Principal Problem:   Bowel obstruction (HCC) Active Problems:   Bipolar disorder, curr episode mixed, severe, w/o psychotic features (HCC)   Hyponatremia  Patient is diffusely tender on palpation, most prominently in the RUQ.  Moderate abdominal distention and firmness.     Plan:  Progressing to colon obstruction, no need  for surgery today due to hyponatremia and hypokalemia. Will proceed with surgery once electrolytes improve. Unless emergency surgery is needed.    -monitor electrolytes & replace as needed  Keep K>4, Mg>2, Phos>3  -VTE prophylaxis- SCDs.  Anticoagulation prophyllaxis SQ as appropriate  -mobilize as tolerated to help recovery.  Enlist therapies in moderate/high risk patients as appropriate  @SCGDISCUSSEDWITHUPDATEPATIENTFAMILY @  -Disposition:  2-3 days depending on electrolyte improvement and surgical recovery.  I reviewed last 24 h vitals and pain scores, last 48 h intake and output, last 24 h labs and trends, and last 24 h imaging results. I have reviewed this patient's available data, including medical history, events of note, test results, etc as part of my evaluation.  A significant portion of that time was spent in counseling.  Care during the described time interval was provided by me.  This care required moderate level of medical decision making.  10/29/2024    10/29/2024      Past Medical History:  Diagnosis Date   Alcohol use disorder, severe, dependence (HCC) 11/24/2015   Bereavement 11/24/2015   Bipolar affective disorder, current episode manic (HCC) 09/07/2023   Diverticulitis     Past Surgical History:  Procedure Laterality Date   CESAREAN SECTION      Social History   Socioeconomic History   Marital status: Single    Spouse name: Not on file   Number of children: Not on file   Years of education: Not on file   Highest education level: Not on file  Occupational History   Not on file  Tobacco Use  Smoking status: Never   Smokeless tobacco: Never  Substance and Sexual Activity   Alcohol use: No    Alcohol/week: 0.0 standard drinks of alcohol   Drug use: No   Sexual activity: Never    Birth control/protection: Post-menopausal  Other Topics Concern   Not on file  Social History Narrative   Not on file   Social Drivers of Health   Tobacco Use:  Low Risk (10/28/2024)   Patient History    Smoking Tobacco Use: Never    Smokeless Tobacco Use: Never    Passive Exposure: Not on file  Financial Resource Strain: Not on file  Food Insecurity: No Food Insecurity (10/29/2024)   Epic    Worried About Programme Researcher, Broadcasting/film/video in the Last Year: Never true    Ran Out of Food in the Last Year: Never true  Transportation Needs: No Transportation Needs (10/29/2024)   Epic    Lack of Transportation (Medical): No    Lack of Transportation (Non-Medical): No  Physical Activity: Not on file  Stress: Not on file  Social Connections: Moderately Integrated (10/29/2024)   Social Connection and Isolation Panel    Frequency of Communication with Friends and Family: Twice a week    Frequency of Social Gatherings with Friends and Family: Once a week    Attends Religious Services: More than 4 times per year    Active Member of Clubs or Organizations: Yes    Attends Banker Meetings: More than 4 times per year    Marital Status: Divorced  Intimate Partner Violence: Not At Risk (10/29/2024)   Epic    Fear of Current or Ex-Partner: No    Emotionally Abused: No    Physically Abused: No    Sexually Abused: No  Depression (PHQ2-9): Low Risk (05/09/2024)   Depression (PHQ2-9)    PHQ-2 Score: 1  Alcohol Screen: Low Risk (09/07/2023)   Alcohol Screen    Last Alcohol Screening Score (AUDIT): 5  Housing: Low Risk (10/29/2024)   Epic    Unable to Pay for Housing in the Last Year: No    Number of Times Moved in the Last Year: 0    Homeless in the Last Year: No  Utilities: Not At Risk (10/29/2024)   Epic    Threatened with loss of utilities: No  Health Literacy: Not on file    Family History  Problem Relation Age of Onset   Alcohol abuse Mother    Bipolar disorder Mother    Alcohol abuse Father    Depression Sister    Bipolar disorder Sister    Breast cancer Neg Hx     Current Facility-Administered Medications  Medication Dose Route Frequency  Provider Last Rate Last Admin   0.9 %  sodium chloride  infusion   Intravenous Continuous Franky Redia SAILOR, MD 75 mL/hr at 10/29/24 9378 Infusion Verify at 10/29/24 9378   piperacillin -tazobactam (ZOSYN ) IVPB 3.375 g  3.375 g Intravenous Q8H Franky Redia SAILOR, MD 12.5 mL/hr at 10/29/24 0801 3.375 g at 10/29/24 0801   potassium chloride  10 mEq in 100 mL IVPB  10 mEq Intravenous Q1 Hr x 4 Sreeram, Narendranath, MD 100 mL/hr at 10/29/24 0939 10 mEq at 10/29/24 0939     Allergies[1]  ROS:   All other systems reviewed & are negative except per HPI or as noted below: @scgroswnl @  BP 118/60 (BP Location: Left Arm)   Pulse 66   Temp 98.1 F (36.7 C) (Oral)   Resp 18  Ht 5' 3 (1.6 m)   Wt 56.2 kg   SpO2 98%   BMI 21.95 kg/m   Physical Exam:   GI - Mild abdominal distention and tenderness in RUQ. Bowel sounds present. Neuro - Alert and oriented X4 Pulmonary - Trachea in midline, no use of accessory muscles. Cardiac - No cyanosis, no peripheral edema   Results:   Labs: Results for orders placed or performed during the hospital encounter of 10/28/24 (from the past 48 hours)  Comprehensive metabolic panel     Status: Abnormal   Collection Time: 10/28/24 12:26 PM  Result Value Ref Range   Sodium 123 (L) 135 - 145 mmol/L   Potassium 3.5 3.5 - 5.1 mmol/L   Chloride 84 (L) 98 - 111 mmol/L   CO2 24 22 - 32 mmol/L   Glucose, Bld 114 (H) 70 - 99 mg/dL    Comment: Glucose reference range applies only to samples taken after fasting for at least 8 hours.   BUN 10 8 - 23 mg/dL   Creatinine, Ser 9.14 0.44 - 1.00 mg/dL   Calcium  9.3 8.9 - 10.3 mg/dL   Total Protein 6.9 6.5 - 8.1 g/dL   Albumin  4.0 3.5 - 5.0 g/dL   AST 24 15 - 41 U/L   ALT 16 0 - 44 U/L   Alkaline Phosphatase 78 38 - 126 U/L   Total Bilirubin 0.5 0.0 - 1.2 mg/dL   GFR, Estimated >39 >39 mL/min    Comment: (NOTE) Calculated using the CKD-EPI Creatinine Equation (2021)    Anion gap 14 5 - 15    Comment: Performed  at [facility], [address], [place], [place] [address]  Lipase, blood     Status: None   Collection Time: 10/28/24 12:26 PM  Result Value Ref Range   Lipase 17 11 - 51 U/L    Comment: Performed at [facility], [address], [place], [place] [address]  CBC with Diff     Status: Abnormal   Collection Time: 10/28/24 12:26 PM  Result Value Ref Range   WBC 12.6 (H) 4.0 - 10.5 K/uL   RBC 4.44 3.87 - 5.11 MIL/uL   Hemoglobin 13.7 12.0 - 15.0 g/dL   HCT 61.8 63.9 - 53.9 %   MCV 85.8 80.0 - 100.0 fL   MCH 30.9 26.0 - 34.0 pg   MCHC 36.0 30.0 - 36.0 g/dL   RDW 86.6 88.4 - 84.4 %   Platelets 460 (H) 150 - 400 K/uL   nRBC 0.0 0.0 - 0.2 %   Neutrophils Relative % 78 %   Neutro Abs 9.8 (H) 1.7 - 7.7 K/uL   Lymphocytes Relative 13 %   Lymphs Abs 1.7 0.7 - 4.0 K/uL   Monocytes Relative 8 %   Monocytes Absolute 1.0 0.1 - 1.0 K/uL   Eosinophils Relative 0 %   Eosinophils Absolute 0.0 0.0 - 0.5 K/uL   Basophils Relative 0 %   Basophils Absolute 0.0 0.0 - 0.1 K/uL   Immature Granulocytes 1 %   Abs Immature Granulocytes 0.07 0.00 - 0.07 K/uL    Comment: Performed at [facility], [address], [place], [place] [address]  Urinalysis, Routine w reflex microscopic -Urine, Clean Catch     Status: Abnormal   Collection Time: 10/28/24  2:55 PM  Result Value Ref Range   Color, Urine YELLOW YELLOW   APPearance CLOUDY (A) CLEAR   Specific Gravity, Urine 1.015 1.005 - 1.030  pH 6.0 5.0 - 8.0   Glucose, UA NEGATIVE NEGATIVE mg/dL   Hgb urine dipstick MODERATE (A) NEGATIVE   Bilirubin Urine NEGATIVE NEGATIVE   Ketones, ur 5 (A) NEGATIVE mg/dL   Protein, ur 30 (A) NEGATIVE mg/dL   Nitrite NEGATIVE NEGATIVE   Leukocytes,Ua LARGE (A) NEGATIVE   RBC / HPF 11-20 0 - 5 RBC/hpf   WBC, UA >50 0 - 5 WBC/hpf   Bacteria, UA MANY (A) NONE SEEN   Squamous Epithelial / HPF 0-5 0 - 5 /HPF   WBC Clumps PRESENT    Mucus PRESENT    Non Squamous  Epithelial 0-5 (A) NONE SEEN    Comment: Performed at [facility], [address], [place], [place] [address]  Blood culture (routine x 2)     Status: None (Preliminary result)   Collection Time: 10/28/24  4:01 PM   Specimen: BLOOD  Result Value Ref Range   Specimen Description      BLOOD BLOOD RIGHT HAND Performed at [facility], [address], [place], [place] [address]    Special Requests      BOTTLES DRAWN AEROBIC AND ANAEROBIC Blood Culture results may not be optimal due to an inadequate volume of blood received in culture bottles Performed at [facility], [address] [place], [place] [address]    Culture      NO GROWTH < 24 HOURS Performed at [facility], [address] [place], [place] [address]    Report Status PENDING   Basic metabolic panel     Status: Abnormal   Collection Time: 10/28/24 10:15 PM  Result Value Ref Range   Sodium 120 (L) 135 - 145 mmol/L   Potassium 3.7 3.5 - 5.1 mmol/L    Comment: HEMOLYSIS AT THIS LEVEL MAY AFFECT RESULT   Chloride 87 (L) 98 - 111 mmol/L   CO2 20 (L) 22 - 32 mmol/L   Glucose, Bld 98 70 - 99 mg/dL    Comment: Glucose reference range applies only to samples taken after fasting for at least 8 hours.   BUN 8 8 - 23 mg/dL   Creatinine, Ser 9.35 0.44 - 1.00 mg/dL   Calcium  8.2 (L) 8.9 - 10.3 mg/dL   GFR, Estimated >39 >39 mL/min    Comment: (NOTE) Calculated using the CKD-EPI Creatinine Equation (2021)    Anion gap 14 5 - 15    Comment: Performed at [facility], [address], [place], [place] [address]  Basic metabolic panel     Status: Abnormal   Collection Time: 10/29/24  5:31 AM  Result Value Ref Range   Sodium 125 (L) 135 - 145 mmol/L   Potassium 2.7 (LL) 3.5 - 5.1 mmol/L    Comment: Critical Value, Read Back and verified with NUNEZ, I RN AT [phone] ON 10/29/2024 BY XIONG, K   Chloride 89 (L) 98 - 111 mmol/L   CO2 21 (L) 22 - 32 mmol/L    Glucose, Bld 86 70 - 99 mg/dL    Comment: Glucose reference range applies only to samples taken after fasting for at least 8 hours.   BUN 7 (L) 8 - 23 mg/dL   Creatinine, Ser 9.32 0.44 - 1.00 mg/dL   Calcium  8.3 (L) 8.9 - 10.3 mg/dL   GFR, Estimated >39 >39 mL/min    Comment: (NOTE) Calculated using the CKD-EPI Creatinine Equation (2021)    Anion gap 15 5 - 15  Comment: Performed at [facility], [address] [place], [place] [address]  Hepatic function panel     Status: Abnormal   Collection Time: 10/29/24  5:31 AM  Result Value Ref Range   Total Protein 5.5 (L) 6.5 - 8.1 g/dL   Albumin  3.1 (L) 3.5 - 5.0 g/dL   AST 17 15 - 41 U/L   ALT 10 0 - 44 U/L   Alkaline Phosphatase 63 38 - 126 U/L   Total Bilirubin 0.4 0.0 - 1.2 mg/dL   Bilirubin, Direct 0.2 0.0 - 0.2 mg/dL   Indirect Bilirubin 0.2 (L) 0.3 - 0.9 mg/dL    Comment: Performed at [facility], [address] [place], [place] [address]  CBC with Differential/Platelet     Status: Abnormal   Collection Time: 10/29/24  5:31 AM  Result Value Ref Range   WBC 10.9 (H) 4.0 - 10.5 K/uL   RBC 3.90 3.87 - 5.11 MIL/uL   Hemoglobin 12.0 12.0 - 15.0 g/dL   HCT 66.4 (L) 63.9 - 53.9 %   MCV 85.9 80.0 - 100.0 fL   MCH 30.8 26.0 - 34.0 pg   MCHC 35.8 30.0 - 36.0 g/dL   RDW 86.5 88.4 - 84.4 %   Platelets 394 150 - 400 K/uL   nRBC 0.0 0.0 - 0.2 %   Neutrophils Relative % 72 %   Neutro Abs 7.9 (H) 1.7 - 7.7 K/uL   Lymphocytes Relative 13 %   Lymphs Abs 1.4 0.7 - 4.0 K/uL   Monocytes Relative 13 %   Monocytes Absolute 1.4 (H) 0.1 - 1.0 K/uL   Eosinophils Relative 0 %   Eosinophils Absolute 0.0 0.0 - 0.5 K/uL   Basophils Relative 1 %   Basophils Absolute 0.1 0.0 - 0.1 K/uL   Immature Granulocytes 1 %   Abs Immature Granulocytes 0.05 0.00 - 0.07 K/uL    Comment: Performed at [facility], [address] [place], [place] [address]  TSH     Status: None   Collection Time:  10/29/24  5:31 AM  Result Value Ref Range   TSH 2.960 0.350 - 4.500 uIU/mL    Comment: Performed at [facility], [address] [place], [place] [address]  Cortisol     Status: None   Collection Time: 10/29/24  5:31 AM  Result Value Ref Range   Cortisol, Plasma 17.9 ug/dL    Comment: (NOTE) AM    6.7 - 22.6 ug/dL PM   <89.9       ug/dL Performed at [facility], [address], [place], [place] [address]   Urinalysis, w/ Reflex to Culture (Infection Suspected) -Urine, Clean Catch     Status: Abnormal   Collection Time: 10/29/24  9:41 AM  Result Value Ref Range   Specimen Source URINE, CLEAN CATCH    Color, Urine YELLOW YELLOW   APPearance HAZY (A) CLEAR   Specific Gravity, Urine 1.030 1.005 - 1.030   pH 6.0 5.0 - 8.0   Glucose, UA NEGATIVE NEGATIVE mg/dL   Hgb urine dipstick SMALL (A) NEGATIVE   Bilirubin Urine NEGATIVE NEGATIVE   Ketones, ur 20 (A) NEGATIVE mg/dL   Protein, ur 30 (A) NEGATIVE mg/dL   Nitrite NEGATIVE NEGATIVE   Leukocytes,Ua LARGE (A) NEGATIVE   RBC / HPF 0-5 0 - 5 RBC/hpf   WBC, UA >50 0 - 5 WBC/hpf    Comment:        Reflex urine  culture not performed if WBC <=10, OR if Squamous epithelial cells >5. If Squamous epithelial cells >5 suggest recollection.    Bacteria, UA NONE SEEN NONE SEEN   Squamous Epithelial / HPF 0-5 0 - 5 /HPF   Mucus PRESENT     Comment: Performed at [facility], [address] [place], [place] [address]  Sodium, urine, random     Status: None   Collection Time: 10/29/24  9:48 AM  Result Value Ref Range   Sodium, Ur <30 mmol/L    Comment: NO NORMAL RANGE ESTABLISHED FOR THIS TEST Performed at [facility], [address] [place], [place] [address]     Imaging / Studies: DG Abd Portable 1 View Result Date: 10/28/2024 EXAM: 1 VIEW XRAY OF THE ABDOMEN 10/28/2024 07:17:00 PM COMPARISON: CT abdomen and pelvis 11/26 CLINICAL HISTORY: post NG tube insertion  FINDINGS: LINES, TUBES AND DEVICES: Enteric tube in place with tip and side port below the diaphragm overlying the expected region of the stomach. BOWEL: Mild gaseous distention of small bowel and colon in the included abdomen. Hiatal hernia. SOFT TISSUES: No abnormal calcifications. BONES: No acute fracture. IMPRESSION: 1. Enteric tube in place with tip and side port below the diaphragm overlying the expected region of the stomach. 2. Mild gaseous distention of small bowel and colon in the included abdomen. Electronically signed by: Morgane Naveau MD MD 10/28/2024 07:20 PM EST RP Workstation: [id number]   CT ABDOMEN PELVIS W CONTRAST Result Date: 10/28/2024 EXAM: CT ABDOMEN AND PELVIS WITH CONTRAST 10/17/2024 03:16:21 PM TECHNIQUE: CT of the abdomen and pelvis was performed with the administration of 100 mL of iohexol  (OMNIPAQUE ) 300 MG/ML solution. Multiplanar reformatted images are provided for review. Automated exposure control, iterative reconstruction, and/or weight-based adjustment of the mA/kV was utilized to reduce the radiation dose to as low as reasonably achievable. COMPARISON: 10/01/2024 CLINICAL HISTORY: Diverticulitis, complication suspected; follow up diverticulitis with abscess not amenable to IR drainage. Perforated diverticulitis with multiple intra-abdominal abscesses. FINDINGS: LOWER CHEST: No acute abnormality. LIVER: The liver is unremarkable. GALLBLADDER AND BILE DUCTS: Gallbladder is unremarkable. No biliary ductal dilatation. SPLEEN: No acute abnormality. PANCREAS: No acute abnormality. ADRENAL GLANDS: No acute abnormality. KIDNEYS, URETERS AND BLADDER: No stones in the kidneys or ureters. No hydronephrosis. No perinephric or periureteral stranding. Small amount of nondependent gas is seen within the bladder lumen. The larger pericolonic abscess (1.8 x 3.0 cm, series 2, image 66) abuts the bladder dome. Development of an indirect colovesicular fistula by way of the abscess cavity is not  excluded. Correlation with urinalysis and urine culture may be helpful for confirmation. GI AND BOWEL: There is circumferential mural thickening and submucosal edema in the mid sigmoid colon, similar to prior examination. There is infiltrative soft tissue within the adjacent sigmoid mesentery (series 2, image 60) which may reflect inflammatory tissue though infiltrative disease as can be seen with a perforated malignancy, could appear similarly. Previously noted pericolonic abscesses have significantly decreased in size, measuring 1.8 x 3.0 cm (series 2, image 66) and 2.0 x 1.1 cm (series 2, image 63). Small amount of gas and fluid is seen within the larger collection. Proximal to the area of inflammatory change involving the mid sigmoid colon, the colon is unremarkable. Stomach and small bowel are unremarkable. There is no bowel obstruction. PERITONEUM AND RETROPERITONEUM: No ascites. No free air. VASCULATURE: Aorta is normal in caliber. LYMPH NODES: No lymphadenopathy. REPRODUCTIVE ORGANS: Calcified involuted fibroid within the uterus. Pelvic organs are otherwise unremarkable.  BONES AND SOFT TISSUES: Osseous structures are age appropriate. No acute osseous abnormality. No lytic or blastic bone lesion. No focal soft tissue abnormality. IMPRESSION: 1. Findings compatible with perforated sigmoid diverticulitis, with improving pericolonic abscesses, and persistent adjacent sigmoid mesenteric soft tissue which may be inflammatory; underlying perforated malignancy is not excluded. Correlation with endoscopy may be helpful once the patient's acute issues have resolved. 2. Gas and fluid within the larger pericolonic collection abutting the bladder dome with new intravesical gas, and an indirect colovesical fistula is not excluded; consider urinalysis and urine culture. Electronically signed by: Dorethia Molt MD MD 10/28/2024 06:38 PM EST RP Workstation: [id number]   CT ABDOMEN PELVIS W CONTRAST Result Date:  10/28/2024 EXAM: CT ABDOMEN AND PELVIS WITH CONTRAST 10/28/2024 06:17:07 PM TECHNIQUE: CT of the abdomen and pelvis was performed with the administration of intravenous contrast. Multiplanar reformatted images are provided for review. Automated exposure control, iterative reconstruction, and/or weight-based adjustment of the mA/kV was utilized to reduce the radiation dose to as low as reasonably achievable. COMPARISON: 10/17/2024 and 10/01/2024 CLINICAL HISTORY: Unspecified abdominal pain, nausea, diarrhea, complicated diverticulitis with multiple intraabdominal abscesses. FINDINGS: LOWER CHEST: Right basilar discoid atelectasis. Small hiatal hernia. LIVER: The liver is unremarkable. GALLBLADDER AND BILE DUCTS: Gallbladder is unremarkable. No biliary ductal dilatation. SPLEEN: No acute abnormality. PANCREAS: No acute abnormality. ADRENAL GLANDS: No acute abnormality. KIDNEYS, URETERS AND BLADDER: No stones in the kidneys or ureters. No hydronephrosis. No perinephric or periureteral stranding. There has developed circumferential bladder wall thickening and prominent mucosal hyperemia suggesting a developing infectious or inflammatory cystitis. The bladder, however, is decompressed. There is suggestion of a possible indirect colovesicular fistula by way of the abscess cavity best appreciated on coronal image 67 - 70, series 7. Correlation with urine culture may be helpful for confirmation. GI AND BOWEL: Stomach and small bowel were unremarkable. Appendix normal. The previously identified intra-abdominal abscesses continued to improve and now have nearly resolved with the residual abscess cavities measuring 1.7 x 2.5 cm, containing no significant drainable fluid (image 75, series 2), and 12.5 x 2.4 cm containing a punctate focus of gas (image 73, series 2). However, a large bowel obstruction has developed with gas and fluid distention of the colon proximal to the mid sigmoid colon where there is irregular transition to  a decompressed sigmoid colon (image 72, series 2). While this may simply relate to edema or post inflammatory scarring, an underlying cicatricial mass is difficult to exclude on this examination. The cecum is dilated up to 9 cm in diameter. PERITONEUM AND RETROPERITONEUM: Interval development of mild ascites. No free intraperitoneal gas or free air. VASCULATURE: Aorta is normal in caliber. LYMPH NODES: No lymphadenopathy. REPRODUCTIVE ORGANS: Calcified inverted fibroid within the uterus. Pelvic organs are otherwise unremarkable. BONES AND SOFT TISSUES: Osseous structures are age appropriate. No acute bone abnormality. No lytic or blastic bone lesion. No focal soft tissue abnormality. IMPRESSION: 1. Large bowel obstruction with gas and fluid distention of the colon proximal to the mid sigmoid colon, where there is irregular transition to a decompressed sigmoid colon, with cecal dilation up to 9 cm in diameter; an underlying cicatricial mass is difficult to exclude. Correlation with endoscopy may be helpful for further evaluation. 2. Circumferential bladder wall thickening and prominent mucosal hyperemia suggesting developing infectious or inflammatory cystitis, with suggestion of a possible indirect colovesicular fistula by way of the abscess cavity; urinalysis and urine culture may help confirm. 3. Continued resolution of the intra-abdominal abscesses with Residual intra-abdominal abscess cavities measuring  1.7 x 2.5 cm and 12.5 x 2.4 cm, the latter containing a punctate focus of gas. No residual drainable fluid component identified. 4. Interval development of mild ascites. 5. Small hiatal hernia. Electronically signed by: Dorethia Molt MD MD 10/28/2024 06:31 PM EST RP Workstation: [id number]   DG Chest 2 View Result Date: 10/01/2024 EXAM: 2 VIEW(S) XRAY OF THE CHEST 10/01/2024 07:44:00 PM COMPARISON: None available. CLINICAL HISTORY: SOB FINDINGS: LUNGS AND PLEURA: No focal pulmonary opacity. No pleural  effusion. No pneumothorax. HEART AND MEDIASTINUM: Aortic atherosclerosis. No acute abnormality of the cardiac and mediastinal silhouettes. BONES AND SOFT TISSUES: Multilevel thoracic osteophytosis. IMPRESSION: 1. No acute cardiopulmonary process. 2. Aortic atherosclerosis. 3. Multilevel thoracic osteophytosis. Electronically signed by: Greig Pique MD 10/01/2024 07:56 PM EST RP Workstation: [id number]   CT ABDOMEN PELVIS W CONTRAST Result Date: 10/01/2024 EXAM: CT ABDOMEN AND PELVIS WITH CONTRAST 10/01/2024 07:33:38 PM TECHNIQUE: CT of the abdomen and pelvis was performed with the administration of intravenous contrast. 80 mL of iohexol  (OMNIPAQUE ) 300 MG/ML solution was administered. Multiplanar reformatted images are provided for review. Automated exposure control, iterative reconstruction, and/or weight-based adjustment of the mA/kV was utilized to reduce the radiation dose to as low as reasonably achievable. COMPARISON: None available. CLINICAL HISTORY: RLQ abdominal pain; RLQ and RUQ abd ttp -- concern for malignancy vs appendicitis. FINDINGS: LOWER CHEST: No acute abnormality. LIVER: The liver is unremarkable. GALLBLADDER AND BILE DUCTS: Small gallstone is present. No biliary ductal dilatation. SPLEEN: No acute abnormality. PANCREAS: No acute abnormality. ADRENAL GLANDS: No acute abnormality. 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 There is wall thickening of the dome of the bladder. No stones in the kidneys or ureters. No hydronephrosis. No perinephric or periureteral stranding. GI AND BOWEL: Stomach appears normal. There is marked wall thickening of the sigmoid colon. There is surrounding inflammatory stranding. Enhancing fluid collection with air which appears multiloculated seen adjacent to the right margin of the sigmoid colon measuring 5.4 x 3.8 x 6.2 cm. A smaller air fluid collection is seen along the left side of the sigmoid colon measuring 1.1 x 3.4 x 2.1  cm. This abuts the dome of the bladder. There is surrounding inflammatory stranding. There are numerous air fluid levels scattered throughout small bowel loops. There is no evidence for bowel obstruction. The appendix is not visualized. PERITONEUM AND RETROPERITONEUM: There is a small amount of free fluid in the pelvis. No gross free intraperitoneal air. VASCULATURE: Aorta is normal in caliber. LYMPH NODES: No lymphadenopathy. REPRODUCTIVE ORGANS: There is a calcified uterine fibroid in the fundal region measuring 2 cm. The ovaries are not well delineated. BONES AND SOFT TISSUES: No acute osseous abnormality. No focal soft tissue abnormality. IMPRESSION: 1. Findings consistent with sigmoid diverticulitis or colitis with adjacent abscesses; recommend surgical and/or interventional consultation for management. 2. Small gallstone. 3. Bladder dome wall thickening, likely reactive secondary to adjacent abscess . 4. Calcified 2 cm uterine fibroid. Electronically signed by: Greig Pique MD 10/01/2024 07:50 PM EST RP Workstation: [id number]    Medications / Allergies: per chart  Antibiotics: Anti-infectives (From admission, onward)    Start     Dose/Rate Route Frequency Ordered Stop   10/29/24 0000  piperacillin -tazobactam (ZOSYN ) IVPB 3.375 g        3.375 g 12.5 mL/hr over 240 Minutes Intravenous Every 8 hours 10/28/24 2146     10/28/24 1630  piperacillin -tazobactam (ZOSYN ) IVPB 3.375 g        3.375 g 100 mL/hr over  30 Minutes Intravenous  Once 10/28/24 1624 10/28/24 1753   10/28/24 1600  cefTRIAXone  (ROCEPHIN ) 1 g in sodium chloride  0.9 % 100 mL IVPB  Status:  Discontinued        1 g 200 mL/hr over 30 Minutes Intravenous  Once 10/28/24 1550 10/28/24 1611         Note: Portions of this report may have been transcribed using voice recognition software. Every effort was made to ensure accuracy; however, inadvertent computerized transcription errors may be present.   Any transcriptional errors that  result from this process are unintentional.      10/29/2024  10:10 AM      [1] No Known Allergies  "

## 2024-10-29 NOTE — H&P (View-Only) (Signed)
 "    Michelle Levy  June 24, 1956 [id number]  CARE TEAM:  PCP: Aisha Harvey, MD  Outpatient Care Team: Patient Care Team: Aisha Harvey, MD as PCP - General (Family Medicine)  Inpatient Treatment Team: Treatment Team:  Darci Pore, MD Ccs, Md, MD Bobbette Payor, MD Claudene Shela MATSU, NT Purgason, Vina NOVAK, RRT Seabron Ronal HERO, [facility] Herlene Isidor [place], LPN Kriste Asberry BRAVO, RN Georgina Cyndee JONETTA Reynolds, Bethany A, LCSW   This patient is a 69 y.o.female who presents today for surgical evaluation at the request of Darci Pore, MD.  Chief complaint / Reason for evaluation: Diffuse abdominal pain   Patient endorses vomiting which started Wednesday night following a meal. Thursday progressed to vomiting and what she described as loose stools with intermittent crampy abdominal pain. She denies blood in stool. Symptoms persisted throughout the weekend. She did not attempt to treat her symptoms at home. She last vomitted Friday night. Last bowel movement was Saturday which was a loose stool.    She denies any bowel movements, vomiting, flatus, hiccups or burping since her admission.  Currently she endorses intermittent crampy abdominal pain which she rates at a 6/10. She stated pain is diffuse but most notable in the right upper abdomen.   Surgical History - 2 Caesarian sections, piloidal cyst excision   She denies any cardiovascular pulmonary, immunosuppression history.She is able to ambulate well.  She denies smoking or drinking alcohol. She has no know drug allergies.  Assessment  Michelle Levy  69 y.o. female       Problem List:  Principal Problem:   Bowel obstruction (HCC) Active Problems:   Bipolar disorder, curr episode mixed, severe, w/o psychotic features (HCC)   Hyponatremia  Patient is diffusely tender on palpation, most prominently in the RUQ.  Moderate abdominal distention and firmness.     Plan:  Progressing to colon obstruction, no need  for surgery today due to hyponatremia and hypokalemia. Will proceed with surgery once electrolytes improve. Unless emergency surgery is needed.    -monitor electrolytes & replace as needed  Keep K>4, Mg>2, Phos>3  -VTE prophylaxis- SCDs.  Anticoagulation prophyllaxis SQ as appropriate  -mobilize as tolerated to help recovery.  Enlist therapies in moderate/high risk patients as appropriate  @SCGDISCUSSEDWITHUPDATEPATIENTFAMILY @  -Disposition:  2-3 days depending on electrolyte improvement and surgical recovery.  I reviewed last 24 h vitals and pain scores, last 48 h intake and output, last 24 h labs and trends, and last 24 h imaging results. I have reviewed this patient's available data, including medical history, events of note, test results, etc as part of my evaluation.  A significant portion of that time was spent in counseling.  Care during the described time interval was provided by me.  This care required moderate level of medical decision making.  10/29/2024    10/29/2024      Past Medical History:  Diagnosis Date   Alcohol use disorder, severe, dependence (HCC) 11/24/2015   Bereavement 11/24/2015   Bipolar affective disorder, current episode manic (HCC) 09/07/2023   Diverticulitis     Past Surgical History:  Procedure Laterality Date   CESAREAN SECTION      Social History   Socioeconomic History   Marital status: Single    Spouse name: Not on file   Number of children: Not on file   Years of education: Not on file   Highest education level: Not on file  Occupational History   Not on file  Tobacco Use  Smoking status: Never   Smokeless tobacco: Never  Substance and Sexual Activity   Alcohol use: No    Alcohol/week: 0.0 standard drinks of alcohol   Drug use: No   Sexual activity: Never    Birth control/protection: Post-menopausal  Other Topics Concern   Not on file  Social History Narrative   Not on file   Social Drivers of Health   Tobacco Use:  Low Risk (10/28/2024)   Patient History    Smoking Tobacco Use: Never    Smokeless Tobacco Use: Never    Passive Exposure: Not on file  Financial Resource Strain: Not on file  Food Insecurity: No Food Insecurity (10/29/2024)   Epic    Worried About Programme Researcher, Broadcasting/film/video in the Last Year: Never true    Ran Out of Food in the Last Year: Never true  Transportation Needs: No Transportation Needs (10/29/2024)   Epic    Lack of Transportation (Medical): No    Lack of Transportation (Non-Medical): No  Physical Activity: Not on file  Stress: Not on file  Social Connections: Moderately Integrated (10/29/2024)   Social Connection and Isolation Panel    Frequency of Communication with Friends and Family: Twice a week    Frequency of Social Gatherings with Friends and Family: Once a week    Attends Religious Services: More than 4 times per year    Active Member of Clubs or Organizations: Yes    Attends Banker Meetings: More than 4 times per year    Marital Status: Divorced  Intimate Partner Violence: Not At Risk (10/29/2024)   Epic    Fear of Current or Ex-Partner: No    Emotionally Abused: No    Physically Abused: No    Sexually Abused: No  Depression (PHQ2-9): Low Risk (05/09/2024)   Depression (PHQ2-9)    PHQ-2 Score: 1  Alcohol Screen: Low Risk (09/07/2023)   Alcohol Screen    Last Alcohol Screening Score (AUDIT): 5  Housing: Low Risk (10/29/2024)   Epic    Unable to Pay for Housing in the Last Year: No    Number of Times Moved in the Last Year: 0    Homeless in the Last Year: No  Utilities: Not At Risk (10/29/2024)   Epic    Threatened with loss of utilities: No  Health Literacy: Not on file    Family History  Problem Relation Age of Onset   Alcohol abuse Mother    Bipolar disorder Mother    Alcohol abuse Father    Depression Sister    Bipolar disorder Sister    Breast cancer Neg Hx     Current Facility-Administered Medications  Medication Dose Route Frequency  Provider Last Rate Last Admin   0.9 %  sodium chloride  infusion   Intravenous Continuous Franky Redia SAILOR, MD 75 mL/hr at 10/29/24 9378 Infusion Verify at 10/29/24 9378   piperacillin -tazobactam (ZOSYN ) IVPB 3.375 g  3.375 g Intravenous Q8H Franky Redia SAILOR, MD 12.5 mL/hr at 10/29/24 0801 3.375 g at 10/29/24 0801   potassium chloride  10 mEq in 100 mL IVPB  10 mEq Intravenous Q1 Hr x 4 Sreeram, Narendranath, MD 100 mL/hr at 10/29/24 1140 10 mEq at 10/29/24 1140     Allergies[1]  ROS:   All other systems reviewed & are negative except per HPI or as noted below: @scgroswnl @  BP 123/61 (BP Location: Left Arm)   Pulse 70   Temp 98.2 F (36.8 C) (Oral)   Resp 18  Ht 5' 3 (1.6 m)   Wt 56.2 kg   SpO2 99%   BMI 21.95 kg/m   Physical Exam:   GI - Mild abdominal distention and tenderness in RUQ. Bowel sounds present. Neuro - Alert and oriented X4 Pulmonary - Trachea in midline, no use of accessory muscles. Cardiac - No cyanosis, no peripheral edema   Results:   Labs: Results for orders placed or performed during the hospital encounter of 10/28/24 (from the past 48 hours)  Comprehensive metabolic panel     Status: Abnormal   Collection Time: 10/28/24 12:26 PM  Result Value Ref Range   Sodium 123 (L) 135 - 145 mmol/L   Potassium 3.5 3.5 - 5.1 mmol/L   Chloride 84 (L) 98 - 111 mmol/L   CO2 24 22 - 32 mmol/L   Glucose, Bld 114 (H) 70 - 99 mg/dL    Comment: Glucose reference range applies only to samples taken after fasting for at least 8 hours.   BUN 10 8 - 23 mg/dL   Creatinine, Ser 9.14 0.44 - 1.00 mg/dL   Calcium  9.3 8.9 - 10.3 mg/dL   Total Protein 6.9 6.5 - 8.1 g/dL   Albumin  4.0 3.5 - 5.0 g/dL   AST 24 15 - 41 U/L   ALT 16 0 - 44 U/L   Alkaline Phosphatase 78 38 - 126 U/L   Total Bilirubin 0.5 0.0 - 1.2 mg/dL   GFR, Estimated >39 >39 mL/min    Comment: (NOTE) Calculated using the CKD-EPI Creatinine Equation (2021)    Anion gap 14 5 - 15    Comment: Performed  at [facility], [address], [place], [place] [address]  Lipase, blood     Status: None   Collection Time: 10/28/24 12:26 PM  Result Value Ref Range   Lipase 17 11 - 51 U/L    Comment: Performed at [facility], [address] [place], [place] [address]  CBC with Diff     Status: Abnormal   Collection Time: 10/28/24 12:26 PM  Result Value Ref Range   WBC 12.6 (H) 4.0 - 10.5 K/uL   RBC 4.44 3.87 - 5.11 MIL/uL   Hemoglobin 13.7 12.0 - 15.0 g/dL   HCT 61.8 63.9 - 53.9 %   MCV 85.8 80.0 - 100.0 fL   MCH 30.9 26.0 - 34.0 pg   MCHC 36.0 30.0 - 36.0 g/dL   RDW 86.6 88.4 - 84.4 %   Platelets 460 (H) 150 - 400 K/uL   nRBC 0.0 0.0 - 0.2 %   Neutrophils Relative % 78 %   Neutro Abs 9.8 (H) 1.7 - 7.7 K/uL   Lymphocytes Relative 13 %   Lymphs Abs 1.7 0.7 - 4.0 K/uL   Monocytes Relative 8 %   Monocytes Absolute 1.0 0.1 - 1.0 K/uL   Eosinophils Relative 0 %   Eosinophils Absolute 0.0 0.0 - 0.5 K/uL   Basophils Relative 0 %   Basophils Absolute 0.0 0.0 - 0.1 K/uL   Immature Granulocytes 1 %   Abs Immature Granulocytes 0.07 0.00 - 0.07 K/uL    Comment: Performed at [facility], [address], [place], [place] [address]  Urinalysis, Routine w reflex microscopic -Urine, Clean Catch     Status: Abnormal   Collection Time: 10/28/24  2:55 PM  Result Value Ref Range   Color, Urine YELLOW YELLOW   APPearance CLOUDY (A) CLEAR   Specific Gravity, Urine 1.015 1.005 - 1.030  pH 6.0 5.0 - 8.0   Glucose, UA NEGATIVE NEGATIVE mg/dL   Hgb urine dipstick MODERATE (A) NEGATIVE   Bilirubin Urine NEGATIVE NEGATIVE   Ketones, ur 5 (A) NEGATIVE mg/dL   Protein, ur 30 (A) NEGATIVE mg/dL   Nitrite NEGATIVE NEGATIVE   Leukocytes,Ua LARGE (A) NEGATIVE   RBC / HPF 11-20 0 - 5 RBC/hpf   WBC, UA >50 0 - 5 WBC/hpf   Bacteria, UA MANY (A) NONE SEEN   Squamous Epithelial / HPF 0-5 0 - 5 /HPF   WBC Clumps PRESENT    Mucus PRESENT    Non Squamous  Epithelial 0-5 (A) NONE SEEN    Comment: Performed at [facility], [address] [place], [place] [address]  Blood culture (routine x 2)     Status: None (Preliminary result)   Collection Time: 10/28/24  4:01 PM   Specimen: BLOOD  Result Value Ref Range   Specimen Description      BLOOD BLOOD RIGHT HAND Performed at [facility], [address] [place], [place] [address]    Special Requests      BOTTLES DRAWN AEROBIC AND ANAEROBIC Blood Culture results may not be optimal due to an inadequate volume of blood received in culture bottles Performed at [facility], [address], [place], [place] [address]    Culture      NO GROWTH < 24 HOURS Performed at [facility], [address], [place], [place] [address]    Report Status PENDING   Basic metabolic panel     Status: Abnormal   Collection Time: 10/28/24 10:15 PM  Result Value Ref Range   Sodium 120 (L) 135 - 145 mmol/L   Potassium 3.7 3.5 - 5.1 mmol/L    Comment: HEMOLYSIS AT THIS LEVEL MAY AFFECT RESULT   Chloride 87 (L) 98 - 111 mmol/L   CO2 20 (L) 22 - 32 mmol/L   Glucose, Bld 98 70 - 99 mg/dL    Comment: Glucose reference range applies only to samples taken after fasting for at least 8 hours.   BUN 8 8 - 23 mg/dL   Creatinine, Ser 9.35 0.44 - 1.00 mg/dL   Calcium  8.2 (L) 8.9 - 10.3 mg/dL   GFR, Estimated >39 >39 mL/min    Comment: (NOTE) Calculated using the CKD-EPI Creatinine Equation (2021)    Anion gap 14 5 - 15    Comment: Performed at [facility], [address], [place], [place] [address]  Basic metabolic panel     Status: Abnormal   Collection Time: 10/29/24  5:31 AM  Result Value Ref Range   Sodium 125 (L) 135 - 145 mmol/L   Potassium 2.7 (LL) 3.5 - 5.1 mmol/L    Comment: Critical Value, Read Back and verified with NUNEZ, I RN AT [phone] ON 10/29/2024 BY XIONG, K   Chloride 89 (L) 98 - 111 mmol/L   CO2 21 (L) 22 - 32 mmol/L    Glucose, Bld 86 70 - 99 mg/dL    Comment: Glucose reference range applies only to samples taken after fasting for at least 8 hours.   BUN 7 (L) 8 - 23 mg/dL   Creatinine, Ser 9.32 0.44 - 1.00 mg/dL   Calcium  8.3 (L) 8.9 - 10.3 mg/dL   GFR, Estimated >39 >39 mL/min    Comment: (NOTE) Calculated using the CKD-EPI Creatinine Equation (2021)    Anion gap 15 5 - 15  Comment: Performed at [facility], [address] [place], [place] [address]  Hepatic function panel     Status: Abnormal   Collection Time: 10/29/24  5:31 AM  Result Value Ref Range   Total Protein 5.5 (L) 6.5 - 8.1 g/dL   Albumin  3.1 (L) 3.5 - 5.0 g/dL   AST 17 15 - 41 U/L   ALT 10 0 - 44 U/L   Alkaline Phosphatase 63 38 - 126 U/L   Total Bilirubin 0.4 0.0 - 1.2 mg/dL   Bilirubin, Direct 0.2 0.0 - 0.2 mg/dL   Indirect Bilirubin 0.2 (L) 0.3 - 0.9 mg/dL    Comment: Performed at [facility], [address] [place], [place] [address]  CBC with Differential/Platelet     Status: Abnormal   Collection Time: 10/29/24  5:31 AM  Result Value Ref Range   WBC 10.9 (H) 4.0 - 10.5 K/uL   RBC 3.90 3.87 - 5.11 MIL/uL   Hemoglobin 12.0 12.0 - 15.0 g/dL   HCT 66.4 (L) 63.9 - 53.9 %   MCV 85.9 80.0 - 100.0 fL   MCH 30.8 26.0 - 34.0 pg   MCHC 35.8 30.0 - 36.0 g/dL   RDW 86.5 88.4 - 84.4 %   Platelets 394 150 - 400 K/uL   nRBC 0.0 0.0 - 0.2 %   Neutrophils Relative % 72 %   Neutro Abs 7.9 (H) 1.7 - 7.7 K/uL   Lymphocytes Relative 13 %   Lymphs Abs 1.4 0.7 - 4.0 K/uL   Monocytes Relative 13 %   Monocytes Absolute 1.4 (H) 0.1 - 1.0 K/uL   Eosinophils Relative 0 %   Eosinophils Absolute 0.0 0.0 - 0.5 K/uL   Basophils Relative 1 %   Basophils Absolute 0.1 0.0 - 0.1 K/uL   Immature Granulocytes 1 %   Abs Immature Granulocytes 0.05 0.00 - 0.07 K/uL    Comment: Performed at [facility], [address], [place], [place] [address]  TSH     Status: None   Collection Time:  10/29/24  5:31 AM  Result Value Ref Range   TSH 2.960 0.350 - 4.500 uIU/mL    Comment: Performed at [facility], [address], [place], [place] [address]  Cortisol     Status: None   Collection Time: 10/29/24  5:31 AM  Result Value Ref Range   Cortisol, Plasma 17.9 ug/dL    Comment: (NOTE) AM    6.7 - 22.6 ug/dL PM   <89.9       ug/dL Performed at [facility], [address], [place], [place] [address]   Urinalysis, w/ Reflex to Culture (Infection Suspected) -Urine, Clean Catch     Status: Abnormal   Collection Time: 10/29/24  9:41 AM  Result Value Ref Range   Specimen Source URINE, CLEAN CATCH    Color, Urine YELLOW YELLOW   APPearance HAZY (A) CLEAR   Specific Gravity, Urine 1.030 1.005 - 1.030   pH 6.0 5.0 - 8.0   Glucose, UA NEGATIVE NEGATIVE mg/dL   Hgb urine dipstick SMALL (A) NEGATIVE   Bilirubin Urine NEGATIVE NEGATIVE   Ketones, ur 20 (A) NEGATIVE mg/dL   Protein, ur 30 (A) NEGATIVE mg/dL   Nitrite NEGATIVE NEGATIVE   Leukocytes,Ua LARGE (A) NEGATIVE   RBC / HPF 0-5 0 - 5 RBC/hpf   WBC, UA >50 0 - 5 WBC/hpf    Comment:        Reflex urine  culture not performed if WBC <=10, OR if Squamous epithelial cells >5. If Squamous epithelial cells >5 suggest recollection.    Bacteria, UA NONE SEEN NONE SEEN   Squamous Epithelial / HPF 0-5 0 - 5 /HPF   Mucus PRESENT     Comment: Performed at [facility], [address] [place], [place] [address]  Basic metabolic panel     Status: Abnormal   Collection Time: 10/29/24  9:47 AM  Result Value Ref Range   Sodium 125 (L) 135 - 145 mmol/L   Potassium 2.8 (L) 3.5 - 5.1 mmol/L   Chloride 90 (L) 98 - 111 mmol/L   CO2 22 22 - 32 mmol/L   Glucose, Bld 85 70 - 99 mg/dL    Comment: Glucose reference range applies only to samples taken after fasting for at least 8 hours.   BUN 6 (L) 8 - 23 mg/dL   Creatinine, Ser 9.35 0.44 - 1.00 mg/dL   Calcium  8.4 (L) 8.9 - 10.3 mg/dL   GFR,  Estimated >39 >39 mL/min    Comment: (NOTE) Calculated using the CKD-EPI Creatinine Equation (2021)    Anion gap 14 5 - 15    Comment: Performed at [facility], [address] [place], [place] [address]  Magnesium      Status: Abnormal   Collection Time: 10/29/24  9:47 AM  Result Value Ref Range   Magnesium  7.0 (HH) 1.7 - 2.4 mg/dL    Comment: Critical Value, Read Back and verified with VERDON, C RN AT 1100 ON 10/29/2024 BY PRUDY POUR Performed at [facility], [address] [place], [place] [address]   Phosphorus     Status: None   Collection Time: 10/29/24  9:47 AM  Result Value Ref Range   Phosphorus 3.3 2.5 - 4.6 mg/dL    Comment: Performed at [facility], [address] [place], [place] [address]  Sodium, urine, random     Status: None   Collection Time: 10/29/24  9:48 AM  Result Value Ref Range   Sodium, Ur <30 mmol/L    Comment: NO NORMAL RANGE ESTABLISHED FOR THIS TEST Performed at [facility], [address], [place], [place] [address]     Imaging / Studies: DG Abd Portable 1 View Result Date: 10/28/2024 EXAM: 1 VIEW XRAY OF THE ABDOMEN 10/28/2024 07:17:00 PM COMPARISON: CT abdomen and pelvis 11/26 CLINICAL HISTORY: post NG tube insertion FINDINGS: LINES, TUBES AND DEVICES: Enteric tube in place with tip and side port below the diaphragm overlying the expected region of the stomach. BOWEL: Mild gaseous distention of small bowel and colon in the included abdomen. Hiatal hernia. SOFT TISSUES: No abnormal calcifications. BONES: No acute fracture. IMPRESSION: 1. Enteric tube in place with tip and side port below the diaphragm overlying the expected region of the stomach. 2. Mild gaseous distention of small bowel and colon in the included abdomen. Electronically signed by: Morgane Naveau MD MD 10/28/2024 07:20 PM EST RP Workstation: [id number]   CT ABDOMEN PELVIS W CONTRAST Result Date:  10/28/2024 EXAM: CT ABDOMEN AND PELVIS WITH CONTRAST 10/17/2024 03:16:21 PM TECHNIQUE: CT of the abdomen and pelvis was performed with the administration of 100 mL of iohexol  (OMNIPAQUE ) 300 MG/ML solution. Multiplanar reformatted images are provided for review. Automated exposure control, iterative reconstruction, and/or weight-based adjustment of the mA/kV was utilized to reduce the radiation dose to as low as reasonably achievable. COMPARISON: 10/01/2024 CLINICAL HISTORY: Diverticulitis, complication suspected; follow up diverticulitis  with abscess not amenable to IR drainage. Perforated diverticulitis with multiple intra-abdominal abscesses. FINDINGS: LOWER CHEST: No acute abnormality. LIVER: The liver is unremarkable. GALLBLADDER AND BILE DUCTS: Gallbladder is unremarkable. No biliary ductal dilatation. SPLEEN: No acute abnormality. PANCREAS: No acute abnormality. ADRENAL GLANDS: No acute abnormality. KIDNEYS, URETERS AND BLADDER: No stones in the kidneys or ureters. No hydronephrosis. No perinephric or periureteral stranding. Small amount of nondependent gas is seen within the bladder lumen. The larger pericolonic abscess (1.8 x 3.0 cm, series 2, image 66) abuts the bladder dome. Development of an indirect colovesicular fistula by way of the abscess cavity is not excluded. Correlation with urinalysis and urine culture may be helpful for confirmation. GI AND BOWEL: There is circumferential mural thickening and submucosal edema in the mid sigmoid colon, similar to prior examination. There is infiltrative soft tissue within the adjacent sigmoid mesentery (series 2, image 60) which may reflect inflammatory tissue though infiltrative disease as can be seen with a perforated malignancy, could appear similarly. Previously noted pericolonic abscesses have significantly decreased in size, measuring 1.8 x 3.0 cm (series 2, image 66) and 2.0 x 1.1 cm (series 2, image 63). Small amount of gas and fluid is seen within the  larger collection. Proximal to the area of inflammatory change involving the mid sigmoid colon, the colon is unremarkable. Stomach and small bowel are unremarkable. There is no bowel obstruction. PERITONEUM AND RETROPERITONEUM: No ascites. No free air. VASCULATURE: Aorta is normal in caliber. LYMPH NODES: No lymphadenopathy. REPRODUCTIVE ORGANS: Calcified involuted fibroid within the uterus. Pelvic organs are otherwise unremarkable. BONES AND SOFT TISSUES: Osseous structures are age appropriate. No acute osseous abnormality. No lytic or blastic bone lesion. No focal soft tissue abnormality. IMPRESSION: 1. Findings compatible with perforated sigmoid diverticulitis, with improving pericolonic abscesses, and persistent adjacent sigmoid mesenteric soft tissue which may be inflammatory; underlying perforated malignancy is not excluded. Correlation with endoscopy may be helpful once the patient's acute issues have resolved. 2. Gas and fluid within the larger pericolonic collection abutting the bladder dome with new intravesical gas, and an indirect colovesical fistula is not excluded; consider urinalysis and urine culture. Electronically signed by: Dorethia Molt MD MD 10/28/2024 06:38 PM EST RP Workstation: [id number]   CT ABDOMEN PELVIS W CONTRAST Result Date: 10/28/2024 EXAM: CT ABDOMEN AND PELVIS WITH CONTRAST 10/28/2024 06:17:07 PM TECHNIQUE: CT of the abdomen and pelvis was performed with the administration of intravenous contrast. Multiplanar reformatted images are provided for review. Automated exposure control, iterative reconstruction, and/or weight-based adjustment of the mA/kV was utilized to reduce the radiation dose to as low as reasonably achievable. COMPARISON: 10/17/2024 and 10/01/2024 CLINICAL HISTORY: Unspecified abdominal pain, nausea, diarrhea, complicated diverticulitis with multiple intraabdominal abscesses. FINDINGS: LOWER CHEST: Right basilar discoid atelectasis. Small hiatal hernia. LIVER: The  liver is unremarkable. GALLBLADDER AND BILE DUCTS: Gallbladder is unremarkable. No biliary ductal dilatation. SPLEEN: No acute abnormality. PANCREAS: No acute abnormality. ADRENAL GLANDS: No acute abnormality. KIDNEYS, URETERS AND BLADDER: No stones in the kidneys or ureters. No hydronephrosis. No perinephric or periureteral stranding. There has developed circumferential bladder wall thickening and prominent mucosal hyperemia suggesting a developing infectious or inflammatory cystitis. The bladder, however, is decompressed. There is suggestion of a possible indirect colovesicular fistula by way of the abscess cavity best appreciated on coronal image 67 - 70, series 7. Correlation with urine culture may be helpful for confirmation. GI AND BOWEL: Stomach and small bowel were unremarkable. Appendix normal. The previously identified intra-abdominal abscesses continued to improve and now have  nearly resolved with the residual abscess cavities measuring 1.7 x 2.5 cm, containing no significant drainable fluid (image 75, series 2), and 12.5 x 2.4 cm containing a punctate focus of gas (image 73, series 2). However, a large bowel obstruction has developed with gas and fluid distention of the colon proximal to the mid sigmoid colon where there is irregular transition to a decompressed sigmoid colon (image 72, series 2). While this may simply relate to edema or post inflammatory scarring, an underlying cicatricial mass is difficult to exclude on this examination. The cecum is dilated up to 9 cm in diameter. PERITONEUM AND RETROPERITONEUM: Interval development of mild ascites. No free intraperitoneal gas or free air. VASCULATURE: Aorta is normal in caliber. LYMPH NODES: No lymphadenopathy. REPRODUCTIVE ORGANS: Calcified inverted fibroid within the uterus. Pelvic organs are otherwise unremarkable. BONES AND SOFT TISSUES: Osseous structures are age appropriate. No acute bone abnormality. No lytic or blastic bone lesion. No focal  soft tissue abnormality. IMPRESSION: 1. Large bowel obstruction with gas and fluid distention of the colon proximal to the mid sigmoid colon, where there is irregular transition to a decompressed sigmoid colon, with cecal dilation up to 9 cm in diameter; an underlying cicatricial mass is difficult to exclude. Correlation with endoscopy may be helpful for further evaluation. 2. Circumferential bladder wall thickening and prominent mucosal hyperemia suggesting developing infectious or inflammatory cystitis, with suggestion of a possible indirect colovesicular fistula by way of the abscess cavity; urinalysis and urine culture may help confirm. 3. Continued resolution of the intra-abdominal abscesses with Residual intra-abdominal abscess cavities measuring 1.7 x 2.5 cm and 12.5 x 2.4 cm, the latter containing a punctate focus of gas. No residual drainable fluid component identified. 4. Interval development of mild ascites. 5. Small hiatal hernia. Electronically signed by: Dorethia Molt MD MD 10/28/2024 06:31 PM EST RP Workstation: [id number]   DG Chest 2 View Result Date: 10/01/2024 EXAM: 2 VIEW(S) XRAY OF THE CHEST 10/01/2024 07:44:00 PM COMPARISON: None available. CLINICAL HISTORY: SOB FINDINGS: LUNGS AND PLEURA: No focal pulmonary opacity. No pleural effusion. No pneumothorax. HEART AND MEDIASTINUM: Aortic atherosclerosis. No acute abnormality of the cardiac and mediastinal silhouettes. BONES AND SOFT TISSUES: Multilevel thoracic osteophytosis. IMPRESSION: 1. No acute cardiopulmonary process. 2. Aortic atherosclerosis. 3. Multilevel thoracic osteophytosis. Electronically signed by: Greig Pique MD 10/01/2024 07:56 PM EST RP Workstation: [id number]   CT ABDOMEN PELVIS W CONTRAST Result Date: 10/01/2024 EXAM: CT ABDOMEN AND PELVIS WITH CONTRAST 10/01/2024 07:33:38 PM TECHNIQUE: CT of the abdomen and pelvis was performed with the administration of intravenous contrast. 80 mL of iohexol  (OMNIPAQUE ) 300 MG/ML  solution was administered. Multiplanar reformatted images are provided for review. Automated exposure control, iterative reconstruction, and/or weight-based adjustment of the mA/kV was utilized to reduce the radiation dose to as low as reasonably achievable. COMPARISON: None available. CLINICAL HISTORY: RLQ abdominal pain; RLQ and RUQ abd ttp -- concern for malignancy vs appendicitis. FINDINGS: LOWER CHEST: No acute abnormality. LIVER: The liver is unremarkable. GALLBLADDER AND BILE DUCTS: Small gallstone is present. No biliary ductal dilatation. SPLEEN: No acute abnormality. PANCREAS: No acute abnormality. ADRENAL GLANDS: No acute abnormality. 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 There is wall thickening of the dome of the bladder. No stones in the kidneys or ureters. No hydronephrosis. No perinephric or periureteral stranding. GI AND BOWEL: Stomach appears normal. There is marked wall thickening of the sigmoid colon. There is surrounding inflammatory stranding. Enhancing fluid collection with air which appears multiloculated seen adjacent  to the right margin of the sigmoid colon measuring 5.4 x 3.8 x 6.2 cm. A smaller air fluid collection is seen along the left side of the sigmoid colon measuring 1.1 x 3.4 x 2.1 cm. This abuts the dome of the bladder. There is surrounding inflammatory stranding. There are numerous air fluid levels scattered throughout small bowel loops. There is no evidence for bowel obstruction. The appendix is not visualized. PERITONEUM AND RETROPERITONEUM: There is a small amount of free fluid in the pelvis. No gross free intraperitoneal air. VASCULATURE: Aorta is normal in caliber. LYMPH NODES: No lymphadenopathy. REPRODUCTIVE ORGANS: There is a calcified uterine fibroid in the fundal region measuring 2 cm. The ovaries are not well delineated. BONES AND SOFT TISSUES: No acute osseous abnormality. No focal soft tissue abnormality. IMPRESSION: 1.  Findings consistent with sigmoid diverticulitis or colitis with adjacent abscesses; recommend surgical and/or interventional consultation for management. 2. Small gallstone. 3. Bladder dome wall thickening, likely reactive secondary to adjacent abscess . 4. Calcified 2 cm uterine fibroid. Electronically signed by: Greig Pique MD 10/01/2024 07:50 PM EST RP Workstation: [id number]    Medications / Allergies: per chart  Antibiotics: Anti-infectives (From admission, onward)    Start     Dose/Rate Route Frequency Ordered Stop   10/29/24 0000  piperacillin -tazobactam (ZOSYN ) IVPB 3.375 g        3.375 g 12.5 mL/hr over 240 Minutes Intravenous Every 8 hours 10/28/24 2146     10/28/24 1630  piperacillin -tazobactam (ZOSYN ) IVPB 3.375 g        3.375 g 100 mL/hr over 30 Minutes Intravenous  Once 10/28/24 1624 10/28/24 1753   10/28/24 1600  cefTRIAXone  (ROCEPHIN ) 1 g in sodium chloride  0.9 % 100 mL IVPB  Status:  Discontinued        1 g 200 mL/hr over 30 Minutes Intravenous  Once 10/28/24 1550 10/28/24 1611         Note: Portions of this report may have been transcribed using voice recognition software. Every effort was made to ensure accuracy; however, inadvertent computerized transcription errors may be present.   Any transcriptional errors that result from this process are unintentional.      10/29/2024  12:14 PM       [1] No Known Allergies  "

## 2024-10-29 NOTE — Consult Note (Signed)
 WOC Nurse requested for preoperative stoma site marking  Discussed surgical procedure and possible stoma creation with patient.  Explained role of the [facility] nurse team.  Demonstrated sample pouching options.  Answered patient's questions.   Examined patient lying and sitting upright, since she is in pain, in order to place the marking in the patient's visual field, away from any creases or abdominal contour issues and within the rectus muscle. Attempted to mark below the patient's belt line.  Pt's abd is currently very tight and swollen and may reveal folds which are not visible after the swelling is resolved.   Marked for colostomy in the LLQ  _5___ cm to the left of the umbilicus and __5__cm below the umbilicus.  Marked for ileostomy in the RLQ  __5__cm to the right of the umbilicus and  _5___ cm below the umbilicus.  Patient's abdomen cleansed with CHG wipes at site markings, allowed to air dry prior to marking.Covered mark with thin film transparent dressing to preserve mark until date of surgery.   Please re-consult if patient receives an ostomy during surgery. Thank-you,  Stephane Fought MSN, RN, CWOCN, CWCN-AP, CNS Contact Mon-Fri [phone]: [phone]

## 2024-10-29 NOTE — Progress Notes (Signed)
" °   10/29/24 0927  TOC Brief Assessment  Insurance and Status Reviewed  Patient has primary care physician Yes  Home environment has been reviewed single family home  Prior level of function: independent  Prior/Current Home Services No current home services  Social Drivers of Health Review SDOH reviewed no interventions necessary  Readmission risk has been reviewed Yes  Transition of care needs no transition of care needs at this time    Signed: Heather Saltness, MSW, LCSW Clinical Social Worker Inpatient Care Management 10/29/2024 9:28 AM   "

## 2024-10-30 ENCOUNTER — Encounter (HOSPITAL_COMMUNITY): Payer: Self-pay | Admitting: Internal Medicine

## 2024-10-30 ENCOUNTER — Encounter (HOSPITAL_COMMUNITY)
Admission: EM | Disposition: A | Discharge status: Home Health | Payer: Self-pay | Source: Home / Self Care | Attending: Internal Medicine

## 2024-10-30 ENCOUNTER — Inpatient Hospital Stay (HOSPITAL_COMMUNITY): Admitting: Anesthesiology

## 2024-10-30 DIAGNOSIS — K56609 Unspecified intestinal obstruction, unspecified as to partial versus complete obstruction: Secondary | ICD-10-CM | POA: Diagnosis not present

## 2024-10-30 DIAGNOSIS — F3163 Bipolar disorder, current episode mixed, severe, without psychotic features: Secondary | ICD-10-CM | POA: Diagnosis not present

## 2024-10-30 DIAGNOSIS — K5720 Diverticulitis of large intestine with perforation and abscess without bleeding: Secondary | ICD-10-CM | POA: Diagnosis not present

## 2024-10-30 DIAGNOSIS — E871 Hypo-osmolality and hyponatremia: Secondary | ICD-10-CM | POA: Diagnosis not present

## 2024-10-30 DIAGNOSIS — N321 Vesicointestinal fistula: Secondary | ICD-10-CM | POA: Insufficient documentation

## 2024-10-30 HISTORY — PX: LAPAROSCOPY: SHX197

## 2024-10-30 HISTORY — PX: CYSTOSCOPY WITH INDOCYANINE GREEN IMAGING (ICG): SHX7549

## 2024-10-30 HISTORY — PX: COLECTOMY WITH COLOSTOMY CREATION/HARTMANN PROCEDURE: SHX6598

## 2024-10-30 LAB — URINE CULTURE: Culture: NO GROWTH

## 2024-10-30 LAB — CBC
HCT: 38 % (ref 36.0–46.0)
Hemoglobin: 13.1 g/dL (ref 12.0–15.0)
MCH: 30.4 pg (ref 26.0–34.0)
MCHC: 34.5 g/dL (ref 30.0–36.0)
MCV: 88.2 fL (ref 80.0–100.0)
Platelets: 491 K/uL — ABNORMAL HIGH (ref 150–400)
RBC: 4.31 MIL/uL (ref 3.87–5.11)
RDW: 13.7 % (ref 11.5–15.5)
WBC: 13.4 K/uL — ABNORMAL HIGH (ref 4.0–10.5)
nRBC: 0 % (ref 0.0–0.2)

## 2024-10-30 LAB — BASIC METABOLIC PANEL WITH GFR
Anion gap: 18 — ABNORMAL HIGH (ref 5–15)
Anion gap: 16 — ABNORMAL HIGH (ref 5–15)
BUN: 9 mg/dL (ref 8–23)
BUN: 10 mg/dL (ref 8–23)
CO2: 19 mmol/L — ABNORMAL LOW (ref 22–32)
CO2: 18 mmol/L — ABNORMAL LOW (ref 22–32)
Calcium: 8.6 mg/dL — ABNORMAL LOW (ref 8.9–10.3)
Calcium: 7.8 mg/dL — ABNORMAL LOW (ref 8.9–10.3)
Chloride: 91 mmol/L — ABNORMAL LOW (ref 98–111)
Chloride: 94 mmol/L — ABNORMAL LOW (ref 98–111)
Creatinine, Ser: 0.76 mg/dL (ref 0.44–1.00)
Creatinine, Ser: 0.77 mg/dL (ref 0.44–1.00)
GFR, Estimated: >60 mL/min
GFR, Estimated: >60 mL/min
Glucose, Bld: 74 mg/dL (ref 70–99)
Glucose, Bld: 118 mg/dL — ABNORMAL HIGH (ref 70–99)
Potassium: 3.5 mmol/L (ref 3.5–5.1)
Potassium: 3.8 mmol/L (ref 3.5–5.1)
Sodium: 127 mmol/L — ABNORMAL LOW (ref 135–145)
Sodium: 128 mmol/L — ABNORMAL LOW (ref 135–145)

## 2024-10-30 LAB — MAGNESIUM
Magnesium: 2 mg/dL (ref 1.7–2.4)
Magnesium: 1.9 mg/dL (ref 1.7–2.4)

## 2024-10-30 LAB — ABO/RH: ABO/RH(D): A POS

## 2024-10-30 LAB — TYPE AND SCREEN
ABO/RH(D): A POS
Antibody Screen: NEGATIVE

## 2024-10-30 LAB — HEMOGLOBIN A1C
Hgb A1c MFr Bld: 5.4 % (ref 4.8–5.6)
Mean Plasma Glucose: 108.28 mg/dL

## 2024-10-30 MED ORDER — PROPOFOL 10 MG/ML IV BOLUS
INTRAVENOUS | Status: DC | PRN
  Administered 2024-10-30: 140 mg via INTRAVENOUS

## 2024-10-30 MED ORDER — FENTANYL CITRATE (PF) 100 MCG/2ML IJ SOLN
INTRAMUSCULAR | Status: DC | PRN
  Administered 2024-10-30: 50 ug via INTRAVENOUS
  Administered 2024-10-30: 100 ug via INTRAVENOUS
  Administered 2024-10-30: 50 ug via INTRAVENOUS

## 2024-10-30 MED ORDER — BUPIVACAINE-EPINEPHRINE (PF) 0.25% -1:200000 IJ SOLN
INTRAMUSCULAR | Status: AC
  Filled 2024-10-30: qty 60

## 2024-10-30 MED ORDER — HYDROMORPHONE HCL 1 MG/ML IJ SOLN: 0.2500 mg | INTRAMUSCULAR | Status: DC | PRN

## 2024-10-30 MED ORDER — ALBUMIN HUMAN 5 % IV SOLN
INTRAVENOUS | Status: AC
  Filled 2024-10-30: qty 250

## 2024-10-30 MED ORDER — METOPROLOL TARTRATE 5 MG/5ML IV SOLN: 5.0000 mg | Freq: Four times a day (QID) | INTRAVENOUS | Status: DC | PRN

## 2024-10-30 MED ORDER — SODIUM CHLORIDE 0.9 % IR SOLN
Status: DC | PRN
  Administered 2024-10-30 (×2): 1000 mL

## 2024-10-30 MED ORDER — PHENYLEPHRINE HCL (PRESSORS) 10 MG/ML IV SOLN
INTRAVENOUS | Status: AC
  Filled 2024-10-30: qty 1

## 2024-10-30 MED ORDER — PIPERACILLIN-TAZOBACTAM 3.375 G IVPB
3.3750 g | Freq: Three times a day (TID) | INTRAVENOUS | Status: AC
  Administered 2024-10-30 – 2024-11-04 (×15): 3.375 g via INTRAVENOUS
  Filled 2024-10-30 (×15): qty 50

## 2024-10-30 MED ORDER — METHYLENE BLUE (ANTIDOTE) 1 % IV SOLN
INTRAVENOUS | Status: DC | PRN
  Administered 2024-10-30: 10 mL

## 2024-10-30 MED ORDER — HYDROMORPHONE HCL 1 MG/ML IJ SOLN
0.5000 mg | INTRAMUSCULAR | Status: DC | PRN
  Administered 2024-10-31 – 2024-11-02 (×9): 1 mg via INTRAVENOUS
  Filled 2024-10-30 (×9): qty 1

## 2024-10-30 MED ORDER — STERILE WATER FOR INJECTION IJ SOLN
INTRAMUSCULAR | Status: DC | PRN
  Administered 2024-10-30: 15 mL via INTRAMUSCULAR

## 2024-10-30 MED ORDER — SUGAMMADEX SODIUM 200 MG/2ML IV SOLN
INTRAVENOUS | Status: DC | PRN
  Administered 2024-10-30: 200 mg via INTRAVENOUS

## 2024-10-30 MED ORDER — MAGIC MOUTHWASH: 15.0000 mL | Freq: Four times a day (QID) | ORAL | Status: DC | PRN

## 2024-10-30 MED ORDER — PHENOL 1.4 % MT LIQD: 2.0000 | OROMUCOSAL | Status: DC | PRN

## 2024-10-30 MED ORDER — LACTATED RINGERS IV SOLN: INTRAVENOUS | Status: DC | PRN

## 2024-10-30 MED ORDER — SUCCINYLCHOLINE CHLORIDE 200 MG/10ML IV SOSY
PREFILLED_SYRINGE | INTRAVENOUS | Status: DC | PRN
  Administered 2024-10-30: 70 mg via INTRAVENOUS

## 2024-10-30 MED ORDER — MENTHOL 3 MG MT LOZG: 1.0000 | LOZENGE | OROMUCOSAL | Status: DC | PRN

## 2024-10-30 MED ORDER — OXYCODONE HCL 5 MG/5ML PO SOLN: 5.0000 mg | Freq: Once | ORAL | Status: DC | PRN

## 2024-10-30 MED ORDER — STERILE WATER FOR INJECTION IJ SOLN
INTRAMUSCULAR | Status: AC
  Filled 2024-10-30: qty 10

## 2024-10-30 MED ORDER — PHENYLEPHRINE HCL-NACL 20-0.9 MG/250ML-% IV SOLN
INTRAVENOUS | Status: DC | PRN
  Administered 2024-10-30: 50 ug/min via INTRAVENOUS

## 2024-10-30 MED ORDER — SALINE SPRAY 0.65 % NA SOLN: 1.0000 | Freq: Four times a day (QID) | NASAL | Status: DC | PRN

## 2024-10-30 MED ORDER — PROPOFOL 10 MG/ML IV BOLUS
INTRAVENOUS | Status: AC
  Filled 2024-10-30: qty 20

## 2024-10-30 MED ORDER — SIMETHICONE 40 MG/0.6ML PO SUSP: 80.0000 mg | Freq: Four times a day (QID) | ORAL | Status: DC | PRN

## 2024-10-30 MED ORDER — DEXAMETHASONE SOD PHOSPHATE PF 10 MG/ML IJ SOLN
INTRAMUSCULAR | Status: AC
  Filled 2024-10-30: qty 1

## 2024-10-30 MED ORDER — FENTANYL CITRATE (PF) 100 MCG/2ML IJ SOLN
INTRAMUSCULAR | Status: AC
  Filled 2024-10-30: qty 2

## 2024-10-30 MED ORDER — KETAMINE HCL 50 MG/5ML IJ SOSY
PREFILLED_SYRINGE | INTRAMUSCULAR | Status: AC
  Filled 2024-10-30: qty 5

## 2024-10-30 MED ORDER — ALUM & MAG HYDROXIDE-SIMETH 200-200-20 MG/5ML PO SUSP: 30.0000 mL | Freq: Four times a day (QID) | ORAL | Status: DC | PRN

## 2024-10-30 MED ORDER — METHOCARBAMOL 1000 MG/10ML IJ SOLN
500.0000 mg | Freq: Three times a day (TID) | INTRAMUSCULAR | Status: DC
  Administered 2024-10-30 – 2024-11-04 (×14): 500 mg via INTRAVENOUS
  Filled 2024-10-30 (×14): qty 10

## 2024-10-30 MED ORDER — METHYLENE BLUE (ANTIDOTE) 1 % IV SOLN
INTRAVENOUS | Status: AC
  Filled 2024-10-30: qty 10

## 2024-10-30 MED ORDER — MIDAZOLAM HCL 2 MG/2ML IJ SOLN
INTRAMUSCULAR | Status: AC
  Filled 2024-10-30: qty 2

## 2024-10-30 MED ORDER — LIDOCAINE HCL (PF) 2 % IJ SOLN
INTRAMUSCULAR | Status: DC | PRN
  Administered 2024-10-30: 50 mg via INTRADERMAL

## 2024-10-30 MED ORDER — ACETAMINOPHEN 10 MG/ML IV SOLN
INTRAVENOUS | Status: DC | PRN
  Administered 2024-10-30: 1000 mg via INTRAVENOUS

## 2024-10-30 MED ORDER — DEXMEDETOMIDINE HCL IN NACL 80 MCG/20ML IV SOLN
INTRAVENOUS | Status: AC
  Filled 2024-10-30: qty 20

## 2024-10-30 MED ORDER — BUPIVACAINE-EPINEPHRINE (PF) 0.25% -1:200000 IJ SOLN
INTRAMUSCULAR | Status: DC | PRN
  Administered 2024-10-30: 60 mL via PERINEURAL

## 2024-10-30 MED ORDER — LACTATED RINGERS IV BOLUS: 1000.0000 mL | Freq: Three times a day (TID) | INTRAVENOUS | Status: AC | PRN

## 2024-10-30 MED ORDER — PHENYLEPHRINE 80 MCG/ML (10ML) SYRINGE FOR IV PUSH (FOR BLOOD PRESSURE SUPPORT)
PREFILLED_SYRINGE | INTRAVENOUS | Status: AC
  Filled 2024-10-30: qty 10

## 2024-10-30 MED ORDER — SUGAMMADEX SODIUM 200 MG/2ML IV SOLN
INTRAVENOUS | Status: AC
  Filled 2024-10-30: qty 2

## 2024-10-30 MED ORDER — PROCHLORPERAZINE EDISYLATE 10 MG/2ML IJ SOLN: 10.0000 mg | Freq: Four times a day (QID) | INTRAMUSCULAR | Status: DC | PRN

## 2024-10-30 MED ORDER — ROCURONIUM BROMIDE 10 MG/ML (PF) SYRINGE
PREFILLED_SYRINGE | INTRAVENOUS | Status: AC
  Filled 2024-10-30: qty 10

## 2024-10-30 MED ORDER — ONDANSETRON HCL 4 MG/2ML IJ SOLN
INTRAMUSCULAR | Status: AC
  Filled 2024-10-30: qty 2

## 2024-10-30 MED ORDER — ROCURONIUM BROMIDE 10 MG/ML (PF) SYRINGE
PREFILLED_SYRINGE | INTRAVENOUS | Status: DC | PRN
  Administered 2024-10-30: 40 mg via INTRAVENOUS
  Administered 2024-10-30: 20 mg via INTRAVENOUS

## 2024-10-30 MED ORDER — ONDANSETRON HCL 4 MG/2ML IJ SOLN
INTRAMUSCULAR | Status: DC | PRN
  Administered 2024-10-30: 4 mg via INTRAVENOUS

## 2024-10-30 MED ORDER — ACETAMINOPHEN 500 MG PO TABS: 1000.0000 mg | ORAL_TABLET | Freq: Once | ORAL | Status: DC

## 2024-10-30 MED ORDER — KETOROLAC TROMETHAMINE 30 MG/ML IJ SOLN
INTRAMUSCULAR | Status: AC
  Filled 2024-10-30: qty 1

## 2024-10-30 MED ORDER — DEXAMETHASONE SOD PHOSPHATE PF 10 MG/ML IJ SOLN
INTRAMUSCULAR | Status: DC | PRN
  Administered 2024-10-30: 10 mg via INTRAVENOUS

## 2024-10-30 MED ORDER — MIDAZOLAM HCL (PF) 2 MG/2ML IJ SOLN: 0.5000 mg | Freq: Once | INTRAMUSCULAR | Status: DC | PRN

## 2024-10-30 MED ORDER — KETOROLAC TROMETHAMINE 30 MG/ML IJ SOLN
INTRAMUSCULAR | Status: DC | PRN
  Administered 2024-10-30: 30 mg via INTRAVENOUS

## 2024-10-30 MED ORDER — MEPERIDINE HCL 25 MG/ML IJ SOLN: 6.2500 mg | INTRAMUSCULAR | Status: DC | PRN

## 2024-10-30 MED ORDER — 0.9 % SODIUM CHLORIDE (POUR BTL) OPTIME
TOPICAL | Status: DC | PRN
  Administered 2024-10-30: 1000 mL

## 2024-10-30 MED ORDER — ACETAMINOPHEN 10 MG/ML IV SOLN
INTRAVENOUS | Status: AC
  Filled 2024-10-30: qty 100

## 2024-10-30 MED ORDER — ALBUMIN HUMAN 5 % IV SOLN: INTRAVENOUS | Status: DC | PRN

## 2024-10-30 MED ORDER — NAPHAZOLINE-GLYCERIN 0.012-0.25 % OP SOLN: 1.0000 [drp] | Freq: Four times a day (QID) | OPHTHALMIC | Status: DC | PRN

## 2024-10-30 MED ORDER — OXYCODONE HCL 5 MG PO TABS: 5.0000 mg | ORAL_TABLET | Freq: Once | ORAL | Status: DC | PRN

## 2024-10-30 NOTE — Interval H&P Note (Signed)
 History and Physical Interval Note:  10/30/2024 7:36 AM  Michelle Levy  has presented today for surgery, with the diagnosis of obstructing colon.  The various methods of treatment have been discussed with the patient and family. After consideration of risks, benefits and other options for treatment, the patient has consented to  Procedures: COLECTOMY, PARTIAL, ROBOT-ASSISTED, LAPAROSCOPIC (N/A) CYSTOSCOPY WITH INDOCYANINE GREEN  IMAGING (ICG) (Bilateral) as a surgical intervention.  The patient's history has been reviewed, patient examined, no change in status, stable for surgery.  I have reviewed the patient's chart and labs.  Questions were answered to the patient's satisfaction.     The anatomy & physiology of the digestive tract was discussed.  The pathophysiology of digestive tract dysfunction including obstruction and/or leak discussed.  Natural history risks without surgery was discussed.   I feel the risks of no intervention will lead to serious problems that outweigh the operative risks; therefore, I recommended fecal diversion with laparoscopic possible open diverting ostomy.   Minimally invasive (Robotic/Laparoscopic) & open techniques were discussed.   Risks such as bleeding, infection, abscess, leak, reoperation, injury to other organs, need for repair of tissues / organs, hernia, heart attack, stroke, death, and other risks were discussed.  I noted a good likelihood this will help address the problem.   Goals of post-operative recovery were discussed as well.   Need for adequate nutrition, daily bowel regimen and healthy physical activity, to optimize recovery was noted as well. We will work to minimize complications.  Educational materials were available as well.  Questions were answered.  The patient expresses understanding & wishes to proceed with surgery.   Michelle Levy

## 2024-10-30 NOTE — Consult Note (Signed)
 Urology Consult   Physician requesting consult: Elspeth Schultze, MD  Reason for consult: Ureteral firefly injections   History of Present Illness: Michelle Levy is a 69 y.o. female with a diverticular abscess necessitating abdominal exploration and diverting colostomy.  Urology has been consulted to perform cystoscopy with ureteral firefly injections.  The patient denies a history of voiding or storage urinary symptoms, hematuria, UTIs, STDs, urolithiasis, GU malignancy/trauma/surgery.  Past Medical History:  Diagnosis Date   Alcohol use disorder, severe, dependence (HCC) 11/24/2015   Bereavement 11/24/2015   Bipolar affective disorder, current episode manic (HCC) 09/07/2023   Diverticulitis     Past Surgical History:  Procedure Laterality Date   CESAREAN SECTION      Current Hospital Medications:  Home Meds: Active Medications[1]  Scheduled Meds:  acetaminophen   1,000 mg Oral Once   alvimopan   12 mg Oral On Call to OR   Continuous Infusions:  acetaminophen      cefoTEtan  (CEFOTAN ) IV     [MAR Hold] piperacillin -tazobactam (ZOSYN )  IV Stopped (10/30/24 0344)   PRN Meds:.acetaminophen , [MAR Hold] prochlorperazine   Allergies: Allergies[2]  Family History  Problem Relation Age of Onset   Alcohol abuse Mother    Bipolar disorder Mother    Alcohol abuse Father    Depression Sister    Bipolar disorder Sister    Breast cancer Neg Hx     Social History:  reports that she has never smoked. She has never used smokeless tobacco. She reports that she does not drink alcohol and does not use drugs.  ROS: A complete review of systems was performed.  All systems are negative except for pertinent findings as noted.  Physical Exam:  Vital signs in last 24 hours: Temp:  [98.2 F (36.8 C)-98.8 F (37.1 C)] 98.8 F (37.1 C) (01/13 0624) Pulse Rate:  [68-93] 93 (01/13 0624) Resp:  [16-23] 23 (01/13 0624) BP: (122-139)/(61-78) 139/78 (01/13 0624) SpO2:  [97 %-99 %] 97 % (01/13  0624) Constitutional:  Alert and oriented, No acute distress Cardiovascular: Regular rate and rhythm, No JVD Respiratory: Normal respiratory effort, Lungs clear bilaterally GI: Abdomen is soft, nontender, nondistended, no abdominal masses GU: No CVA tenderness Lymphatic: No lymphadenopathy Neurologic: Grossly intact, no focal deficits Psychiatric: Normal mood and affect  Laboratory Data:  Recent Labs    10/28/24 1226 10/29/24 0531 10/30/24 0557  WBC 12.6* 10.9* 13.4*  HGB 13.7 12.0 13.1  HCT 38.1 33.5* 38.0  PLT 460* 394 491*    Recent Labs    10/28/24 1226 10/28/24 2215 10/29/24 0531 10/29/24 0947 10/29/24 1704  NA 123* 120* 125* 125* 126*  K 3.5 3.7 2.7* 2.8* 3.2*  CL 84* 87* 89* 90* 92*  GLUCOSE 114* 98 86 85 72  BUN 10 8 7* 6* 7*  CALCIUM  9.3 8.2* 8.3* 8.4* 8.2*  CREATININE 0.85 0.64 0.67 0.64 0.59     Results for orders placed or performed during the hospital encounter of 10/28/24 (from the past 24 hours)  Urinalysis, w/ Reflex to Culture (Infection Suspected) -Urine, Clean Catch     Status: Abnormal   Collection Time: 10/29/24  9:41 AM  Result Value Ref Range   Specimen Source URINE, CLEAN CATCH    Color, Urine YELLOW YELLOW   APPearance HAZY (A) CLEAR   Specific Gravity, Urine 1.030 1.005 - 1.030   pH 6.0 5.0 - 8.0   Glucose, UA NEGATIVE NEGATIVE mg/dL   Hgb urine dipstick SMALL (A) NEGATIVE   Bilirubin Urine NEGATIVE NEGATIVE   Ketones,  ur 20 (A) NEGATIVE mg/dL   Protein, ur 30 (A) NEGATIVE mg/dL   Nitrite NEGATIVE NEGATIVE   Leukocytes,Ua LARGE (A) NEGATIVE   RBC / HPF 0-5 0 - 5 RBC/hpf   WBC, UA >50 0 - 5 WBC/hpf   Bacteria, UA NONE SEEN NONE SEEN   Squamous Epithelial / HPF 0-5 0 - 5 /HPF   Mucus PRESENT   Basic metabolic panel     Status: Abnormal   Collection Time: 10/29/24  9:47 AM  Result Value Ref Range   Sodium 125 (L) 135 - 145 mmol/L   Potassium 2.8 (L) 3.5 - 5.1 mmol/L   Chloride 90 (L) 98 - 111 mmol/L   CO2 22 22 - 32 mmol/L    Glucose, Bld 85 70 - 99 mg/dL   BUN 6 (L) 8 - 23 mg/dL   Creatinine, Ser 9.35 0.44 - 1.00 mg/dL   Calcium  8.4 (L) 8.9 - 10.3 mg/dL   GFR, Estimated >39 >39 mL/min   Anion gap 14 5 - 15  Magnesium      Status: Abnormal   Collection Time: 10/29/24  9:47 AM  Result Value Ref Range   Magnesium  7.0 (HH) 1.7 - 2.4 mg/dL  Phosphorus     Status: None   Collection Time: 10/29/24  9:47 AM  Result Value Ref Range   Phosphorus 3.3 2.5 - 4.6 mg/dL  Sodium, urine, random     Status: None   Collection Time: 10/29/24  9:48 AM  Result Value Ref Range   Sodium, Ur <30 mmol/L  Osmolality, urine     Status: None   Collection Time: 10/29/24  9:48 AM  Result Value Ref Range   Osmolality, Ur 377 300 - 900 mOsm/kg  Basic metabolic panel with GFR     Status: Abnormal   Collection Time: 10/29/24  5:04 PM  Result Value Ref Range   Sodium 126 (L) 135 - 145 mmol/L   Potassium 3.2 (L) 3.5 - 5.1 mmol/L   Chloride 92 (L) 98 - 111 mmol/L   CO2 21 (L) 22 - 32 mmol/L   Glucose, Bld 72 70 - 99 mg/dL   BUN 7 (L) 8 - 23 mg/dL   Creatinine, Ser 9.40 0.44 - 1.00 mg/dL   Calcium  8.2 (L) 8.9 - 10.3 mg/dL   GFR, Estimated >39 >39 mL/min   Anion gap 13 5 - 15  Magnesium      Status: None   Collection Time: 10/29/24  5:04 PM  Result Value Ref Range   Magnesium  1.9 1.7 - 2.4 mg/dL  CBC     Status: Abnormal   Collection Time: 10/30/24  5:57 AM  Result Value Ref Range   WBC 13.4 (H) 4.0 - 10.5 K/uL   RBC 4.31 3.87 - 5.11 MIL/uL   Hemoglobin 13.1 12.0 - 15.0 g/dL   HCT 61.9 63.9 - 53.9 %   MCV 88.2 80.0 - 100.0 fL   MCH 30.4 26.0 - 34.0 pg   MCHC 34.5 30.0 - 36.0 g/dL   RDW 86.2 88.4 - 84.4 %   Platelets 491 (H) 150 - 400 K/uL   nRBC 0.0 0.0 - 0.2 %   Recent Results (from the past 240 hours)  Blood culture (routine x 2)     Status: None (Preliminary result)   Collection Time: 10/28/24  4:01 PM   Specimen: BLOOD  Result Value Ref Range Status   Specimen Description   Final    BLOOD BLOOD RIGHT HAND Performed  at [facility], [address] [place], [place] [address]    Special Requests   Final    BOTTLES DRAWN AEROBIC AND ANAEROBIC Blood Culture results may not be optimal due to an inadequate volume of blood received in culture bottles Performed at [facility], [address], [place], [place] [address]    Culture   Final    NO GROWTH < 24 HOURS Performed at [facility], [address] [place], [place] [address]    Report Status PENDING  Incomplete    Renal Function: Recent Labs    10/28/24 1226 10/28/24 2215 10/29/24 0531 10/29/24 0947 10/29/24 1704  CREATININE 0.85 0.64 0.67 0.64 0.59   Estimated Creatinine Clearance: 54.1 mL/min (by C-G formula based on SCr of 0.59 mg/dL).  Radiologic Imaging: DG Abd Portable 1 View Result Date: 10/28/2024 EXAM: 1 VIEW XRAY OF THE ABDOMEN 10/28/2024 07:17:00 PM COMPARISON: CT abdomen and pelvis 11/26 CLINICAL HISTORY: post NG tube insertion FINDINGS: LINES, TUBES AND DEVICES: Enteric tube in place with tip and side port below the diaphragm overlying the expected region of the stomach. BOWEL: Mild gaseous distention of small bowel and colon in the included abdomen. Hiatal hernia. SOFT TISSUES: No abnormal calcifications. BONES: No acute fracture. IMPRESSION: 1. Enteric tube in place with tip and side port below the diaphragm overlying the expected region of the stomach. 2. Mild gaseous distention of small bowel and colon in the included abdomen. Electronically signed by: Morgane Naveau MD MD 10/28/2024 07:20 PM EST RP Workstation: [id number]   CT ABDOMEN PELVIS W CONTRAST Result Date: 10/28/2024 EXAM: CT ABDOMEN AND PELVIS WITH CONTRAST 10/28/2024 06:17:07 PM TECHNIQUE: CT of the abdomen and pelvis was performed with the administration of intravenous contrast. Multiplanar reformatted images are provided for review. Automated exposure control, iterative reconstruction, and/or weight-based adjustment of the  mA/kV was utilized to reduce the radiation dose to as low as reasonably achievable. COMPARISON: 10/17/2024 and 10/01/2024 CLINICAL HISTORY: Unspecified abdominal pain, nausea, diarrhea, complicated diverticulitis with multiple intraabdominal abscesses. FINDINGS: LOWER CHEST: Right basilar discoid atelectasis. Small hiatal hernia. LIVER: The liver is unremarkable. GALLBLADDER AND BILE DUCTS: Gallbladder is unremarkable. No biliary ductal dilatation. SPLEEN: No acute abnormality. PANCREAS: No acute abnormality. ADRENAL GLANDS: No acute abnormality. KIDNEYS, URETERS AND BLADDER: No stones in the kidneys or ureters. No hydronephrosis. No perinephric or periureteral stranding. There has developed circumferential bladder wall thickening and prominent mucosal hyperemia suggesting a developing infectious or inflammatory cystitis. The bladder, however, is decompressed. There is suggestion of a possible indirect colovesicular fistula by way of the abscess cavity best appreciated on coronal image 67 - 70, series 7. Correlation with urine culture may be helpful for confirmation. GI AND BOWEL: Stomach and small bowel were unremarkable. Appendix normal. The previously identified intra-abdominal abscesses continued to improve and now have nearly resolved with the residual abscess cavities measuring 1.7 x 2.5 cm, containing no significant drainable fluid (image 75, series 2), and 12.5 x 2.4 cm containing a punctate focus of gas (image 73, series 2). However, a large bowel obstruction has developed with gas and fluid distention of the colon proximal to the mid sigmoid colon where there is irregular transition to a decompressed sigmoid colon (image 72, series 2). While this may simply relate to edema or post inflammatory scarring, an underlying cicatricial mass is difficult to exclude on this examination. The cecum is dilated up to 9 cm in diameter. PERITONEUM AND RETROPERITONEUM: Interval development of mild ascites. No free  intraperitoneal gas  or free air. VASCULATURE: Aorta is normal in caliber. LYMPH NODES: No lymphadenopathy. REPRODUCTIVE ORGANS: Calcified inverted fibroid within the uterus. Pelvic organs are otherwise unremarkable. BONES AND SOFT TISSUES: Osseous structures are age appropriate. No acute bone abnormality. No lytic or blastic bone lesion. No focal soft tissue abnormality. IMPRESSION: 1. Large bowel obstruction with gas and fluid distention of the colon proximal to the mid sigmoid colon, where there is irregular transition to a decompressed sigmoid colon, with cecal dilation up to 9 cm in diameter; an underlying cicatricial mass is difficult to exclude. Correlation with endoscopy may be helpful for further evaluation. 2. Circumferential bladder wall thickening and prominent mucosal hyperemia suggesting developing infectious or inflammatory cystitis, with suggestion of a possible indirect colovesicular fistula by way of the abscess cavity; urinalysis and urine culture may help confirm. 3. Continued resolution of the intra-abdominal abscesses with Residual intra-abdominal abscess cavities measuring 1.7 x 2.5 cm and 12.5 x 2.4 cm, the latter containing a punctate focus of gas. No residual drainable fluid component identified. 4. Interval development of mild ascites. 5. Small hiatal hernia. Electronically signed by: Dorethia Molt MD MD 10/28/2024 06:31 PM EST RP Workstation: [id number]    I independently reviewed the above imaging studies.  Impression/Recommendation 69 year old female with diverticulitis with colonic abscess and bowel obstruction.  -The risk, benefits and alternatives of cystoscopy with bilateral ureteral firefly injections was discussed with the patient.  She voices understanding and wishes to proceed.  Lonni Han, MD [facility] 10/30/2024, 7:26 AM         [1]  Current Meds  Medication Sig   B Complex-C (B-COMPLEX WITH VITAMIN C) tablet Take 1 tablet by  mouth daily.   Calcium  Carbonate Antacid (CALCIUM  CARBONATE PO) Take 250 mg by mouth 2 (two) times daily.   Cholecalciferol  (VITAMIN D3) 1000 units CAPS Take 1,000 Units by mouth daily.   Cranberry 250 MG CAPS Take 250 mg by mouth daily.   divalproex  (DEPAKOTE  ER) 250 MG 24 hr tablet Take 3 tablets (750 mg total) by mouth at bedtime.   MAGNESIUM  CITRATE PO Take 2 capsules by mouth daily.   Milk Thistle 300 MG CAPS Take 300 mg by mouth daily.   Multiple Vitamin (MULTIVITAMIN PO) Take 1 tablet by mouth daily.   Omega 3-6-9 Fatty Acids (TRIPLE OMEGA COMPLEX) CPDR Take 1 capsule by mouth daily.  [2] No Known Allergies

## 2024-10-30 NOTE — Anesthesia Preprocedure Evaluation (Addendum)
"                                    Anesthesia Evaluation  Patient identified by MRN, date of birth, ID band Patient awake    Reviewed: Allergy & Precautions, NPO status , Patient's Chart, lab work & pertinent test results  History of Anesthesia Complications Negative for: history of anesthetic complications  Airway Mallampati: II  TM Distance: <3 FB Neck ROM: Full    Dental  (+) Dental Advisory Given   Pulmonary neg pulmonary ROS   breath sounds clear to auscultation       Cardiovascular negative cardio ROS  Rhythm:Regular Rate:Normal     Neuro/Psych     Bipolar Disorder   negative neurological ROS     GI/Hepatic ,,,(+)     substance abuse  alcohol useHas NG Colonic obstruction with diverticular abscess   Endo/Other  negative endocrine ROS    Renal/GU negative Renal ROS     Musculoskeletal   Abdominal   Peds  Hematology Hb 12.0, plt 394k   Anesthesia Other Findings   Reproductive/Obstetrics                              Anesthesia Physical Anesthesia Plan  ASA: 2  Anesthesia Plan: General   Post-op Pain Management: Tylenol  PO (pre-op)*   Induction: Intravenous and Rapid sequence  PONV Risk Score and Plan: 3 and Ondansetron , Dexamethasone  and Treatment may vary due to age or medical condition  Airway Management Planned: Oral ETT  Additional Equipment: None  Intra-op Plan:   Post-operative Plan: Extubation in OR  Informed Consent: I have reviewed the patients History and Physical, chart, labs and discussed the procedure including the risks, benefits and alternatives for the proposed anesthesia with the patient or authorized representative who has indicated his/her understanding and acceptance.     Dental advisory given  Plan Discussed with: CRNA and Surgeon  Anesthesia Plan Comments:          Anesthesia Quick Evaluation  "

## 2024-10-30 NOTE — Transfer of Care (Signed)
 Immediate Anesthesia Transfer of Care Note  Patient: Michelle Levy  Procedure(s) Performed: COLECTOMY, PARTIAL, ROBOT-ASSISTED, LAPAROSCOPIC CYSTOSCOPY WITH INDOCYANINE GREEN  IMAGING (ICG) (Bilateral)  Patient Location: PACU  Anesthesia Type:General  Level of Consciousness: awake, alert , and oriented  Airway & Oxygen Therapy: Patient Spontanous Breathing and Patient connected to face mask oxygen  Post-op Assessment: Report given to RN and Post -op Vital signs reviewed and stable  Post vital signs: Reviewed and stable  Last Vitals:  Vitals Value Taken Time  BP 119/64 10/30/24 11:00  Temp    Pulse 70 10/30/24 11:07  Resp 16 10/30/24 11:08  SpO2 88 % 10/30/24 11:07  Vitals shown include unfiled device data.  Last Pain:  Vitals:   10/30/24 0624  TempSrc: Oral  PainSc:       Patients Stated Pain Goal: 0 (10/30/24 0020)  Complications: No notable events documented.

## 2024-10-30 NOTE — Op Note (Signed)
 10/30/2024  10:52 AM  PATIENT:  Michelle Levy  69 y.o. female  Patient Care Team: Aisha Harvey, MD as PCP - General (Family Medicine)  PRE-OPERATIVE DIAGNOSIS:  SIGMOID DIVERTICULITIS WITH ABSCESS & OBSTRUCTION POSSIBLE COLOVESICAL FISTULA  POST-OPERATIVE DIAGNOSIS:   SIGMOID DIVERTICULITIS WITH ABSCESS & OBSTRUCTION POSSIBLE COLOVESICAL FISTULA  PROCEDURE:   -HARTMANN PROCEDURE (PARTIAL COLECTOMY WITH END COLOSTOMY) -OMENTOPEXY OF BLADDER FISTULA -SEROSAL REPAIR OF CECUM -TRANSVERSUS ABDOMINIS PLANE (TAP) BLOCK - BILATERAL -DIAGNOSTIC LAPAROSCOPY  SURGEON:  Elspeth KYM Schultze, MD  ASSISTANT:  CANDIE Ronnald Nick, PA-S, [facility]  An experienced assistant was required given the standard of surgical care given the complexity of the case.  This assistant was needed for exposure, dissection, suction, tissue approximation, retraction, perception, etc  ANESTHESIA:  General endotracheal intubation anesthesia (GETA) and Local & regional field block at incision(s) for perioperative & postoperative pain control provided with 60mL of bupivicaine 0.25% with epinephrine   Estimated Blood Loss (EBL):   Total I/O In: 500 [IV Piggyback:500] Out: 550 [Urine:100; Blood:450].   (See anesthesia record)  Delay start of Pharmacological VTE agent (>24hrs) due to concerns of significant anemia, surgical blood loss, or risk of bleeding?:  no  DRAINS: (None) and 19 Fr Blake drain the tip resting in the pelvis  SPECIMEN:  Rectosigmoid (stitch distal)  DISPOSITION OF SPECIMEN:  Pathology  COUNTS:  Sponge, needle, & instrument counts CORRECT at the conclusion of the case.      PLAN OF CARE: Admit to inpatient   PATIENT DISPOSITION:  PACU - hemodynamically stable.  INDICATION:    69 year old woman with recurrent episodes of sigmoid diverticulitis.  Recent mission improved antibiotics with return feeling constipated.  Significant colon distention concerning for obstruction.  No relief with  bowel rest and IV antibiotics.  I recommended invasive possible open exploration and probable Resection versus diverting colostomy:  The anatomy & physiology of the digestive tract was discussed.  The pathophysiology was discussed.  Natural history risks without surgery was discussed.   I worked to give an overview of the disease and the frequent need to have multispecialty involvement.  I feel the risks of no intervention will lead to serious problems that outweigh the operative risks; therefore, I recommended a partial colectomy to remove the pathology.  Laparoscopic & open techniques were discussed.   Risks such as bleeding, infection, abscess, leak, reoperation, possible ostomy, hernia, heart attack, death, and other risks were discussed.  I noted a good likelihood this will help address the problem.   Goals of post-operative recovery were discussed as well.  We will work to minimize complications.  Educational materials on the pathology had been given in the office.  Questions were answered.    The patient expressed understanding & wished to proceed with surgery.  OR FINDINGS:   Patient had massively dilated colon especially in the cecum that she had some serosal tears and some mild ischemia but no definite perforation.  Due to the massive distention and poor visualization.  Questionable cecum, surgery completed open.  Small bowel and sigmoid colon adhesions to the bladder.  I suspicious for colovesical fistula by cystoscopy with Dr. Devere with urology.  No major follow-up noted on bladder distention.  Omentopexy done for repair.  Patient has an end descending colostomy resting in the left periumbilical region at the premarked spot.  Fascia is closed with wound VAC and subcutaneous tissues.  CASE DATA: Type of patient?: LDOW CASE (Surgical Hospitalist [facility] Inpatient) Status of Case? URGENT Add On Infection  Present At Time Of Surgery (PATOS)?  ABSCESS   DESCRIPTION:   Informed consent was  confirmed.  The patient underwent general anaesthesia without difficulty.  The patient was positioned appropriately.  VTE prevention in place.  Patient underwent cystoscopy with ICG firefly infiltration of bladder and ureters by urology for identification by Dr. Devere.  Please see separate operative report.  Suspicious for left bladder dome colovesical fistula the patient was clipped, prepped, & draped in a sterile fashion.  Surgical timeout confirmed our plan.  The patient was positioned in reverse Trendelenburg.  Abdominal entry was gained using Varess technique at the subcostal ridge on the anterior abdominal wall.  No elevated EtCO2 noted.  Port placed.  Camera inspection revealed no injury.  Extra ports were carefully placed under direct laparoscopic visualization.  Encountered massively dilated colon especially in the cecum and ascending colon.  I had very little visualization.  Pelvis was inspected.  Cecum had obvious serosal tears with some ischemia.  Do not feel it was safe to continue minimally invasively.  Paracolonic made a midline incision that was gradually extended supraumbilically.  History of decompressed small bowel and a giant dilated cecum and ascending colon with some serosal tears.  Large wound protector placed.  Followed small bowel with a jejunal loop densely adhered to the pelvis.  This was carefully freed off with careful blunt finger fracture and sharp dissection.  Inspection revealed no obvious injury.  Was able to find the dilated descending colon coming to a very thickened sigmoid colon in the rectosigmoid.  I could get my hand down into the right pelvis distal to the area of inflammation and dense adhesions to the bladder and uterus.  I carefully focused on sharp dissection and focused blunt dissection and cautery to mobilize the rectosigmoid colon off the bladder uterus and pelvis.  Took meticulous care to identify and preserve the ureters, pedal vessels, and other  retroperitoneal structures.  Gayleen off the rectosigmoid colon off the bladder and encountered a small abscess with inflammation on the left dome of the bladder that seem consistent with a colovesical fistula.  For the peritoneum on the rectosigmoid colon and elevated the mesentery.  Came into the presacral space and came just off the rectum distal to the inflamed stricture.  Chose an area in the proximal rectum after transecting from the mesorectum carefully.  Dissected prostatic with a green load contour stapler.  This help elevate the inflamed rectosigmoid colon out of the pelvis.  Identified the inferior mesenteric artery and vein pedicles..  Skeletonized and after confirming they were away from the retroperitoneal structures legs were ligated and transected using a vessel sealer to good result.  Mobilized descending colon in a lateral medial fashion as well to get adequate mobilization.  Chose an area in the descending/sigmoid junction that was soft and transected with a stapler.  I created a defect for the colostomy that was premarked in the left periumbilical region.  Created a 2 cm diameter skin defect and excised a cylinder of fat.  Came through the anterior rectus fascia transversely.  Split the rectus muscle vertically and came through the posterior rectus fascia transversely.  Dilated to 2 fingers and placed a wound protector.  During the descending colon colostomy through the smaller wound retractor out the premarked colostomy wound.  Given the massive distention and create a colotomy had a long quarter of this and and was able to use a pull tip sucker to decompress a large volume of liquid stool leaving  more gas.  That help decompress the colon especially the cecum to much more natural and viable status.  Did confirm that the cecum had no perforation but it had clear serosal tears.  I decided to repair those with interrupted silk sutures longitudinally to good result.  Inspection saw no injury.  Ran  the small bowel.  Subtle areas of density formation patient referred to injury there in the bowel viable.  Did copious irrigation of several liters serial aliquots of sterile solution to good clear resolved.  Confirmed that the NG tube is actually not the stomach esthesia carefully advance it such that the tip was in the anturm.  Then had the circulator infiltrated the bladder with methylene blue  solution to help distended.  Distention of 300 mL.  There was some kinking activity at the left dome of the bladder where the uterus was trying to walled off.  Cannot prove any definite opening or persistent fistula.  Given the inflammation and phlegmon I carefully wash debrided.  I then laid omentum over this area fistula and sutured it down with interrupted Vicryl sutures for an omentopexy to help protect the area.  Drain placed down into the pelvis deeply and laying over the bladder as well.  Secured with Prolene suture.  I am irrigated and inspected improved no injury.  Wound protectors removed.  The midline fascia was closed using #1 PDS for all fascia.  Because of the most abscess and inflammation we left the subcutaneous tissues open and used a wound VAC gray sponge into the subcutaneous tissues.  Good seal.  We then matured the end colostomy using 2-0 Vicryl interrupted sutures to have a 2 cm having to improve the ileostomy.  Some mild ischemia noted to have moderate to contortion.  Ostomy appliance applied.  Patient extubated sent to recovery room.  Did not encounter any hemodynamic issues or bleeding issues that should be able to go back to the floor.  I discussed operative findings, updated the patient's status, discussed probable steps to recovery, and gave postoperative recommendations to the patient's daughter, Tinnie Haddock.  Recommendations were made.  Questions were answered.  She expressed understanding & appreciation.  Elspeth KYM Schultze, M.D., F.A.C.S. Gastrointestinal and Minimally Invasive  Surgery [facility] [address], [address] [place], [place] [address] [phone] Main / Paging

## 2024-10-30 NOTE — Progress Notes (Signed)
 " Progress Note   Patient: Michelle Levy FMW:[id number] DOB: 06/30/1956 DOA: 10/28/2024     2 DOS: the patient was seen and examined on 10/30/2024   Brief hospital course: Michelle Levy is a 69 y.o. female with history of bipolar disorder who was recently admitted last month from 10/02/2024 through 10/05/2024 for diverticulitis with abscess managed conservatively with antibiotics states that since Wednesday about 4 days ago patient has been having abdominal discomfort mostly in the left lower quadrant.  In the ER CT showed features concerning for large bowel obstruction and colovesical fistula.  Laboratory findings showed sodium of 123, white count 12.6, abnormal UA.  General surgery consulted.  Patient is started on empiric antibiotics, NG tube decompression admitted to [facility] service for further management evaluation.  Assessment and Plan: Large bowel obstruction Possible colovesical fistula- Sigmoid diverticulitis with abscess causing obstruction S/p Hartmann procedure, partial colectomy with end colostomy, omentopexy of bladder fistula, serosal repair of cecum, placement of pelvic drain. Continue Zosyn . Follow cultures. Continue pain control. N.p.o., continue NG to low wall suction. General surgery follow-up and recommendations appreciated. Further management per surgery team. Will follow.  Hyponatremia- Chronic, baseline sodium 127-130. Continue gentle IV fluids. Trend sodium.  Hypokalemia- Potassium improved with supplementation. Continue to recheck electrolytes and replete accordingly.  Hypermagnesemia: Magnesium  was 7.0 improved to 2.0 today.  Hold home mag citrate supplementation.  Bipolar disorder- She  takes Depakote  at home.This is held due to her being NPO. Resume home meds when she is able to tolerate p.o. after surgery.     Out of bed to chair. Incentive spirometry. Nursing supportive care. Fall, aspiration precautions. Diet:  Diet Orders (From admission, onward)      Start     Ordered   10/28/24 2133  Diet NPO time specified  Diet effective now        10/28/24 2133           DVT prophylaxis: SCDs Start: 10/28/24 2132  Level of care: Telemetry   Code Status: Full Code  Subjective: Patient is seen and examined today after she came from the OR.  She is lying comfortably.  NG tube with dark fluid noted.  No abdominal discomfort, nausea.  Physical Exam: Vitals:   10/30/24 1145 10/30/24 1200 10/30/24 1215 10/30/24 1244  BP: (!) 110/52 (!) 108/53 (!) 115/52 (!) 110/57  Pulse: 68 72 67 75  Resp: 14 14 13 16   Temp:    (!) 97.5 F (36.4 C)  TempSrc:    Oral  SpO2: 98% 99% 98% 99%  Weight:      Height:        General - Elderly Caucasian thin built female, no apparent distress HEENT - PERRLA, EOMI, atraumatic head,   NG tube intact. Lung - Clear, basal rales, rhonchi, no wheezes. Heart - S1, S2 heard, no murmurs, rubs, trace pedal edema. Abdomen - Soft, mild diffuse tender, colostomy, drain noted. Neuro - Alert, awake and oriented x 3, non focal exam. Skin - Warm and dry.  Data Reviewed:      Latest Ref Rng & Units 10/30/2024    5:57 AM 10/29/2024    5:31 AM 10/28/2024   12:26 PM  CBC  WBC 4.0 - 10.5 K/uL 13.4  10.9  12.6   Hemoglobin 12.0 - 15.0 g/dL 86.8  87.9  86.2   Hematocrit 36.0 - 46.0 % 38.0  33.5  38.1   Platelets 150 - 400 K/uL 491  394  460  Latest Ref Rng & Units 10/30/2024    5:57 AM 10/29/2024    5:04 PM 10/29/2024    9:47 AM  BMP  Glucose 70 - 99 mg/dL 74  72  85   BUN 8 - 23 mg/dL 9  7  6    Creatinine 0.44 - 1.00 mg/dL 9.23  9.40  9.35   Sodium 135 - 145 mmol/L 127  126  125   Potassium 3.5 - 5.1 mmol/L 3.5  3.2  2.8   Chloride 98 - 111 mmol/L 91  92  90   CO2 22 - 32 mmol/L 19  21  22    Calcium  8.9 - 10.3 mg/dL 8.6  8.2  8.4    DG Abd Portable 1 View Result Date: 10/28/2024 EXAM: 1 VIEW XRAY OF THE ABDOMEN 10/28/2024 07:17:00 PM COMPARISON: CT abdomen and pelvis 11/26 CLINICAL HISTORY: post NG tube  insertion FINDINGS: LINES, TUBES AND DEVICES: Enteric tube in place with tip and side port below the diaphragm overlying the expected region of the stomach. BOWEL: Mild gaseous distention of small bowel and colon in the included abdomen. Hiatal hernia. SOFT TISSUES: No abnormal calcifications. BONES: No acute fracture. IMPRESSION: 1. Enteric tube in place with tip and side port below the diaphragm overlying the expected region of the stomach. 2. Mild gaseous distention of small bowel and colon in the included abdomen. Electronically signed by: Morgane Naveau MD MD 10/28/2024 07:20 PM EST RP Workstation: [id number]   CT ABDOMEN PELVIS W CONTRAST Result Date: 10/28/2024 EXAM: CT ABDOMEN AND PELVIS WITH CONTRAST 10/28/2024 06:17:07 PM TECHNIQUE: CT of the abdomen and pelvis was performed with the administration of intravenous contrast. Multiplanar reformatted images are provided for review. Automated exposure control, iterative reconstruction, and/or weight-based adjustment of the mA/kV was utilized to reduce the radiation dose to as low as reasonably achievable. COMPARISON: 10/17/2024 and 10/01/2024 CLINICAL HISTORY: Unspecified abdominal pain, nausea, diarrhea, complicated diverticulitis with multiple intraabdominal abscesses. FINDINGS: LOWER CHEST: Right basilar discoid atelectasis. Small hiatal hernia. LIVER: The liver is unremarkable. GALLBLADDER AND BILE DUCTS: Gallbladder is unremarkable. No biliary ductal dilatation. SPLEEN: No acute abnormality. PANCREAS: No acute abnormality. ADRENAL GLANDS: No acute abnormality. KIDNEYS, URETERS AND BLADDER: No stones in the kidneys or ureters. No hydronephrosis. No perinephric or periureteral stranding. There has developed circumferential bladder wall thickening and prominent mucosal hyperemia suggesting a developing infectious or inflammatory cystitis. The bladder, however, is decompressed. There is suggestion of a possible indirect colovesicular fistula by way of the  abscess cavity best appreciated on coronal image 67 - 70, series 7. Correlation with urine culture may be helpful for confirmation. GI AND BOWEL: Stomach and small bowel were unremarkable. Appendix normal. The previously identified intra-abdominal abscesses continued to improve and now have nearly resolved with the residual abscess cavities measuring 1.7 x 2.5 cm, containing no significant drainable fluid (image 75, series 2), and 12.5 x 2.4 cm containing a punctate focus of gas (image 73, series 2). However, a large bowel obstruction has developed with gas and fluid distention of the colon proximal to the mid sigmoid colon where there is irregular transition to a decompressed sigmoid colon (image 72, series 2). While this may simply relate to edema or post inflammatory scarring, an underlying cicatricial mass is difficult to exclude on this examination. The cecum is dilated up to 9 cm in diameter. PERITONEUM AND RETROPERITONEUM: Interval development of mild ascites. No free intraperitoneal gas or free air. VASCULATURE: Aorta is normal in caliber. LYMPH NODES: No lymphadenopathy. REPRODUCTIVE ORGANS:  Calcified inverted fibroid within the uterus. Pelvic organs are otherwise unremarkable. BONES AND SOFT TISSUES: Osseous structures are age appropriate. No acute bone abnormality. No lytic or blastic bone lesion. No focal soft tissue abnormality. IMPRESSION: 1. Large bowel obstruction with gas and fluid distention of the colon proximal to the mid sigmoid colon, where there is irregular transition to a decompressed sigmoid colon, with cecal dilation up to 9 cm in diameter; an underlying cicatricial mass is difficult to exclude. Correlation with endoscopy may be helpful for further evaluation. 2. Circumferential bladder wall thickening and prominent mucosal hyperemia suggesting developing infectious or inflammatory cystitis, with suggestion of a possible indirect colovesicular fistula by way of the abscess cavity;  urinalysis and urine culture may help confirm. 3. Continued resolution of the intra-abdominal abscesses with Residual intra-abdominal abscess cavities measuring 1.7 x 2.5 cm and 12.5 x 2.4 cm, the latter containing a punctate focus of gas. No residual drainable fluid component identified. 4. Interval development of mild ascites. 5. Small hiatal hernia. Electronically signed by: Dorethia Molt MD MD 10/28/2024 06:31 PM EST RP Workstation: [id number]    Family Communication: Discussed with patient, she understand and agree. All questions answered.  Disposition: Status is: Inpatient Remains inpatient appropriate because: S/p [facility] surgery, IV antibiotics, NG tube, NPO, surgery team follow-up  Planned Discharge Destination: Home with Home Health     Time spent: 46 minutes  Author: Concepcion Riser, MD 10/30/2024 3:21 PM Secure chat 7am to 7pm For on call review [email or web address].    "

## 2024-10-30 NOTE — Anesthesia Procedure Notes (Signed)
 Procedure Name: Intubation Date/Time: 10/30/2024 8:02 AM  Performed by: Leonce Athens, MDPre-anesthesia Checklist: Patient identified, Emergency Drugs available, Suction available, Patient being monitored and Timeout performed Patient Re-evaluated:Patient Re-evaluated prior to induction Oxygen Delivery Method: Circle system utilized Preoxygenation: Pre-oxygenation with 100% oxygen Induction Type: IV induction and Rapid sequence Laryngoscope Size: Glidescope and 3 (unable to visualize glottis with Miller 2, Mac 3, epiglottis only) Grade View: Grade I Tube size: 7.0 mm Airway Equipment and Method: Video-laryngoscopy Placement Confirmation: ETT inserted through vocal cords under direct vision, positive ETCO2 and breath sounds checked- equal and bilateral Secured at: 22 cm Tube secured with: Tape Dental Injury: Teeth and Oropharynx as per pre-operative assessment and Injury to lip  Difficulty Due To: Difficulty was anticipated, Difficult Airway- due to anterior larynx and Difficult Airway- due to limited oral opening Comments: Easy VideoGlide intubation, difficult DL due to small mouth, short chin, protruding incisors

## 2024-10-30 NOTE — Op Note (Signed)
 Operative Note  Preoperative diagnosis:  1.  Diverticulitis with colonic abscess  Postoperative diagnosis: 1.  Diverticulitis with colonic abscess 2.  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  Cystoscopy with bilateral ureteral firefly injections  Surgeon: Lonni Han, MD  Assistants:  None  Anesthesia:  General  Complications:  None  EBL: Less than 5 mL  Specimens: 1.  None  Drains/Catheters: 1.  None  Intraoperative findings:   Edematous bladder mucosa involving the left bladder dome excreting mucus-like material concerning for colovesical fistula No other intravesical or urethral abnormalities were seen  Indication:  Michelle Levy is a 69 y.o. female with diverticulitis with colonic abscess who is undergoing diverting colostomy with Dr. Sheldon.  Urology has been consulted to inject ureteral firefly to aid in ureteral identification intraoperatively.  She has been consented for the above procedures, voiced understanding and wishes to proceed.   Description of procedure: After informed consent was obtained, the patient was brought to the operating room and general endotracheal anesthesia was administered. The patient was then placed in the dorsolithotomy position and prepped and draped in usual sterile fashion. A timeout was performed. A 21 French rigid cystoscope was then inserted into the urethral meatus and advanced into the bladder under direct vision. A complete bladder survey revealed the findings listed above.    A 6 French open-ended catheter was then used to intubate the right ureteral orifice and a total of 7.5 mL of firefly solution diluted with 10 mL of saline was injected into the right collecting system. A similar maneuver was then carried out on the left with the same volume and concentration of firefly. The rigid cystoscope was then removed under direct vision. A 16 French Foley catheter was then inserted and placed  to gravity drainage. The patient tolerated the procedure well. Dr. Sheldon then proceeded with their portion of the case  Plan:  Foley removal is at the discretion of the primary team.

## 2024-10-30 NOTE — Progress Notes (Signed)
 Patient prepare surgical and sent to pre-op by transport in a stable condition per charge .

## 2024-10-30 NOTE — Anesthesia Postprocedure Evaluation (Signed)
"   Anesthesia Post Note  Patient: Michelle Levy  Procedure(s) Performed: DIAGNOSTIC LAPAROSCOPY, OMENTOPEXY OF BLADDER FISTULA, AND SEROSAL REPAIR OF CECUM HARTMANN PROCEDURE CYSTOSCOPY WITH INDOCYANINE GREEN  IMAGING (ICG) (Bilateral)     Patient location during evaluation: PACU Anesthesia Type: General Level of consciousness: awake and alert, oriented and patient cooperative Pain management: pain level controlled Vital Signs Assessment: post-procedure vital signs reviewed and stable Respiratory status: spontaneous breathing, nonlabored ventilation, respiratory function stable and patient connected to nasal cannula oxygen Cardiovascular status: blood pressure returned to baseline and stable Postop Assessment: no apparent nausea or vomiting Anesthetic complications: no   No notable events documented.  Last Vitals:  Vitals:   10/30/24 1215 10/30/24 1244  BP: (!) 115/52 (!) 110/57  Pulse: 67 75  Resp: 13 16  Temp:  (!) 36.4 C  SpO2: 98% 99%    Last Pain:  Vitals:   10/30/24 1244  TempSrc: Oral  PainSc:                  Kolbie Lepkowski,E. Keondria Siever      "

## 2024-10-31 ENCOUNTER — Encounter (HOSPITAL_COMMUNITY): Payer: Self-pay | Admitting: Surgery

## 2024-10-31 DIAGNOSIS — K56609 Unspecified intestinal obstruction, unspecified as to partial versus complete obstruction: Secondary | ICD-10-CM | POA: Diagnosis not present

## 2024-10-31 DIAGNOSIS — E871 Hypo-osmolality and hyponatremia: Secondary | ICD-10-CM | POA: Diagnosis not present

## 2024-10-31 DIAGNOSIS — F3163 Bipolar disorder, current episode mixed, severe, without psychotic features: Secondary | ICD-10-CM | POA: Diagnosis not present

## 2024-10-31 LAB — CBC
HCT: 29.8 % — ABNORMAL LOW (ref 36.0–46.0)
Hemoglobin: 10.3 g/dL — ABNORMAL LOW (ref 12.0–15.0)
MCH: 30.7 pg (ref 26.0–34.0)
MCHC: 34.6 g/dL (ref 30.0–36.0)
MCV: 89 fL (ref 80.0–100.0)
Platelets: 437 K/uL — ABNORMAL HIGH (ref 150–400)
RBC: 3.35 MIL/uL — ABNORMAL LOW (ref 3.87–5.11)
RDW: 14.1 % (ref 11.5–15.5)
WBC: 14.8 K/uL — ABNORMAL HIGH (ref 4.0–10.5)
nRBC: 0 % (ref 0.0–0.2)

## 2024-10-31 LAB — BASIC METABOLIC PANEL WITH GFR
Anion gap: 16 — ABNORMAL HIGH (ref 5–15)
Anion gap: 11 (ref 5–15)
BUN: 13 mg/dL (ref 8–23)
BUN: 19 mg/dL (ref 8–23)
CO2: 21 mmol/L — ABNORMAL LOW (ref 22–32)
CO2: 24 mmol/L (ref 22–32)
Calcium: 8.3 mg/dL — ABNORMAL LOW (ref 8.9–10.3)
Calcium: 8.2 mg/dL — ABNORMAL LOW (ref 8.9–10.3)
Chloride: 95 mmol/L — ABNORMAL LOW (ref 98–111)
Chloride: 96 mmol/L — ABNORMAL LOW (ref 98–111)
Creatinine, Ser: 0.88 mg/dL (ref 0.44–1.00)
Creatinine, Ser: 1.08 mg/dL — ABNORMAL HIGH (ref 0.44–1.00)
GFR, Estimated: >60 mL/min
GFR, Estimated: 55 mL/min — ABNORMAL LOW
Glucose, Bld: 114 mg/dL — ABNORMAL HIGH (ref 70–99)
Glucose, Bld: 234 mg/dL — ABNORMAL HIGH (ref 70–99)
Potassium: 4.1 mmol/L (ref 3.5–5.1)
Potassium: 4 mmol/L (ref 3.5–5.1)
Sodium: 131 mmol/L — ABNORMAL LOW (ref 135–145)
Sodium: 131 mmol/L — ABNORMAL LOW (ref 135–145)

## 2024-10-31 LAB — PREALBUMIN: Prealbumin: 6 mg/dL — ABNORMAL LOW (ref 18–38)

## 2024-10-31 LAB — MAGNESIUM: Magnesium: 2.1 mg/dL (ref 1.7–2.4)

## 2024-10-31 MED ORDER — CHLORHEXIDINE GLUCONATE CLOTH 2 % EX PADS
6.0000 | MEDICATED_PAD | Freq: Every day | CUTANEOUS | Status: DC
  Administered 2024-10-31 – 2024-11-04 (×5): 6 via TOPICAL

## 2024-10-31 NOTE — Consult Note (Addendum)
 WOC Nurse Wound Consult Note: Pt had surgery yesterday and a Vac was applied to the midline abd full thickness wound. Cont suction on at with good seal.  Supplies ordered to the bedside and WOC team will perform dressing change tomorrow. Ostomy and Vac dressing are in close proximity and both will need to be changed each time.   WOC Nurse ostomy follow up Pt had colostomy surgery performed yesterday. Current pouch is intact with good seal and mod amt brown liquid stool.  Stoma is red and viable when visualized through the pouch.  Appears that she will need to use a convex pouching system.  WOC team will perform pouch change and teaching session tomorrow with Vac change; Pt denies need for any friends or family to be present. 5 sets of supplies ordered to the bedside for staff nurses' use: barrier rings, Gerlean # [phone] and convex pouches, Gerlean # [phone] Educational materials left at the bedside.   Placed on Secure Start: NOT YET Thank-you,  Stephane Fought MSN, RN, CWOCN, CWCN-AP, CNS Contact Mon-Fri 0700-1500: [phone]

## 2024-10-31 NOTE — Progress Notes (Signed)
 10/31/2024  Michelle Levy [id number] September 05, 1956  CARE TEAM: PCP: Aisha Harvey, MD  Outpatient Care Team: Patient Care Team: Aisha Harvey, MD as PCP - General (Family Medicine)  Inpatient Treatment Team: Treatment Team:  Darci Pore, MD Ccs, Md, MD Sherree Stephane KIDD, RN Bobbette Mattocks, MD Claudene Shela MATSU, NT Purgason, Vina NOVAK, RRT Verdon Blane MATSU, RN Neysa Ronal Mallie FORBES, OT Benton, Benjiman DEL, PT Duwayne Angeline NOVAK, RN Osie Iantha SQUIBB, [facility]   Problem List:   Principal Problem:   Bowel obstruction (HCC) Active Problems:   Bipolar disorder, curr episode mixed, severe, w/o psychotic features (HCC)   Abscess of sigmoid colon due to diverticulitis   Hyponatremia   Hypokalemia   Hypermagnesemia   Colovesical fistula   10/30/2024  Procedures: CYSTOSCOPY WITH INDOCYANINE GREEN  IMAGING (ICG) DIAGNOSTIC LAPAROSCOPY, OMENTOPEXY OF BLADDER FISTULA, AND SEROSAL REPAIR OF CECUM HARTMANN PROCEDURE    Assessment [facility] Stay = 3 days) 1 Day Post-Op    Abdomen is non distended, tender at incision sites only.  Patient needs to ambulate. Flatus is present in ostomy pack with stool.  JP drainage is serosanguinous per Bruno RN Hemoglobin decreased from 13.1 yesterday to 10.3 today.     Plan:  - Continue to monitor hemoglobin  - Monitor electrolytes & replace as needed  Keep K>4, Mg>2, Phos>3  -VTE prophylaxis- SCDs.  Anticoagulation prophyllaxis SQ as appropriate  - Mobilize as tolerated to help recovery.  Enlist therapies in moderate/high risk patients as appropriate   -Disposition:     I reviewed last 24 h vitals and pain scores, last 48 h intake and output, last 24 h labs and trends, and last 24 h imaging results.  I have reviewed this patient's available data, including medical history, events of note, test results, etc as part of my evaluation.   A significant portion of that time was spent in counseling. Care during the described time interval was  provided by me.  This care required moderate level of medical decision making.  10/31/2024    Subjective: (Chief complaint)  Patient stated she is feeling relatively well.   Pain is currently a 0/10.  She has not ambulated following her surgery  Denies nausea, vomiting, or hiccupping.    Objective:  Vital signs:  Vitals:   10/30/24 1215 10/30/24 1244 10/30/24 1931 10/31/24 0546  BP: (!) 115/52 (!) 110/57 109/60 (!) 112/59  Pulse: 67 75 90 (!) 108  Resp: 13 16 15 15   Temp:  (!) 97.5 F (36.4 C) 98.4 F (36.9 C) 97.9 F (36.6 C)  TempSrc:  Oral Oral   SpO2: 98% 99%    Weight:      Height:        Last BM Date : 10/29/24  Intake/Output   Yesterday:  01/13 0701 - 01/14 0700 In: 1168.2 [I.V.:650; IV Piggyback:518.2] Out: 1440 [Urine:500; Emesis/NG output:300; Drains:190; Blood:450] This shift:  Total I/O In: -  Out: 120 [Drains:120]  Bowel function:  Flatus: YES  BM:  Yes - Ostomy   Drain: Serosanguinous   Physical Exam:  General: Pt awake/alert in no acute distress Eyes: PERRL, normal EOM.  Sclera clear.  No icterus Neuro: CN II-XII intact w/o focal sensory/motor deficits. Lymph: No head/neck/groin lymphadenopathy Psych:  No delerium/psychosis/paranoia.  Oriented x 4 HENT: Normocephalic, Mucus membranes moist.  No thrush Neck: Supple, No tracheal deviation.  No obvious thyromegaly Chest: No pain to chest wall compression.  Good respiratory excursion.  No audible wheezing CV:  Pulses intact.  Regular rhythm.  No major extremity edema MS: Normal AROM mjr joints.  No obvious deformity  Abdomen: Soft.  Nondistended.  Mildly tender at incisions only.  No evidence of peritonitis.  No incarcerated hernias.  Ext:   No deformity.  No mjr edema.  No cyanosis Skin: No petechiae / purpurea.  No major sores.  Warm and dry    Results:   Cultures: Recent Results (from the past 720 hours)  Blood culture (routine x 2)     Status: None (Preliminary result)    Collection Time: 10/28/24  4:01 PM   Specimen: BLOOD  Result Value Ref Range Status   Specimen Description   Final    BLOOD BLOOD RIGHT HAND Performed at [facility], [address] [place], [place] [address]    Special Requests   Final    BOTTLES DRAWN AEROBIC AND ANAEROBIC Blood Culture results may not be optimal due to an inadequate volume of blood received in culture bottles Performed at [facility], [address], [place], [place] [address]    Culture   Final    NO GROWTH 3 DAYS Performed at [facility], [address] [place], [place] [address]    Report Status PENDING  Incomplete  Blood culture (routine x 2)     Status: None (Preliminary result)   Collection Time: 10/29/24  5:31 AM   Specimen: BLOOD LEFT ARM  Result Value Ref Range Status   Specimen Description   Final    BLOOD LEFT ARM Performed at [facility], [address] [place], [place] [address]    Special Requests   Final    BOTTLES DRAWN AEROBIC AND ANAEROBIC Blood Culture adequate volume Performed at [facility], [address], [place], [place] [address]    Culture   Final    NO GROWTH 2 DAYS Performed at [facility], [address], [place], [place] [address]    Report Status PENDING  Incomplete  Urine Culture     Status: None   Collection Time: 10/29/24  9:41 AM   Specimen: Urine, Random  Result Value Ref Range Status   Specimen Description   Final    URINE, RANDOM Performed at [facility], [address] [place], [place] [address]    Special Requests   Final    NONE Reflexed from [id number] Performed at [facility], [address], [place], [place] [address]    Culture   Final    NO GROWTH Performed at [facility], [address], [place], [place] [address]    Report Status 10/30/2024 FINAL  Final    Labs: Results for orders placed or performed during the hospital  encounter of 10/28/24 (from the past 48 hours)  Basic metabolic panel with GFR     Status: Abnormal   Collection Time: 10/29/24  5:04 PM  Result Value Ref Range   Sodium 126 (L) 135 - 145 mmol/L   Potassium 3.2 (L) 3.5 - 5.1 mmol/L   Chloride 92 (L) 98 - 111 mmol/L   CO2 21 (L) 22 - 32 mmol/L   Glucose, Bld 72 70 - 99 mg/dL    Comment: Glucose reference range applies only to samples taken after fasting for at least 8 hours.   BUN 7 (L) 8 - 23 mg/dL   Creatinine, Ser 9.40 0.44 - 1.00 mg/dL   Calcium  8.2 (L) 8.9 - 10.3 mg/dL  GFR, Estimated >60 >60 mL/min    Comment: (NOTE) Calculated using the CKD-EPI Creatinine Equation (2021)    Anion gap 13 5 - 15    Comment: Performed at [facility], [address], [place], [place] [address]  Magnesium      Status: None   Collection Time: 10/29/24  5:04 PM  Result Value Ref Range   Magnesium  1.9 1.7 - 2.4 mg/dL    Comment: Performed at [facility], [address], [place], [place] [address]  Hemoglobin A1c     Status: None   Collection Time: 10/30/24  5:57 AM  Result Value Ref Range   Hgb A1c MFr Bld 5.4 4.8 - 5.6 %    Comment: (NOTE) Diagnosis of Diabetes The following HbA1c ranges recommended by the American Diabetes Association (ADA) may be used as an aid in the diagnosis of diabetes mellitus.  Hemoglobin             Suggested A1C NGSP%              Diagnosis  <5.7                   Non Diabetic  5.7-6.4                Pre-Diabetic  >6.4                   Diabetic  <7.0                   Glycemic control for                       adults with diabetes.     Mean Plasma Glucose 108.28 mg/dL    Comment: Performed at [facility], [address], [place], [place] [address]  Basic metabolic panel with GFR     Status: Abnormal   Collection Time: 10/30/24  5:57 AM  Result Value Ref Range   Sodium 127 (L) 135 - 145 mmol/L   Potassium 3.5 3.5 - 5.1 mmol/L   Chloride 91 (L) 98 - 111  mmol/L   CO2 19 (L) 22 - 32 mmol/L   Glucose, Bld 74 70 - 99 mg/dL    Comment: Glucose reference range applies only to samples taken after fasting for at least 8 hours.   BUN 9 8 - 23 mg/dL   Creatinine, Ser 9.23 0.44 - 1.00 mg/dL   Calcium  8.6 (L) 8.9 - 10.3 mg/dL   GFR, Estimated >39 >39 mL/min    Comment: (NOTE) Calculated using the CKD-EPI Creatinine Equation (2021)    Anion gap 18 (H) 5 - 15    Comment: Performed at [facility] [address] [place], [place] [address]  Magnesium      Status: None   Collection Time: 10/30/24  5:57 AM  Result Value Ref Range   Magnesium  2.0 1.7 - 2.4 mg/dL    Comment: Performed at [facility], [address] [place], [place] [address]  CBC     Status: Abnormal   Collection Time: 10/30/24  5:57 AM  Result Value Ref Range   WBC 13.4 (H) 4.0 - 10.5 K/uL   RBC 4.31 3.87 - 5.11 MIL/uL   Hemoglobin 13.1 12.0 - 15.0 g/dL   HCT 61.9 63.9 - 53.9 %   MCV 88.2 80.0 - 100.0 fL   MCH 30.4 26.0 - 34.0 pg   MCHC 34.5  30.0 - 36.0 g/dL   RDW 86.2 88.4 - 84.4 %   Platelets 491 (H) 150 - 400 K/uL   nRBC 0.0 0.0 - 0.2 %    Comment: Performed at [facility], [address] [place], [place] [address]  Type and screen [facility]     Status: None   Collection Time: 10/30/24  6:43 AM  Result Value Ref Range   ABO/RH(D) A POS    Antibody Screen NEG    Sample Expiration      11/02/2024,2359 Performed at [facility], [address], [place], [place] [address]   ABO/Rh     Status: None   Collection Time: 10/30/24  6:45 AM  Result Value Ref Range   ABO/RH(D)      A POS Performed at [facility], [address] [place], [place] [address]   Basic metabolic panel with GFR     Status: Abnormal   Collection Time: 10/30/24  7:18 PM  Result Value Ref Range   Sodium 128 (L) 135 - 145 mmol/L   Potassium 3.8 3.5 - 5.1 mmol/L   Chloride 94 (L) 98  - 111 mmol/L   CO2 18 (L) 22 - 32 mmol/L   Glucose, Bld 118 (H) 70 - 99 mg/dL    Comment: Glucose reference range applies only to samples taken after fasting for at least 8 hours.   BUN 10 8 - 23 mg/dL   Creatinine, Ser 9.22 0.44 - 1.00 mg/dL   Calcium  7.8 (L) 8.9 - 10.3 mg/dL   GFR, Estimated >39 >39 mL/min    Comment: (NOTE) Calculated using the CKD-EPI Creatinine Equation (2021)    Anion gap 16 (H) 5 - 15    Comment: Performed at [facility], [address] [place], [place] [address]  Magnesium      Status: None   Collection Time: 10/30/24  7:18 PM  Result Value Ref Range   Magnesium  1.9 1.7 - 2.4 mg/dL    Comment: Performed at [facility], [address], [place], [place] [address]  Basic metabolic panel with GFR     Status: Abnormal   Collection Time: 10/31/24  6:02 AM  Result Value Ref Range   Sodium 131 (L) 135 - 145 mmol/L   Potassium 4.1 3.5 - 5.1 mmol/L   Chloride 95 (L) 98 - 111 mmol/L   CO2 21 (L) 22 - 32 mmol/L   Glucose, Bld 114 (H) 70 - 99 mg/dL    Comment: Glucose reference range applies only to samples taken after fasting for at least 8 hours.   BUN 13 8 - 23 mg/dL   Creatinine, Ser 9.11 0.44 - 1.00 mg/dL   Calcium  8.3 (L) 8.9 - 10.3 mg/dL   GFR, Estimated >39 >39 mL/min    Comment: (NOTE) Calculated using the CKD-EPI Creatinine Equation (2021)    Anion gap 16 (H) 5 - 15    Comment: Performed at [facility], [address] [place], [place] [address]  Magnesium      Status: None   Collection Time: 10/31/24  6:02 AM  Result Value Ref Range   Magnesium  2.1 1.7 - 2.4 mg/dL    Comment: Performed at [facility], [address], [place], [place] [address]  CBC     Status: Abnormal   Collection Time: 10/31/24  6:02 AM  Result Value Ref Range   WBC 14.8 (H) 4.0 - 10.5 K/uL  RBC 3.35 (L) 3.87 - 5.11 MIL/uL   Hemoglobin 10.3 (L) 12.0 - 15.0 g/dL   HCT 70.1 (L) 63.9 - 53.9 %   MCV  89.0 80.0 - 100.0 fL   MCH 30.7 26.0 - 34.0 pg   MCHC 34.6 30.0 - 36.0 g/dL   RDW 85.8 88.4 - 84.4 %   Platelets 437 (H) 150 - 400 K/uL   nRBC 0.0 0.0 - 0.2 %    Comment: Performed at [facility], [address], [place], [place] [address]  Prealbumin     Status: Abnormal   Collection Time: 10/31/24  6:02 AM  Result Value Ref Range   Prealbumin 6 (L) 18 - 38 mg/dL    Comment: Performed at [facility], [address], [place], [place] [address]    Imaging / Studies: No results found.  Medications / Allergies: per chart  Antibiotics: Anti-infectives (From admission, onward)    Start     Dose/Rate Route Frequency Ordered Stop   10/30/24 1600  piperacillin -tazobactam (ZOSYN ) IVPB 3.375 g        3.375 g 12.5 mL/hr over 240 Minutes Intravenous Every 8 hours 10/30/24 1230 11/04/24 1559   10/30/24 0600  cefoTEtan  (CEFOTAN ) 2 g in sodium chloride  0.9 % 100 mL IVPB        2 g 200 mL/hr over 30 Minutes Intravenous On call to O.R. 10/29/24 1219 10/30/24 0817   10/29/24 1400  neomycin  (MYCIFRADIN ) tablet 1,000 mg       Placed in And Linked Group   1,000 mg Per Tube 3 times per day 10/29/24 1219 10/29/24 2211   10/29/24 1400  metroNIDAZOLE  (FLAGYL ) tablet 1,000 mg       Placed in And Linked Group   1,000 mg Per Tube 3 times per day 10/29/24 1219 10/29/24 2211   10/29/24 0000  piperacillin -tazobactam (ZOSYN ) IVPB 3.375 g  Status:  Discontinued        3.375 g 12.5 mL/hr over 240 Minutes Intravenous Every 8 hours 10/28/24 2146 10/30/24 1230   10/28/24 1630  piperacillin -tazobactam (ZOSYN ) IVPB 3.375 g        3.375 g 100 mL/hr over 30 Minutes Intravenous  Once 10/28/24 1624 10/28/24 1753   10/28/24 1600  cefTRIAXone  (ROCEPHIN ) 1 g in sodium chloride  0.9 % 100 mL IVPB  Status:  Discontinued        1 g 200 mL/hr over 30 Minutes Intravenous  Once 10/28/24 1550 10/28/24 1611         Note: Portions of this report may have been transcribed using voice  recognition software. Every effort was made to ensure accuracy; however, inadvertent computerized transcription errors may be present.   Any transcriptional errors that result from this process are unintentional.   Lauraine Ronnald Nick PA-S [facility]  10/31/2024  11:22 AM

## 2024-10-31 NOTE — Plan of Care (Signed)
" °  Problem: Education: Goal: Knowledge of General Education information will improve Description: Including pain rating scale, medication(s)/side effects and non-pharmacologic comfort measures Outcome: Progressing   Problem: Health Behavior/Discharge Planning: Goal: Ability to manage health-related needs will improve Outcome: Progressing   Problem: Clinical Measurements: Goal: Ability to maintain clinical measurements within normal limits will improve Outcome: Progressing Goal: Will remain free from infection Outcome: Progressing Goal: Diagnostic test results will improve Outcome: Progressing Goal: Respiratory complications will improve Outcome: Progressing Goal: Cardiovascular complication will be avoided Outcome: Progressing   Problem: Activity: Goal: Risk for activity intolerance will decrease Outcome: Progressing   Problem: Nutrition: Goal: Adequate nutrition will be maintained Outcome: Progressing   Problem: Coping: Goal: Level of anxiety will decrease Outcome: Progressing   Problem: Elimination: Goal: Will not experience complications related to bowel motility Outcome: Progressing Goal: Will not experience complications related to urinary retention Outcome: Progressing   Problem: Pain Managment: Goal: General experience of comfort will improve and/or be controlled Outcome: Progressing   Problem: Safety: Goal: Ability to remain free from injury will improve Outcome: Progressing   Problem: Skin Integrity: Goal: Risk for impaired skin integrity will decrease Outcome: Progressing   Problem: Education: Goal: Understanding of discharge needs will improve Outcome: Progressing Goal: Verbalization of understanding of the causes of altered bowel function will improve Outcome: Progressing   Problem: Activity: Goal: Ability to tolerate increased activity will improve Outcome: Progressing   Problem: Bowel/Gastric: Goal: Gastrointestinal status for postoperative  course will improve Outcome: Progressing   Problem: Health Behavior/Discharge Planning: Goal: Identification of community resources to assist with postoperative recovery needs will improve Outcome: Progressing   Problem: Nutritional: Goal: Will attain and maintain optimal nutritional status will improve Outcome: Progressing   Problem: Clinical Measurements: Goal: Postoperative complications will be avoided or minimized Outcome: Progressing   Problem: Respiratory: Goal: Respiratory status will improve Outcome: Progressing   Problem: Skin Integrity: Goal: Will show signs of wound healing Outcome: Progressing   Problem: Education: Goal: Knowledge of the prescribed therapeutic regimen will improve Outcome: Progressing   Problem: Bowel/Gastric: Goal: Gastrointestinal status for postoperative course will improve Outcome: Progressing   Problem: Cardiac: Goal: Ability to maintain an adequate cardiac output Outcome: Progressing Goal: Will show no evidence of cardiac arrhythmias Outcome: Progressing   Problem: Nutritional: Goal: Will attain and maintain optimal nutritional status Outcome: Progressing   Problem: Neurological: Goal: Will regain or maintain usual level of consciousness Outcome: Progressing   Problem: Clinical Measurements: Goal: Ability to maintain clinical measurements within normal limits Outcome: Progressing Goal: Postoperative complications will be avoided or minimized Outcome: Progressing   Problem: Respiratory: Goal: Will regain and/or maintain adequate ventilation Outcome: Progressing Goal: Respiratory status will improve Outcome: Progressing   Problem: Skin Integrity: Goal: Demonstrates signs of wound healing without infection Outcome: Progressing   Problem: Urinary Elimination: Goal: Will remain free from infection Outcome: Progressing Goal: Ability to achieve and maintain adequate urine output Outcome: Progressing   "

## 2024-10-31 NOTE — Progress Notes (Signed)
 " Progress Note   Patient: Michelle Levy FMW:[id number] DOB: February 02, 1956 DOA: 10/28/2024     3 DOS: the patient was seen and examined on 10/31/2024   Brief hospital course: MALLARY KREGER is a 69 y.o. female with history of bipolar disorder who was recently admitted last month from 10/02/2024 through 10/05/2024 for diverticulitis with abscess managed conservatively with antibiotics states that since Wednesday about 4 days ago patient has been having abdominal discomfort mostly in the left lower quadrant.  In the ER CT showed features concerning for large bowel obstruction and colovesical fistula.  Laboratory findings showed sodium of 123, white count 12.6, abnormal UA.  General surgery consulted.  Patient is started on empiric antibiotics, NG tube decompression admitted to TRH service for further management evaluation.  Assessment and Plan: Large bowel obstruction Possible colovesical fistula- Sigmoid diverticulitis with abscess causing obstruction- S/p Hartmann procedure, partial colectomy with end colostomy, omentopexy of bladder fistula, serosal repair of cecum, placement of pelvic drain. Continue Zosyn . Follow cultures. Continue pain control. N.p.o., continue NG to low wall suction. General surgery follow-up appreciated. Further management per surgery team. Will follow path results.  Hyponatremia- Chronic, baseline sodium 127-130. Continue gentle IV fluids. Trend sodium.  Hypokalemia- Improved with supplementation. Continue to recheck electrolytes and replete accordingly.  Hypermagnesemia: Improved. Hold home mag citrate supplementation.  Bipolar disorder- She  takes Depakote  at home.This is held due to her being NPO. Resume home meds when she is able to tolerate p.o. after surgery.     Out of bed to chair. Incentive spirometry. Nursing supportive care. Fall, aspiration precautions. Diet:  Diet Orders (From admission, onward)     Start     Ordered   10/31/24 1359  Diet  clear liquid Fluid consistency: Thin  Diet effective now       Question:  Fluid consistency:  Answer:  Thin   10/31/24 1359           DVT prophylaxis: SCDs Start: 10/28/24 2132  Level of care: Telemetry   Code Status: Full Code  Subjective: Patient is seen and examined today morning.  She is lying comfortably, feels better. Denies pain.  NG tube with dark fluid noted.   Physical Exam: Vitals:   10/30/24 1215 10/30/24 1244 10/30/24 1931 10/31/24 0546  BP: (!) 115/52 (!) 110/57 109/60 (!) 112/59  Pulse: 67 75 90 (!) 108  Resp: 13 16 15 15   Temp:  (!) 97.5 F (36.4 C) 98.4 F (36.9 C) 97.9 F (36.6 C)  TempSrc:  Oral Oral   SpO2: 98% 99%    Weight:      Height:        General - Elderly Caucasian thin built female, no apparent distress HEENT - PERRLA, EOMI, atraumatic head,   NG tube intact. Lung - Clear, basal rales, rhonchi, no wheezes. Heart - S1, S2 heard, no murmurs, rubs, trace pedal edema. Abdomen - Soft, mild diffuse tender, colostomy, drain noted. Neuro - Alert, awake and oriented x 3, non focal exam. Skin - Warm and dry.  Data Reviewed:      Latest Ref Rng & Units 10/31/2024    6:02 AM 10/30/2024    5:57 AM 10/29/2024    5:31 AM  CBC  WBC 4.0 - 10.5 K/uL 14.8  13.4  10.9   Hemoglobin 12.0 - 15.0 g/dL 89.6  86.8  87.9   Hematocrit 36.0 - 46.0 % 29.8  38.0  33.5   Platelets 150 - 400 K/uL 437  491  394       Latest Ref Rng & Units 10/31/2024    6:02 AM 10/30/2024    7:18 PM 10/30/2024    5:57 AM  BMP  Glucose 70 - 99 mg/dL 885  881  74   BUN 8 - 23 mg/dL 13  10  9    Creatinine 0.44 - 1.00 mg/dL 9.11  9.22  9.23   Sodium 135 - 145 mmol/L 131  128  127   Potassium 3.5 - 5.1 mmol/L 4.1  3.8  3.5   Chloride 98 - 111 mmol/L 95  94  91   CO2 22 - 32 mmol/L 21  18  19    Calcium  8.9 - 10.3 mg/dL 8.3  7.8  8.6    No results found.   Family Communication: Discussed with patient, she understand and agree. All questions answered.  Disposition: Status is:  Inpatient Remains inpatient appropriate because: S/p [facility] surgery, IV antibiotics, NG tube, NPO, surgery team follow-up  Planned Discharge Destination: Home with Home Health     Time spent: 45 minutes  Author: Concepcion Riser, MD 10/31/2024 5:41 PM Secure chat 7am to 7pm For on call review [email or web address].    "

## 2024-10-31 NOTE — Evaluation (Signed)
 Physical Therapy Evaluation Patient Details Name: Michelle Levy MRN: [id number] DOB: 04-21-1956 Today's Date: 10/31/2024  History of Present Illness  Patient is a 69 yo female presenting to the ED with abdominal pain, nausea, and diarrhea on 10/28/24. CT showing large bowel obstruction. Laparoscopy, omentopexy of bladder fistula, serosal repair of cecum and cystoscopy completed on 1/13. PMH includes:  alcohol use disorder, bipolar disorder, diverticulitis with intra-abdominal abscesses  Clinical Impression  Pt admitted with above diagnosis. At baseline pt independent.  She lives alone but her ex husband will be staying with her and assisting at d/c.  Today, pt very motivated and with good pain control.  She ambulated 31' with CGA and light min A for transfers.  Pt expected to progress well.  At this time recommend HHPT but may progress to no therapy needs.  Pt currently with functional limitations due to the deficits listed below (see PT Problem List). Pt will benefit from acute skilled PT to increase their independence and safety with mobility to allow discharge.           If plan is discharge home, recommend the following: A little help with walking and/or transfers;A little help with bathing/dressing/bathroom;Assistance with cooking/housework;Help with stairs or ramp for entrance   Can travel by private vehicle        Equipment Recommendations Rolling walker (2 wheels)  Recommendations for Other Services       Functional Status Assessment Patient has had a recent decline in their functional status and demonstrates the ability to make significant improvements in function in a reasonable and predictable amount of time.     Precautions / Restrictions Precautions Precautions: Fall;Other (comment) Recall of Precautions/Restrictions: Intact Precaution/Restrictions Comments: NG tube clamped, abdominal wound with wound vac, JP drain, ostomy, foley Restrictions Weight Bearing Restrictions  Per Provider Order: No      Mobility  Bed Mobility Overal bed mobility: Needs Assistance Bed Mobility: Rolling, Sidelying to Sit Rolling: Contact guard assist Sidelying to sit: Min assist       General bed mobility comments: Min A to sit upright due to lines and leads,    Transfers Overall transfer level: Needs assistance Equipment used: Rolling walker (2 wheels) Transfers: Sit to/from Stand Sit to Stand: Min assist           General transfer comment: Cues for hand placement, Min A to power up for safety, and min A of 2 for line management, excellent self pacing    Ambulation/Gait Ambulation/Gait assistance: Contact guard assist Gait Distance (Feet): 50 Feet Assistive device: Rolling walker (2 wheels) Gait Pattern/deviations: Step-to pattern, Decreased stride length Gait velocity: decreased     General Gait Details: Small steps with cues for RW use, steady balance  Stairs            Wheelchair Mobility     Tilt Bed    Modified Rankin (Stroke Patients Only)       Balance Overall balance assessment: Needs assistance Sitting-balance support: Feet supported, No upper extremity supported Sitting balance-Leahy Scale: Fair Sitting balance - Comments: did not challenge   Standing balance support: Bilateral upper extremity supported, During functional activity, No upper extremity supported Standing balance-Leahy Scale: Fair Standing balance comment: can static stand without UE support                             Pertinent Vitals/Pain Pain Assessment Pain Assessment: Faces Faces Pain Scale: Hurts a little bit Pain Location:  abodominal pain Pain Descriptors / Indicators: Grimacing, Guarding, Discomfort Pain Intervention(s): Limited activity within patient's tolerance, Monitored during session, Premedicated before session, Repositioned    Home Living Family/patient expects to be discharged to:: Private residence Living Arrangements:  Alone Available Help at Discharge: Family;Available 24 hours/day (ex husband made arrangements to assist) Type of Home: House Home Access: Stairs to enter Entrance Stairs-Rails: Left Entrance Stairs-Number of Steps: 4   Home Layout: One level Home Equipment: Grab bars - tub/shower      Prior Function Prior Level of Function : Independent/Modified Independent;Driving;Working/employed             Mobility Comments: independent ADLs Comments: independent, driving, owns [employer]     Extremity/Trunk Assessment   Upper Extremity Assessment Upper Extremity Assessment: Defer to OT evaluation    Lower Extremity Assessment Lower Extremity Assessment: Overall WFL for tasks assessed    Cervical / Trunk Assessment Cervical / Trunk Assessment: Other exceptions Cervical / Trunk Exceptions: abdominal surgery  Communication   Communication Communication: No apparent difficulties    Cognition Arousal: Alert Behavior During Therapy: WFL for tasks assessed/performed   PT - Cognitive impairments: No apparent impairments                                 Cueing       General Comments General comments (skin integrity, edema, etc.): VSS    Exercises     Assessment/Plan    PT Assessment Patient needs continued PT services  PT Problem List Decreased strength;Decreased range of motion;Decreased activity tolerance;Decreased balance;Decreased mobility;Decreased knowledge of use of DME       PT Treatment Interventions DME instruction;Therapeutic exercise;Gait training;Stair training;Functional mobility training;Therapeutic activities;Patient/family education;Balance training    PT Goals (Current goals can be found in the Care Plan section)  Acute Rehab PT Goals Patient Stated Goal: return home PT Goal Formulation: With patient Time For Goal Achievement: 11/14/24 Potential to Achieve Goals: Good    Frequency Min 2X/week     Co-evaluation                AM-PAC PT 6 Clicks Mobility  Outcome Measure Help needed turning from your back to your side while in a flat bed without using bedrails?: A Little Help needed moving from lying on your back to sitting on the side of a flat bed without using bedrails?: A Little Help needed moving to and from a bed to a chair (including a wheelchair)?: A Little Help needed standing up from a chair using your arms (e.g., wheelchair or bedside chair)?: A Little Help needed to walk in hospital room?: A Little Help needed climbing 3-5 steps with a railing? : A Little 6 Click Score: 18    End of Session Equipment Utilized During Treatment: Gait belt (under arms) Activity Tolerance: Patient tolerated treatment well Patient left: in chair;with call bell/phone within reach Nurse Communication: Mobility status PT Visit Diagnosis: Other abnormalities of gait and mobility (R26.89)    Time: 8783-8759 PT Time Calculation (min) (ACUTE ONLY): 24 min   Charges:   PT Evaluation $PT Eval Moderate Complexity: 1 Mod   PT General Charges $$ ACUTE PT VISIT: 1 Visit         Benjiman, PT Acute Rehab Services [facility] [phone]   Benjiman VEAR Mulberry 10/31/2024, 2:32 PM

## 2024-10-31 NOTE — Evaluation (Signed)
 Occupational Therapy Evaluation Patient Details Name: Michelle Levy MRN: [id number] DOB: 1955/12/08 Today's Date: 10/31/2024   History of Present Illness   Patient is a 69 yo female presenting to the ED with abdominal pain, nausea, and diarrhea on 10/28/24. CT showing large bowel obstruction. Laparoscopy, omentopexy of bladder fistula, serosal repair of cecum and cystoscopy completed on 1/13. PMH includes:  alcohol use disorder, bipolar disorder, diverticulitis with intra-abdominal abscesses     Clinical Impressions Prior to this admission, patient living alone, working, driving, and active in her church. Patient did not require any AD for ambulation. Currently, patient with minimal abdominal pain, and with NG tube (clamped prior to session), wound vac for abdomen, JP drain, and foley. Patient benefitting from log roll technique for OOB activity, and CGA for ADL management (lower body not fully assessed this date due to wounds). Patient min A for functional mobility (benefitting from +2 for line management) and excellent self pacing. OT recommending HHOT at discharge, however may progress past needing depending on length of hospital stay. OT will continue to follow acutely.      If plan is discharge home, recommend the following:   A little help with walking and/or transfers;A little help with bathing/dressing/bathroom;Assistance with cooking/housework;Assist for transportation;Help with stairs or ramp for entrance     Functional Status Assessment   Patient has had a recent decline in their functional status and demonstrates the ability to make significant improvements in function in a reasonable and predictable amount of time.     Equipment Recommendations   None recommended by OT     Recommendations for Other Services         Precautions/Restrictions   Precautions Precautions: Fall;Other (comment) Recall of Precautions/Restrictions: Intact Precaution/Restrictions  Comments: NG tube clamped, abdominal wound with wound vac, JP drain, ostomy, foley     Mobility Bed Mobility Overal bed mobility: Needs Assistance Bed Mobility: Rolling, Sidelying to Sit Rolling: Contact guard assist Sidelying to sit: Min assist       General bed mobility comments: Min A to sit upright due to lines and leads, no physical assist needed    Transfers Overall transfer level: Needs assistance Equipment used: Rolling walker (2 wheels) Transfers: Sit to/from Stand Sit to Stand: Min assist           General transfer comment: Cues for hand placement, Min A to power up for safety, and min A of 2 for line management, excellent self pacing      Balance Overall balance assessment: Needs assistance Sitting-balance support: Bilateral upper extremity supported, Feet supported Sitting balance-Leahy Scale: Fair Sitting balance - Comments: did not challenge   Standing balance support: Bilateral upper extremity supported, During functional activity, Reliant on assistive device for balance Standing balance-Leahy Scale: Fair Standing balance comment: can static stand without issue                           ADL either performed or assessed with clinical judgement   ADL Overall ADL's : Needs assistance/impaired Eating/Feeding: NPO   Grooming: Set up;Brushing hair;Sitting   Upper Body Bathing: Set up;Sitting   Lower Body Bathing: Minimal assistance;Sitting/lateral leans;Sit to/from stand   Upper Body Dressing : Set up;Sitting   Lower Body Dressing: Minimal assistance;Sitting/lateral leans;Sit to/from stand   Toilet Transfer: Minimal assistance;Rolling walker (2 wheels);+2 for safety/equipment;Ambulation   Toileting- Clothing Manipulation and Hygiene: Contact guard assist;Sitting/lateral lean;Sit to/from stand       Functional mobility during  ADLs: Contact guard assist;+2 for safety/equipment;Rolling walker (2 wheels) General ADL Comments: Prior to this  admission, patient living alone, working, driving, and active in her church. Patient did not require any AD for ambulation. Currently, patient with minimal abdominal pain, and with NG tube (clamped prior to session), wound vac for abdomen, JP drain, and foley. Patient benefitting from log roll technique for OOB activity, and CGA for ADL management (lower body not fully assessed this date due to wounds). Patient min A for functional mobility (benefitting from +2 for line management) and excellent self pacing. OT recommending HHOT at discharge, however may progress past needing depending on length of hospital stay. OT will continue to follow acutely.     Vision Baseline Vision/History: 1 Wears glasses Ability to See in Adequate Light: 0 Adequate Patient Visual Report: No change from baseline Vision Assessment?: No apparent visual deficits     Perception Perception: Not tested       Praxis Praxis: Not tested       Pertinent Vitals/Pain Pain Assessment Pain Assessment: Faces Faces Pain Scale: Hurts a little bit Pain Location: abodominal pain Pain Descriptors / Indicators: Grimacing, Guarding, Discomfort Pain Intervention(s): Limited activity within patient's tolerance, Monitored during session, Repositioned, Premedicated before session     Extremity/Trunk Assessment Upper Extremity Assessment Upper Extremity Assessment: Overall WFL for tasks assessed;Right hand dominant   Lower Extremity Assessment Lower Extremity Assessment: Defer to PT evaluation   Cervical / Trunk Assessment Cervical / Trunk Assessment: Other exceptions Cervical / Trunk Exceptions: abdominal surgery   Communication Communication Communication: No apparent difficulties   Cognition Arousal: Alert Behavior During Therapy: WFL for tasks assessed/performed Cognition: No apparent impairments                               Following commands: Intact       Cueing  General Comments   Cueing  Techniques: Verbal cues  VSS on RA   Exercises     Shoulder Instructions      Home Living Family/patient expects to be discharged to:: Private residence Living Arrangements: Alone Available Help at Discharge: Family;Available 24 hours/day (ex husband made arrangements to assist) Type of Home: House Home Access: Stairs to enter [employer] of Steps: 4 Entrance Stairs-Rails: Left Home Layout: One level     Bathroom Shower/Tub: Chief Strategy Officer: Standard     Home Equipment: Grab bars - tub/shower          Prior Functioning/Environment Prior Level of Function : Independent/Modified Independent;Driving;Working/employed             Mobility Comments: independent ADLs Comments: independent, driving, owns [employer]    OT Problem List: Decreased activity tolerance;Decreased knowledge of precautions;Pain   OT Treatment/Interventions: Self-care/ADL training;Therapeutic exercise;Energy conservation;DME and/or AE instruction;Manual therapy;Modalities;Patient/family education;Balance training;Therapeutic activities      OT Goals(Current goals can be found in the care plan section)   Acute Rehab OT Goals Patient Stated Goal: to get better OT Goal Formulation: With patient Time For Goal Achievement: 11/14/24 Potential to Achieve Goals: Good   OT Frequency:  Min 2X/week    Co-evaluation              AM-PAC OT 6 Clicks Daily Activity     Outcome Measure Help from another person eating meals?: Total (NPO) Help from another person taking care of personal grooming?: A Little Help from another person toileting, which includes using toliet, bedpan, or  urinal?: A Little Help from another person bathing (including washing, rinsing, drying)?: A Little Help from another person to put on and taking off regular upper body clothing?: A Little Help from another person to put on and taking off regular lower body clothing?: A Little 6 Click  Score: 16   End of Session Equipment Utilized During Treatment: Gait belt;Rolling walker (2 wheels) Nurse Communication: Mobility status  Activity Tolerance: Patient tolerated treatment well Patient left: in chair;with call bell/phone within reach  OT Visit Diagnosis: Muscle weakness (generalized) (M62.81);Other abnormalities of gait and mobility (R26.89);Unsteadiness on feet (R26.81);Pain Pain - part of body:  (Abdomen)                Time: 8782-8759 OT Time Calculation (min): 23 min Charges:  OT General Charges $OT Visit: 1 Visit OT Evaluation $OT Eval Moderate Complexity: 1 Mod  Ronal Gift E. Adea Geisel, OTR/L Acute Rehabilitation Services [phone]   Ronal Gift Salt 10/31/2024, 2:12 PM

## 2024-11-01 DIAGNOSIS — F3163 Bipolar disorder, current episode mixed, severe, without psychotic features: Secondary | ICD-10-CM | POA: Diagnosis not present

## 2024-11-01 DIAGNOSIS — E871 Hypo-osmolality and hyponatremia: Secondary | ICD-10-CM | POA: Diagnosis not present

## 2024-11-01 DIAGNOSIS — N321 Vesicointestinal fistula: Secondary | ICD-10-CM | POA: Diagnosis not present

## 2024-11-01 DIAGNOSIS — K5720 Diverticulitis of large intestine with perforation and abscess without bleeding: Secondary | ICD-10-CM | POA: Diagnosis not present

## 2024-11-01 DIAGNOSIS — E876 Hypokalemia: Secondary | ICD-10-CM | POA: Diagnosis not present

## 2024-11-01 DIAGNOSIS — K56609 Unspecified intestinal obstruction, unspecified as to partial versus complete obstruction: Secondary | ICD-10-CM | POA: Diagnosis not present

## 2024-11-01 LAB — BASIC METABOLIC PANEL WITH GFR
Anion gap: 7 (ref 5–15)
Anion gap: 7 (ref 5–15)
BUN: 17 mg/dL (ref 8–23)
BUN: 19 mg/dL (ref 8–23)
CO2: 27 mmol/L (ref 22–32)
CO2: 28 mmol/L (ref 22–32)
Calcium: 8 mg/dL — ABNORMAL LOW (ref 8.9–10.3)
Calcium: 7.8 mg/dL — ABNORMAL LOW (ref 8.9–10.3)
Chloride: 98 mmol/L (ref 98–111)
Chloride: 96 mmol/L — ABNORMAL LOW (ref 98–111)
Creatinine, Ser: 0.74 mg/dL (ref 0.44–1.00)
Creatinine, Ser: 0.66 mg/dL (ref 0.44–1.00)
GFR, Estimated: >60 mL/min
GFR, Estimated: >60 mL/min
Glucose, Bld: 94 mg/dL (ref 70–99)
Glucose, Bld: 135 mg/dL — ABNORMAL HIGH (ref 70–99)
Potassium: 4 mmol/L (ref 3.5–5.1)
Potassium: 4.7 mmol/L (ref 3.5–5.1)
Sodium: 132 mmol/L — ABNORMAL LOW (ref 135–145)
Sodium: 130 mmol/L — ABNORMAL LOW (ref 135–145)

## 2024-11-01 LAB — CBC
HCT: 27.2 % — ABNORMAL LOW (ref 36.0–46.0)
HCT: 29.2 % — ABNORMAL LOW (ref 36.0–46.0)
Hemoglobin: 9.3 g/dL — ABNORMAL LOW (ref 12.0–15.0)
Hemoglobin: 10 g/dL — ABNORMAL LOW (ref 12.0–15.0)
MCH: 30.5 pg (ref 26.0–34.0)
MCH: 30.7 pg (ref 26.0–34.0)
MCHC: 34.2 g/dL (ref 30.0–36.0)
MCHC: 34.2 g/dL (ref 30.0–36.0)
MCV: 89.2 fL (ref 80.0–100.0)
MCV: 89.6 fL (ref 80.0–100.0)
Platelets: 420 K/uL — ABNORMAL HIGH (ref 150–400)
Platelets: 437 K/uL — ABNORMAL HIGH (ref 150–400)
RBC: 3.05 MIL/uL — ABNORMAL LOW (ref 3.87–5.11)
RBC: 3.26 MIL/uL — ABNORMAL LOW (ref 3.87–5.11)
RDW: 14.7 % (ref 11.5–15.5)
RDW: 14.6 % (ref 11.5–15.5)
WBC: 12.2 K/uL — ABNORMAL HIGH (ref 4.0–10.5)
WBC: 14.4 K/uL — ABNORMAL HIGH (ref 4.0–10.5)
nRBC: 0 % (ref 0.0–0.2)
nRBC: 0 % (ref 0.0–0.2)

## 2024-11-01 LAB — SURGICAL PATHOLOGY

## 2024-11-01 MED ORDER — OXYCODONE HCL 5 MG PO TABS
5.0000 mg | ORAL_TABLET | ORAL | Status: DC | PRN
  Administered 2024-11-01 – 2024-11-02 (×2): 5 mg via ORAL
  Administered 2024-11-02 – 2024-11-03 (×2): 10 mg via ORAL
  Administered 2024-11-04: 5 mg via ORAL
  Filled 2024-11-01 (×2): qty 1
  Filled 2024-11-01: qty 2
  Filled 2024-11-01: qty 1
  Filled 2024-11-01: qty 2

## 2024-11-01 MED ORDER — CALCIUM POLYCARBOPHIL 625 MG PO TABS
625.0000 mg | ORAL_TABLET | Freq: Two times a day (BID) | ORAL | Status: DC
  Administered 2024-11-01 – 2024-11-04 (×7): 625 mg via ORAL
  Filled 2024-11-01 (×7): qty 1

## 2024-11-01 MED ORDER — SODIUM CHLORIDE 0.9% FLUSH
3.0000 mL | Freq: Two times a day (BID) | INTRAVENOUS | Status: DC
  Administered 2024-11-01 – 2024-11-04 (×7): 3 mL via INTRAVENOUS

## 2024-11-01 MED ORDER — DIVALPROEX SODIUM ER 500 MG PO TB24
750.0000 mg | ORAL_TABLET | Freq: Every day | ORAL | Status: DC
  Administered 2024-11-01 – 2024-11-03 (×3): 750 mg via ORAL
  Filled 2024-11-01 (×3): qty 1

## 2024-11-01 MED ORDER — ACETAMINOPHEN 500 MG PO TABS
1000.0000 mg | ORAL_TABLET | Freq: Four times a day (QID) | ORAL | Status: DC
  Administered 2024-11-01 – 2024-11-04 (×9): 1000 mg via ORAL
  Filled 2024-11-01 (×10): qty 2

## 2024-11-01 MED ORDER — SODIUM CHLORIDE 0.9% FLUSH: 3.0000 mL | INTRAVENOUS | Status: DC | PRN

## 2024-11-01 MED ORDER — ENSURE SURGERY PO LIQD
237.0000 mL | Freq: Two times a day (BID) | ORAL | Status: DC
  Administered 2024-11-01 – 2024-11-03 (×5): 237 mL via ORAL
  Filled 2024-11-01 (×7): qty 237

## 2024-11-01 MED ORDER — METHOCARBAMOL 500 MG PO TABS
1000.0000 mg | ORAL_TABLET | Freq: Four times a day (QID) | ORAL | Status: DC | PRN
  Administered 2024-11-03: 1000 mg via ORAL
  Filled 2024-11-01 (×2): qty 2

## 2024-11-01 MED ORDER — SODIUM CHLORIDE 0.9 % IV SOLN: 250.0000 mL | INTRAVENOUS | Status: DC | PRN

## 2024-11-01 NOTE — Progress Notes (Addendum)
 11/01/2024  Michelle Levy [id number] 05-19-1956  CARE TEAM: PCP: Aisha Harvey, MD  Outpatient Care Team: Patient Care Team: Aisha Harvey, MD as PCP - General (Family Medicine)  Inpatient Treatment Team: Treatment Team:  Darci Pore, MD Ccs, Md, MD Sherree Stephane KIDD, RN Bobbette Mattocks, MD Latanya Ronal CROME, NT Osie Iantha SQUIBB, [facility] Herlene Isidor [place], LPN Duwaine Jenkins DASEN, RN Ramirez-Flores, Erminio Hoit, Michelle MARLA, RN   Problem List:   Principal Problem:   Bowel obstruction (HCC) Active Problems:   Bipolar disorder, curr episode mixed, severe, w/o psychotic features (HCC)   Abscess of sigmoid colon due to diverticulitis   Hyponatremia   Hypokalemia   Hypermagnesemia   Colovesical fistula   10/30/2024  Procedures: CYSTOSCOPY WITH INDOCYANINE GREEN  IMAGING (ICG) DIAGNOSTIC LAPAROSCOPY, OMENTOPEXY OF BLADDER FISTULA, AND SEROSAL REPAIR OF CECUM HARTMANN PROCEDURE    Assessment [facility] Stay = 4 days) 2 Days Post-Op    Patient is recovering relatively well.  Abdomen is non distended, dressing is clean dry and intact. No distention. Mild pain at incision sites only.  Flatus and stool present in colostomy bag.   Labs reviewed; NA+ is still low but has continued to trend upward following her surgery.  HCB is still low but increased since yesterday from 9.3 - 10.0.  NG tube removed continue with full liquid diet   Plan:  - Nutrition consult  - Ensures BID -monitor electrolytes & replace as needed  Keep K>4, Mg>2, Phos>3  -VTE prophylaxis- SCDs.  Anticoagulation prophyllaxis SQ as appropriate  -mobilize as tolerated to help recovery.  Enlist therapies in moderate/high risk patients as appropriate    -Disposition:  1-2 Days depending on patients recovery.    I reviewed last 24 h vitals and pain scores, last 48 h intake and output, last 24 h labs and trends, and last 24 h imaging results.  I have reviewed this patient's available data, including  medical history, events of note, test results, etc as part of my evaluation.   A significant portion of that time was spent in counseling. Care during the described time interval was provided by me.  This care required moderate level of medical decision making.  11/01/2024    Subjective: (Chief complaint)  Patient stated overall feeling well  Ambulating to hall and chair  Occasional nausea with ambulation only. Denies vomiting or hiccups.  Foley removed this morning she has yet to urinate.  NG tube removed this morning. She has yet to eat.    Objective:  Vital signs:  Vitals:   10/30/24 1931 10/31/24 0546 10/31/24 2017 11/01/24 0533  BP: 109/60 (!) 112/59 106/61 (!) 104/59  Pulse: 90 (!) 108 98 89  Resp: 15 15 13 14   Temp: 98.4 F (36.9 C) 97.9 F (36.6 C) 97.9 F (36.6 C) 98.1 F (36.7 C)  TempSrc: Oral  Oral Oral  SpO2:   96% 96%  Weight:      Height:        Last BM Date : (P) 10/31/24  Intake/Output   Yesterday:  01/14 0701 - 01/15 0700 In: -  Out: 850 [Urine:450; Drains:300; Stool:100] This shift:  No intake/output data recorded.  Bowel function:  Flatus: No  BM:  YES  Drain: Serosanguinous   Physical Exam:  General: Pt awake/alert in no acute distress Eyes: PERRL, normal EOM.  Sclera clear.  No icterus Neuro: CN II-XII intact w/o focal sensory/motor deficits. Lymph: No head/neck/groin lymphadenopathy Psych:  No delerium/psychosis/paranoia.  Oriented x 4 HENT: Normocephalic,  Mucus membranes moist.  No thrush Neck: Supple, No tracheal deviation.  No obvious thyromegaly Chest: No pain to chest wall compression.  Good respiratory excursion.  No audible wheezing CV:  Pulses intact.  Regular rhythm.  No major extremity edema MS: Normal AROM mjr joints.  No obvious deformity  Abdomen: Soft.  Nondistended.  Mildly tender at incisions only.  No evidence of peritonitis.  No incarcerated hernias.  Ext: No deformity.  No mjr edema.  No  cyanosis Skin: No petechiae / purpurea.  No major sores.  Warm and dry    Results:   Cultures: Recent Results (from the past 720 hours)  Blood culture (routine x 2)     Status: None (Preliminary result)   Collection Time: 10/28/24  4:01 PM   Specimen: BLOOD  Result Value Ref Range Status   Specimen Description   Final    BLOOD BLOOD RIGHT HAND Performed at [facility], [address], [place], [place] [address]    Special Requests   Final    BOTTLES DRAWN AEROBIC AND ANAEROBIC Blood Culture results may not be optimal due to an inadequate volume of blood received in culture bottles Performed at [facility], [address] [place], [place] [address]    Culture   Final    NO GROWTH 4 DAYS Performed at [facility], [address] [place], [place] [address]    Report Status PENDING  Incomplete  Blood culture (routine x 2)     Status: None (Preliminary result)   Collection Time: 10/29/24  5:31 AM   Specimen: BLOOD LEFT ARM  Result Value Ref Range Status   Specimen Description   Final    BLOOD LEFT ARM Performed at [facility], [address], [place], [place] [address]    Special Requests   Final    BOTTLES DRAWN AEROBIC AND ANAEROBIC Blood Culture adequate volume Performed at [facility], [address], [place], [place] [address]    Culture   Final    NO GROWTH 3 DAYS Performed at [facility], [address] [place], [place] [address]    Report Status PENDING  Incomplete  Urine Culture     Status: None   Collection Time: 10/29/24  9:41 AM   Specimen: Urine, Random  Result Value Ref Range Status   Specimen Description   Final    URINE, RANDOM Performed at [facility], [address], [place], [place] [address]    Special Requests   Final    NONE Reflexed from [id number] Performed at [facility], [address] [place], [place] [address]    Culture    Final    NO GROWTH Performed at [facility], [address] [place], [place] [address]    Report Status 10/30/2024 FINAL  Final    Labs: Results for orders placed or performed during the hospital encounter of 10/28/24 (from the past 48 hours)  Basic metabolic panel with GFR     Status: Abnormal   Collection Time: 10/30/24  7:18 PM  Result Value Ref Range   Sodium 128 (L) 135 - 145 mmol/L   Potassium 3.8 3.5 - 5.1 mmol/L   Chloride 94 (L) 98 - 111 mmol/L   CO2 18 (L) 22 - 32 mmol/L   Glucose, Bld 118 (H) 70 - 99 mg/dL    Comment: Glucose reference range applies only to samples taken after fasting  for at least 8 hours.   BUN 10 8 - 23 mg/dL   Creatinine, Ser 9.22 0.44 - 1.00 mg/dL   Calcium  7.8 (L) 8.9 - 10.3 mg/dL   GFR, Estimated >39 >39 mL/min    Comment: (NOTE) Calculated using the CKD-EPI Creatinine Equation (2021)    Anion gap 16 (H) 5 - 15    Comment: Performed at [facility], [address] [place], [place] [address]  Magnesium      Status: None   Collection Time: 10/30/24  7:18 PM  Result Value Ref Range   Magnesium  1.9 1.7 - 2.4 mg/dL    Comment: Performed at [facility], [address], [place], [place] [address]  Basic metabolic panel with GFR     Status: Abnormal   Collection Time: 10/31/24  6:02 AM  Result Value Ref Range   Sodium 131 (L) 135 - 145 mmol/L   Potassium 4.1 3.5 - 5.1 mmol/L   Chloride 95 (L) 98 - 111 mmol/L   CO2 21 (L) 22 - 32 mmol/L   Glucose, Bld 114 (H) 70 - 99 mg/dL    Comment: Glucose reference range applies only to samples taken after fasting for at least 8 hours.   BUN 13 8 - 23 mg/dL   Creatinine, Ser 9.11 0.44 - 1.00 mg/dL   Calcium  8.3 (L) 8.9 - 10.3 mg/dL   GFR, Estimated >39 >39 mL/min    Comment: (NOTE) Calculated using the CKD-EPI Creatinine Equation (2021)    Anion gap 16 (H) 5 - 15    Comment: Performed at [facility], [address], [place], [place]  [address]  Magnesium      Status: None   Collection Time: 10/31/24  6:02 AM  Result Value Ref Range   Magnesium  2.1 1.7 - 2.4 mg/dL    Comment: Performed at [facility], [address] [place], [place] [address]  CBC     Status: Abnormal   Collection Time: 10/31/24  6:02 AM  Result Value Ref Range   WBC 14.8 (H) 4.0 - 10.5 K/uL   RBC 3.35 (L) 3.87 - 5.11 MIL/uL   Hemoglobin 10.3 (L) 12.0 - 15.0 g/dL   HCT 70.1 (L) 63.9 - 53.9 %   MCV 89.0 80.0 - 100.0 fL   MCH 30.7 26.0 - 34.0 pg   MCHC 34.6 30.0 - 36.0 g/dL   RDW 85.8 88.4 - 84.4 %   Platelets 437 (H) 150 - 400 K/uL   nRBC 0.0 0.0 - 0.2 %    Comment: Performed at [facility], [address] [place], [place] [address]  Prealbumin     Status: Abnormal   Collection Time: 10/31/24  6:02 AM  Result Value Ref Range   Prealbumin 6 (L) 18 - 38 mg/dL    Comment: Performed at [facility], [address] [place], [place] [address]  Basic metabolic panel with GFR     Status: Abnormal   Collection Time: 10/31/24  5:36 PM  Result Value Ref Range   Sodium 131 (L) 135 - 145 mmol/L   Potassium 4.0 3.5 - 5.1 mmol/L   Chloride 96 (L) 98 - 111 mmol/L   CO2 24 22 - 32 mmol/L   Glucose, Bld 234 (H) 70 - 99 mg/dL    Comment: Glucose reference range applies only to samples taken after fasting for at least 8 hours.   BUN 19 8 - 23 mg/dL   Creatinine,  Ser 1.08 (H) 0.44 - 1.00 mg/dL   Calcium  8.2 (L) 8.9 - 10.3 mg/dL   GFR, Estimated 55 (L) >60 mL/min    Comment: (NOTE) Calculated using the CKD-EPI Creatinine Equation (2021)    Anion gap 11 5 - 15    Comment: Performed at [facility], [address] [place], [place] [address]  Basic metabolic panel with GFR     Status: Abnormal   Collection Time: 11/01/24  6:22 AM  Result Value Ref Range   Sodium 132 (L) 135 - 145 mmol/L   Potassium 4.0 3.5 - 5.1 mmol/L   Chloride 98 98 - 111 mmol/L   CO2 27 22 - 32 mmol/L   Glucose, Bld 94 70  - 99 mg/dL    Comment: Glucose reference range applies only to samples taken after fasting for at least 8 hours.   BUN 17 8 - 23 mg/dL   Creatinine, Ser 9.25 0.44 - 1.00 mg/dL   Calcium  8.0 (L) 8.9 - 10.3 mg/dL   GFR, Estimated >39 >39 mL/min    Comment: (NOTE) Calculated using the CKD-EPI Creatinine Equation (2021)    Anion gap 7 5 - 15    Comment: Performed at [facility], [address] [place], [place] [address]  CBC     Status: Abnormal   Collection Time: 11/01/24  6:22 AM  Result Value Ref Range   WBC 12.2 (H) 4.0 - 10.5 K/uL   RBC 3.05 (L) 3.87 - 5.11 MIL/uL   Hemoglobin 9.3 (L) 12.0 - 15.0 g/dL   HCT 72.7 (L) 63.9 - 53.9 %   MCV 89.2 80.0 - 100.0 fL   MCH 30.5 26.0 - 34.0 pg   MCHC 34.2 30.0 - 36.0 g/dL   RDW 85.2 88.4 - 84.4 %   Platelets 420 (H) 150 - 400 K/uL   nRBC 0.0 0.0 - 0.2 %    Comment: Performed at [facility], [address], [place], [place] [address]  CBC     Status: Abnormal   Collection Time: 11/01/24  8:27 AM  Result Value Ref Range   WBC 14.4 (H) 4.0 - 10.5 K/uL   RBC 3.26 (L) 3.87 - 5.11 MIL/uL   Hemoglobin 10.0 (L) 12.0 - 15.0 g/dL   HCT 70.7 (L) 63.9 - 53.9 %   MCV 89.6 80.0 - 100.0 fL   MCH 30.7 26.0 - 34.0 pg   MCHC 34.2 30.0 - 36.0 g/dL   RDW 85.3 88.4 - 84.4 %   Platelets 437 (H) 150 - 400 K/uL   nRBC 0.0 0.0 - 0.2 %    Comment: Performed at [facility], [address] [place], [place] [address]    Imaging / Studies: No results found.  Medications / Allergies: per chart  Antibiotics: Anti-infectives (From admission, onward)    Start     Dose/Rate Route Frequency Ordered Stop   10/30/24 1600  piperacillin -tazobactam (ZOSYN ) IVPB 3.375 g        3.375 g 12.5 mL/hr over 240 Minutes Intravenous Every 8 hours 10/30/24 1230 11/04/24 1559   10/30/24 0600  cefoTEtan  (CEFOTAN ) 2 g in sodium chloride  0.9 % 100 mL IVPB        2 g 200 mL/hr over 30 Minutes Intravenous On call to  O.R. 10/29/24 1219 10/30/24 0817   10/29/24 1400  neomycin  (MYCIFRADIN ) tablet 1,000 mg       Placed in And Linked Group   1,000 mg Per Tube  3 times per day 10/29/24 1219 10/29/24 2211   10/29/24 1400  metroNIDAZOLE  (FLAGYL ) tablet 1,000 mg       Placed in And Linked Group   1,000 mg Per Tube 3 times per day 10/29/24 1219 10/29/24 2211   10/29/24 0000  piperacillin -tazobactam (ZOSYN ) IVPB 3.375 g  Status:  Discontinued        3.375 g 12.5 mL/hr over 240 Minutes Intravenous Every 8 hours 10/28/24 2146 10/30/24 1230   10/28/24 1630  piperacillin -tazobactam (ZOSYN ) IVPB 3.375 g        3.375 g 100 mL/hr over 30 Minutes Intravenous  Once 10/28/24 1624 10/28/24 1753   10/28/24 1600  cefTRIAXone  (ROCEPHIN ) 1 g in sodium chloride  0.9 % 100 mL IVPB  Status:  Discontinued        1 g 200 mL/hr over 30 Minutes Intravenous  Once 10/28/24 1550 10/28/24 1611         Note: Portions of this report may have been transcribed using voice recognition software. Every effort was made to ensure accuracy; however, inadvertent computerized transcription errors may be present.   Any transcriptional errors that result from this process are unintentional.    Lauraine Ronnald Nick  PA- Student [facility]  11/01/2024  10:37 AM

## 2024-11-01 NOTE — Progress Notes (Signed)
 Present for first VAC and colostomy change.   Pathology final and reviewed with patient.  All questions answered  Michelle Levy Banter

## 2024-11-01 NOTE — Consult Note (Signed)
 WOC Nurse wound follow up Surgical PA at the bedside to assess wound appearance during Vac change.  Pt was medicated for pain prior to the procedure and tolerated with mod amt discomfort. Wound is shallow and patient may not require the Vac at discharge; refer to surgical team for their decision.  Midline abd with full thickness post-op wound, beefy red and moist, 16X1.8X.8cm; refer to surgical team notes for photo this am.  Applied one piece black foam to cont suction.  WOC team will plan to change again on Mon.  Supplies at the bedside.   WOC Nurse ostomy follow up Pouch change and teaching performed with patient using a hand held mirror.  Stoma is red and viable, flush with skin level, 1 1/2 inches.  50cc liquid brown stool in the pouch.   Applied barrier ring and one piece convex pouch.  Vac and ostomy pouch are in close proximity and unable to separate; they will both need to be changed each time.  Pt was able to stretch and apply a barrier ring and apply the pouch with assistance.  She was able to open and close the velcro to empty.  Reviewed pouching routines and ordering supplies.  Educational materials and 5 sets of each supply left at the bedside; use Supplies: barrier ring, Lawson # [phone] and convex pouch Lawson # [phone] Enrolled patient in [place] Secure Start Discharge program: Yes, today  Pt could benefit from follow-up at the outpatient ostomy clinic for further assistance after discharge.  Thank-you,  Stephane Fought MSN, RN, CWOCN, CWCN-AP, CNS Contact Mon-Fri 0700-1500: [phone]

## 2024-11-01 NOTE — Plan of Care (Signed)
   Problem: Activity: Goal: Risk for activity intolerance will decrease Outcome: Progressing   Problem: Nutrition: Goal: Adequate nutrition will be maintained Outcome: Progressing   Problem: Coping: Goal: Level of anxiety will decrease Outcome: Progressing

## 2024-11-01 NOTE — Progress Notes (Signed)
 " Progress Note   Patient: Michelle Levy FMW:[id number] DOB: [birth year] DOA: 10/28/2024     4 DOS: the patient was seen and examined on 11/01/2024   Brief hospital course: KAYDIE PETSCH is a 69 y.o. female with history of bipolar disorder who was recently admitted last month from 10/02/2024 through 10/05/2024 for diverticulitis with abscess managed conservatively with antibiotics states that since Wednesday about 4 days ago patient has been having abdominal discomfort mostly in the left lower quadrant.  In the ER CT showed features concerning for large bowel obstruction and colovesical fistula.  Laboratory findings showed sodium of 123, white count 12.6, abnormal UA.  General surgery consulted.  Patient is started on empiric antibiotics, NG tube decompression admitted to TRH service for further management evaluation.   Surgery team performed [facility] procedure, partial colectomy with colostomy, bladder fistula repair 10/30/2024.  NG tube removed  Assessment and Plan: Large bowel obstruction Possible colovesical fistula- Sigmoid diverticulitis with abscess causing obstruction- S/p Hartmann procedure, partial colectomy with end colostomy, omentopexy of bladder fistula, serosal repair of cecum, placement of pelvic drain. Path reviewed shows: Sigmoid stricture, diverticulosis and diverticulitis. Continue Zosyn  therapy. NG tube removed, patient started on full liquid diet. General surgery follow-up appreciated. Further management per surgery team.   Hyponatremia- Chronic, baseline sodium 127-130. Sodium improved to 132 Trend sodium.  Hypokalemia- Improved with supplementation. Continue to recheck electrolytes and replete accordingly.  Hypermagnesemia: Improved. Hold home mag citrate supplementation.  Bipolar disorder- Resumed Depakote  from tonight..     Out of bed to chair. Incentive spirometry. Nursing supportive care. Fall, aspiration precautions. Diet:  Diet Orders (From  admission, onward)     Start     Ordered   11/01/24 0759  Diet full liquid Room service appropriate? Yes; Fluid consistency: Thin  Diet effective now       Comments: Can switch to Dysphagia 1 pureed diet if preferred If patient experiences nausea, vomiting, worsening pain, or worsening distention; then, make NPO with sips/ice chips only until seen by MD  Question Answer Comment  Room service appropriate? Yes   Fluid consistency: Thin      11/01/24 0758           DVT prophylaxis: SCDs Start: 10/28/24 2132  Level of care: Telemetry   Code Status: Full Code  Subjective: Patient is seen and examined today morning.  She is lying comfortably, feels better.  NG tube was removed, she ordered full liquid diet.  Able to work with PT.  Surgery team did change dressing, ostomy.   Physical Exam: Vitals:   10/30/24 1931 10/31/24 0546 10/31/24 2017 11/01/24 0533  BP: 109/60 (!) 112/59 106/61 (!) 104/59  Pulse: 90 (!) 108 98 89  Resp: 15 15 13 14   Temp: 98.4 F (36.9 C) 97.9 F (36.6 C) 97.9 F (36.6 C) 98.1 F (36.7 C)  TempSrc: Oral  Oral Oral  SpO2:   96% 96%  Weight:      Height:        General - Elderly Caucasian thin built female, no apparent distress HEENT - PERRLA, EOMI, atraumatic head. Lung - Clear, basal rales, rhonchi, no wheezes. Heart - S1, S2 heard, no murmurs, rubs, trace pedal edema. Abdomen - Soft, mild diffuse tender, colostomy, drain noted. Neuro - Alert, awake and oriented x 3, non focal exam. Skin - Warm and dry.  Data Reviewed:      Latest Ref Rng & Units 11/01/2024    8:27 AM 11/01/2024    6:22  AM 10/31/2024    6:02 AM  CBC  WBC 4.0 - 10.5 K/uL 14.4  12.2  14.8   Hemoglobin 12.0 - 15.0 g/dL 89.9  9.3  89.6   Hematocrit 36.0 - 46.0 % 29.2  27.2  29.8   Platelets 150 - 400 K/uL 437  420  437       Latest Ref Rng & Units 11/01/2024    6:22 AM 10/31/2024    5:36 PM 10/31/2024    6:02 AM  BMP  Glucose 70 - 99 mg/dL 94  765  885   BUN 8 - 23 mg/dL 17   19  13    Creatinine 0.44 - 1.00 mg/dL 9.25  8.91  9.11   Sodium 135 - 145 mmol/L 132  131  131   Potassium 3.5 - 5.1 mmol/L 4.0  4.0  4.1   Chloride 98 - 111 mmol/L 98  96  95   CO2 22 - 32 mmol/L 27  24  21    Calcium  8.9 - 10.3 mg/dL 8.0  8.2  8.3    No results found.   Family Communication: Discussed with patient, she understand and agree. All questions answered.  Disposition: Status is: Inpatient Remains inpatient appropriate because: S/p Hartman surgery, IV antibiotics, advance diet, surgery team follow-up  Planned Discharge Destination: Home with Home Health     Time spent: 46 minutes  Author: Concepcion Riser, MD 11/01/2024 1:13 PM Secure chat 7am to 7pm For on call review [email or web address].    "

## 2024-11-01 NOTE — Discharge Instructions (Addendum)
 #######################################################  Ostomy Support Information  Michelle Levy heard that people get along just fine with only one of their eyes, or one of their lungs, or one of their kidneys. 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   YOU ARE NOT ALONE.  There are nearly three quarters of a million people in the [place]  who have an ostomy; people who have had surgery to remove all or part of their colons or bladders.   There is even a national association, the [employer] with over 350 local affiliated support groups that are organized by volunteers who provide peer support and counseling. [facility] has a toll free telephone num-ber, [phone] and an educational, interactive website, www.ostomy.org   An ostomy is an opening in the belly (abdominal wall) made by surgery. Ostomates are people who have had this procedure. The opening (stoma) allows the kidney or bowel to grdischarge waste. An external pouch covers the stoma to collect waste. Pouches are are a simple bag and are odor free. Different companies have disposable or reusable pouches to fit one's lifestyle. An ostomy can either be temporary or permanent.   THERE ARE THREE MAIN TYPES OF OSTOMIES Colostomy. A colostomy is a surgically created opening in the large intestine (colon). Ileostomy. An ileostomy is a surgically created opening in the small intestine. Urostomy. A urostomy is a surgically created opening to divert urine away from the bladder.  OSTOMY Care  The following guidelines will make care of your colostomy easier. Keep this information close by for quick reference.  Helpful DIET hints Eat a well-balanced diet including vegetables and fresh  fruits. Eat on a regular schedule.  Drink at least 6 to 8 glasses of fluids daily. Eat slowly in a relaxed atmosphere. Chew your food thoroughly. Avoid chewing gum, smoking, and drinking from a straw. This will help decrease the amount of air you swallow, which may help reduce gas. Eating yogurt or drinking buttermilk may help reduce gas.  To control gas at night, do not eat after 8 p.m. This will give your bowel time to quiet down before you go to bed.  If gas is a problem, you can purchase Beano. Sprinkle Beano on the first bite of food before eating to reduce gas. It has no flavor and should not change the taste of your food. You can buy Beano over the counter at your local drugstore.  Foods like fish, onions, garlic, broccoli, asparagus, and cabbage produce odor. Although your pouch is odor-proof, if you eat these foods you may notice a stronger odor when emptying your pouch. If this is a concern, you may want to limit these foods in your diet.  If you have an ileostomy, you will have chronic diarrhea & need to drink more liquids to avoid getting dehydrated.  Consider antidiarrheal medicine like imodium  (loperamide ) or Lomotil to help slow down bowel movements / diarrhea into your ileostomy bag.  GETTING TO GOOD BOWEL HEALTH WITH AN ILEOSTOMY    With the colon bypassed & not in use, you will have small bowel diarrhea.   It is important to thicken & slow your bowel movements down.   The goal: 4-6 small BOWEL MOVEMENTS A DAY It is important to drink plenty of liquids to avoid  getting dehydrated  CONTROLLING ILEOSTOMY DIARRHEA  TAKE A FIBER SUPPLEMENT (FiberCon or Benefiner soluble fiber) twice a day - to thicken stools by absorbing excess fluid and retrain the intestines to act more normally.  Slowly increase the dose over a few weeks.  Too much fiber too soon can backfire and cause cramping & bloating.  TAKE AN IRON SUPPLEMENT twice a day to naturally constipate your bowels.  Usually  ferrous sulfate 325mg  twice a day)  TAKE ANTI-DIARRHEAL MEDICINES: Loperamide  (Imodium ) can slow down diarrhea.  Start with two tablets (= 4mg ) first and then try one tablet every 6 hours.  Can go up to 2 pills four times day (8 pills of 2mg  max) Avoid if you are having fevers or severe pain.  If you are not better or start feeling worse, stop all medicines and call your doctor for advice LoMotil (Diphenoxylate / Atropine) is another medicine that can constipate & slow down bowel moevements Pepto Bismol (bismuth) can gently thicken bowels as well  If diarrhea is worse,: drink plenty of liquids and try simpler foods for a few days to avoid stressing your intestines further. Avoid dairy products (especially milk & ice cream) for a short time.  The intestines often can lose the ability to digest lactose when stressed. Avoid foods that cause gassiness or bloating.  Typical foods include beans and other legumes, cabbage, broccoli, and dairy foods.  Every person has some sensitivity to other foods, so listen to our body and avoid those foods that trigger problems for you.Call your doctor if you are getting worse or not better.  Sometimes further testing (cultures, endoscopy, X-ray studies, bloodwork, etc) may be needed to help diagnose and treat the cause of the diarrhea. Take extra anti-diarrheal medicines (maximum is 8 pills of 2mg  loperamide  a day)   Tips for POUCHING an OSTOMY   Changing Your Pouch The best time to change your pouch is in the morning, before eating or drinking anything. Your stoma can function at any time, but it will function more after eating or drinking.   Applying the pouching system  Place all your equipment close at hand before removing your pouch.  Wash your hands.  Stand or sit in front of a mirror. Use the position that works best for you. Remember that you must keep the skin around the stoma wrinkle-free for a good seal.  Gently remove the used pouch (1-piece  system) or the pouch and old wafer (2-piece system). Empty the pouch into the toilet. Save the closure clip to use again.  Wash the stoma itself and the skin around the stoma. Your stoma may bleed a little when being washed. This is normal. Rinse and pat dry. You may use a wash cloth or soft paper towels (like Bounty), mild soap (like Dial, Safeguard, or Ivory), and water . Avoid soaps that contain perfumes or lotions.  For a new pouch (1-piece system) or a new wafer (2-piece system), measure your stoma using the stoma guide in each box of supplies.  Trace the shape of your stoma onto the back of the new pouch or the back of the new wafer. Cut out the opening. Remove the paper backing and set it aside.  Optional: Apply a skin barrier powder to surrounding skin if it is irritated (bare or weeping), and dust off the excess. Optional: Apply a skin-prep wipe (such as Skin Prep or All-Kare) to the skin around the stoma, and let it dry. Do not apply this solution if the  skin is irritated (red, tender, or broken) or if you have shaved around the stoma. Optional: Apply a skin barrier paste (such as Stomahesive, Coloplast, or Premium) around the opening cut in the back of the pouch or wafer. Allow it to dry for 30 to 60 seconds.  Hold the pouch (1-piece system) or wafer (2-piece system) with the sticky side toward your body. Make sure the skin around the stoma is wrinkle-free. Center the opening on the stoma, then press firmly to your abdomen (Fig. 4). Look in the mirror to check if you are placing the pouch, or wafer, in the right position. For a 2-piece system, snap the pouch onto the wafer. Make sure it snaps into place securely.  Place your hand over the stoma and the pouch or wafer for about 30 seconds. The heat from your hand can help the pouch or wafer stick to your skin.  Add deodorant (such as Super Banish or Nullo) to your pouch. Other options include food extracts such as vanilla oil and peppermint  extract. Add about 10 drops of the deodorant to the pouch. Then apply the closure clamp. Note: Do not use toxic  chemicals or commercial cleaning agents in your pouch. These substances may harm the stoma.  Optional: For extra seal, apply tape to all 4 sides around the pouch or wafer, as if you were framing a picture. You may use any brand of medical adhesive tape. Change your pouch every 5 to 7 days. Change it immediately if a leak occurs.  Wash your hands afterwards.  If you are wearing a 2-piece system, you may use 2 new pouches per week and alternate them. Rinse the pouch with mild soap and warm water  and hang it to dry for the next day. Apply the fresh pouch. Alternate the 2 pouches like this for a week. After a week, change the wafer and begin with 2 new pouches. Place the old pouches in a plastic bag, and put them in the trash.   LIVING WITH AN OSTOMY  Emptying Your Pouch Empty your pouch when it is one-third full (of urine, stool, and/or gas). If you wait until your pouch is fuller than this, it will be more difficult to empty and more noticeable. When you empty your pouch, either put toilet paper in the toilet bowl first, or flush the toilet while you empty the pouch. This will reduce splashing. You can empty the pouch between your legs or to one side while sitting, or while standing or stooping. If you have a 2-piece system, you can snap off the pouch to empty it. Remember that your stoma may function during this time. If you wish to rinse your pouch after you empty it, a turkey baster can be helpful. When using a baster, squirt water  up into the pouch through the opening at the bottom. With a 2-piece system, you can snap off the pouch to rinse it. After rinsing  your pouch, empty it into the toilet. When rinsing your pouch at home, put a few granules of Dreft soap in the rinse water . This helps lubricate and freshen your pouch. The inside of your pouch can be sprayed with non-stick cooking  oil (Pam spray). This may help reduce stool sticking to the inside of the pouch.  Bathing You may shower or bathe with your pouch on or off. Remember that your stoma may function during this time.  The materials you use to wash your stoma and the skin around it should be  clean, but they do not need to be sterile.  Wearing Your Pouch During hot weather, or if you perspire a lot in general, wear a cover over your pouch. This may prevent a rash on your skin under the pouch. Pouch covers are sold at ostomy supply stores. Wear the pouch inside your underwear for better support. Watch your weight. Any gain or loss of 10 to 15 pounds or more can change the way your pouch fits.  Going Away From Home A collapsible cup (like those that come in travel kits) or a soft plastic squirt bottle with a pull-up top (like a travel bottle for shampoo) can be used for rinsing your pouch when you are away from home. Tilt the opening of the pouch at an upward angle when using a cup to rinse.  Carry wet wipes or extra tissues to use in public bathrooms.  Carry an extra pouching system with you at all times.  Never keep ostomy supplies in the glove compartment of your car. Extreme heat or cold can damage the skin barriers and adhesive wafers on the pouch.  When you travel, carry your ostomy supplies with you at all times. Keep them within easy reach. Do not pack ostomy supplies in baggage that will be checked or otherwise separated from you, because your baggage might be lost. If youre traveling out of the country, it is helpful to have a letter stating that you are carrying ostomy supplies as a medical necessity.  If you need ostomy supplies while traveling, look in the yellow pages of the telephone book under Surgical Supplies. Or call the local ostomy organization to find out where supplies are available.  Do not let your ostomy supplies get low. Always order new pouches before you use the last one.  Reducing  Odor Limit foods such as broccoli, cabbage, onions, fish, and garlic in your diet to help reduce odor. Each time you empty your pouch, carefully clean the opening of the pouch, both inside and outside, with toilet paper. Rinse your pouch 1 or 2 times daily after you empty it (see directions for emptying your pouch and going away from home). Add deodorant (such as Super Banish or Nullo) to your pouch. Use air deodorizers in your bathroom. Do not add aspirin to your pouch. Even though aspirin can help prevent odor, it could cause ulcers on your stoma.  When to call the doctor Call the doctor if you have any of the following symptoms: Purple, black, or white stoma Severe cramps lasting more than 6 hours Severe watery discharge from the stoma lasting more than 6 hours No output from the colostomy for 3 days Excessive bleeding from your stoma Swelling of your stoma to more than 1/2-inch larger than usual Pulling inward of your stoma below skin level Severe skin irritation or deep ulcers Bulging or other changes in your abdomen  When to call your ostomy nurse Call your ostomy/enterostomal therapy (WOCN) nurse if any of the following occurs: Frequent leaking of your pouching system Change in size or appearance of your stoma, causing discomfort or problems with your pouch Skin rash or rawness Weight gain or loss that causes problems with your pouch     FREQUENTLY ASKED QUESTIONS   Why havent you met any of these folks who have an ostomy?  Well, maybe you have! You just did not recognize them because an ostomy doesn't show. It can be kept secret if you wish. Why, maybe some of your best friends, office  associates or neighbors have an ostomy ... you never can tell. People facing ostomy surgery have many quality-of-life questions like: Will you bulge? Smell? Make noises? Will you feel waste leaving your body? Will you be a captive of the toilet? Will you starve? Be a social outcast? Get/stay  married? Have babies? Easily bathe, go swimming, bend over?  OK, lets look at what you can expect:   Will you bulge?  Remember, without part of the intestine or bladder, and its contents, you should have a flatter tummy than before. You can expect to wear, with little exception, what you wore before surgery ... and this in-cludes tight clothing and bathing suits.   Will you smell?  Today, thanks to modern odor proof pouching systems, you can walk into an ostomy support group meeting and not smell anything that is foul or offensive. And, for those with an ileostomy or colostomy who are concerned about odor when emptying their pouch, there are in-pouch deodorants that can be used to eliminate any waste odors that may exist.   Will you make noises?  Everyone produces gas, especially if they are an air-swallower. But intestinal sounds that occur from time to time are no differ-ent than a gurgling tummy, and quite often your clothing will muffle any sounds.   Will you feel the waste discharges?  For those with a colostomy or ileostomy there might be a slight pressure when waste leaves your body, but understand that the intestines have no nerve endings, so there will be no unpleasant sensations. Those with a urostomy will probably be unaware of any kidney drainage.   Will you be a captive of the toilet?  Immediately post-op you will spend more time in the bathroom than you will after your body recovers from surgery. Every person is different, but on average those with an ileostomy or urostomy may empty their pouches 4 to 6 times a day; a little  less if you have a colostomy. The average wear time between pouch system changes is 3 to 5 days and the changing process should take less than 30 minutes.   Will I need to be on a special diet? Most people return to their normal diet when they have recovered from surgery. Be sure to chew your food well, eat a well-balanced diet and drink plenty of fluids. If  you experience problems with a certain food, wait a couple of weeks and try it again.  Will there be odor and noises? Pouching systems are designed to be odor-proof or odor-resistant. There are deodorants that can be used in the pouch. Medications are also available to help reduce odor. Limit gas-producing foods and carbonated beverages. You will experience less gas and fewer noises as you heal from surgery.  How much time will it take to care for my ostomy? At first, you may spend a lot of time learning about your ostomy and how to take care of it. As you become more comfortable and skilled at changing the pouching system, it will take very little time to care for it.   Will I be able to return to work? People with ostomies can perform most jobs. As soon as you have healed from surgery, you should be able to return to work. Heavy lifting (more than 10 pounds) may be discouraged.   What about intimacy? Sexual relationships and intimacy are important and fulfilling aspects of your life. They should continue after ostomy surgery. Intimacy-related concerns should be discussed openly between you and  your partner.   Can I wear regular clothing? You do not need to wear special clothing. Ostomy pouches are fairly flat and barely noticeable. Elastic undergarments will not hurt the stoma or prevent the ostomy from functioning.   Can I participate in sports? An ostomy should not limit your involvement in sports. Many people with ostomies are runners, skiers, swimmers or participate in other active lifestyles. Talk with your caregiver first before doing heavy physical activity.  Will you starve?  Not if you follow doctors orders at each stage of your post-op adjustment. There is no such thing as an ostomy diet. Some people with an ostomy will be able to eat and tolerate anything; others may find diffi-culty with some foods. Each person is an individual and must determine, by trial, what is best for  them. A good practice for all is to drink plenty of water .   Will you be a social outcast?  Have you met anyone who has an ostomy and is a social outcast? Why should you be the first? Only your attitude and self image will effect how you are treated. No confi-dent person is an investment banker, corporate.    PROFESSIONAL HELP   Resources are available if you need help or have questions about your ostomy.   Specially trained nurses called Wound, Ostomy Continence Nurses (WOCN) are available for consultation in most major medical centers.  Consider getting an ostomy consult at an outpatient ostomy clinic.   [facility] has an Ostomy Clinic run by an Chartered Certified Accountant at the [facility].  [phone]. [facility] can help set up an appointment   The [employer] ([employer]) is a group made up of many local chapters throughout the [place] . These local groups hold meetings and provide support to prospective and existing ostomates. They sponsor educational events and have qualified visitors to make personal or telephone visits. Contact the [employer] for the chapter nearest you and for other educational publications.  More detailed information can be found in Colostomy Guide, a publication of the [employer] ([employer]OA). Contact [employer]OA at 1-(380)856-8661 or visit their web site at yellowspecialist.at. The website contains links to other sites, suppliers and resources.  [employer] Start Services: Start at the website to enlist for support.  Your Wound Ostomy (WOCN) nurse may have started this process. [email or web address] Secure Start services are designed to support people as they live their lives with an ostomy or neurogenic bladder. Enrolling is easy and at no cost to the patient. We realize that each person's needs and life journey are different. Through Secure Start services, we want to help people live their life, their way.  WOUND CARE  It is important  that the wound be kept open.   -Keeping the skin edges apart will allow the wound to gradually heal from the base upwards.   - If the skin edges of the wound close too early, a new fluid pocket can form and infection can occur. -This is the reason to pack deeper wounds with gauze or ribbon -This is why drained wounds cannot be sewed closed right away  A healthy wound should form a lining of bright red beefy granulating tissue that will help shrink the wound and help the edges grow new skin into it.   -A little mucus / yellow discharge is normal (the body's natural way to try and form a scab) and should be gently washed off with soap and water  with daily dressing changes.  -  Green or foul smelling drainage implies bacterial colonization and can slow wound healing - a short course of antibiotic ointment (3-5 days) can help it clear up.  Call the doctor if it does not improve or worsens  -Avoid use of antibiotic ointments for more than a week as they can slow wound healing over time.    -Sometimes other wound care products will be used to reduce need for dressing changes and/or help clean up dirty wounds -Sometimes the surgeon needs to debride the wound in the office to remove dead or infected tissue out of the wound so it can heal more quickly and safely.    Change the dressing at least once a day -Wash the wound with mild soap and water  gently every day.  It is good to shower or bathe the wound to help it clean out. -Use clean 4x4 gauze for medium/large wounds or ribbon plain NU-gauze for smaller wounds (it does not need to be sterile, just clean) -Keep the raw wound moist with a little saline or KY (saline) gel on the gauze.  -A dry wound will take longer to heal.  -Keep the skin dry around the wound to prevent breakdown and irritation. -Pack the wound down to the base -The goal is to keep the skin apart, not overpack the wound -Use a Q-tip or blunt-tipped kabob stick toothpick to push the  gauze down to the base in narrow or deep wounds   -Cover with a clean gauze and tape -paper or Medipore tape tend to be gentle on the skin -rotate the orientation of the tape to avoid repeated stress/trauma on the skin -using an ACE or Coban wrap on wounds on arms or legs can be used instead.  Complete all antibiotics through the entire prescription to help the infection heal and prevent new places of infection   Returning the see the surgeon is helpful to follow the healing process and help the wound close as fast as possible.   #######################################################    EATING AFTER A DIVERTICULITIS ATTACK   EAT START WITH PUREED OR SOFT FOODS Gradually transition to a high fiber diet with a fiber supplement over the next few days after discharge  WALK Walk an hour a day.  Control your pain to do that.    CONTROL PAIN Control pain so that you can walk, sleep, tolerate sneezing/coughing, go up/down stairs.  HAVE A BOWEL MOVEMENT DAILY Keep your bowels regular to avoid problems.  OK to try a laxative to override constipation.  OK to use an antidairrheal to slow down diarrhea.  Call if not better after 2 tries  CALL IF YOU HAVE PROBLEMS/CONCERNS Call if you are still struggling despite following these instructions. Call if you have concerns not answered by these instructions     After your attack of DIVERTICULITIS, expect some issues over the next few weeks.    To help you through this temporary phase, we start you out on a pureed (blenderized) diet.  Your first meal in the hospital was thin liquids.  You should have been given a pureed diet by the time you left the hospital.  We ask patients to stay on a pureed diet for the first few days to avoid anything getting stuck.  Don't be alarmed if your ability to swallow doesn't progress according to this plan.  Everyone is different and some diets can advance more or less quickly.     Some BASIC RULES to  follow are: Maintain an upright position  whenever eating or drinking. Take small bites - just a teaspoon size bite at a time. Eat slowly.  It may also help to eat only one food at a time. Consider nibbling through smaller, more frequent meals & avoid the urge to eat BIG meals Do not push through feelings of fullness, nausea, or bloatedness Do not mix solid foods and liquids in the same mouthful Try not to wash foods down with large gulps of liquids. Avoid carbonated (bubbly/fizzy) drinks.   Avoid foods that make you feel gassy or bloated.  Start with bland foods first.  Wait on trying greasy, fried, or spicy meals until you are tolerating more bland solids well. Expect to be more gassy/flatulent/bloated initially.  Walking will help your body manage it better. Consider using medications for bloating that contain simethicone  such as  Maalox or Gas-X  Eat in a relaxed atmosphere & minimize distractions. Avoid talking while eating.   Do not use straws. Following each meal, sit in an upright position (90 degree angle) for 60 to 90 minutes.  Going for a short walk can help as well If food does stick, don't panic.  Try to relax and let the food pass on its own.  Sipping WARM LIQUID such as strong hot black tea can also help slide it down.   Be gradual in changes & use common sense:  -If you easily tolerating a certain level of foods, advance to the next level gradually -If you are having trouble swallowing a particular food, then avoid it.   -If food is sticking when you advance your diet, go back to thinner previous diet (the lower LEVEL) for 1-2 days.  LEVEL 1 = PUREED DIET  Do for the first FEW DAYS AFTER LEAVING THE HOSPITAL  -Foods in this group are pureed or blenderized to a smooth, mashed potato-like consistency.  -If necessary, the pureed foods can keep their shape with the addition of a thickening agent.   -Meat should be pureed to a smooth, pasty consistency.  Hot broth or  gravy may be added to the pureed meat, approximately 1 oz. of liquid per 3 oz. serving of meat. -CAUTION:  If any foods do not puree into a smooth consistency, swallowing will be more difficult.  (For example, nuts or seeds sometimes do not blend well.)  Hot Foods Cold Foods  Pureed scrambled eggs and cheese Pureed cottage cheese  Baby cereals Thickened juices and nectars  Thinned cooked cereals (no lumps) Thickened milk or eggnog  Pureed French toast or pancakes Ensure  Mashed potatoes Ice cream  Pureed parsley, au gratin, scalloped potatoes, candied sweet potatoes Fruit or Italian ice, sherbet  Pureed buttered or alfredo noodles Plain yogurt  Pureed vegetables (no corn or peas) Instant breakfast  Pureed soups and creamed soups Smooth pudding, mousse, custard  Pureed scalloped apples Whipped gelatin  Gravies Sugar, syrup, honey, jelly  Sauces, cheese, tomato, barbecue, white, creamed Cream  Any baby food Creamer  Alcohol in moderation (not beer or champagne) Margarine  Coffee or tea Mayonnaise   Ketchup, mustard   Apple sauce   SAMPLE MENU:  PUREED DIET Breakfast Lunch Dinner  Orange juice, 1/2 cup Cream of wheat, 1/2 cup Pineapple juice, 1/2 cup Pureed turkey, barley soup, 3/4 cup Pureed Hawaiian chicken, 3 oz  Scrambled eggs, mashed or blended with cheese, 1/2 cup Tea or coffee, 1 cup  Whole milk, 1 cup  Non-dairy creamer, 2 Tbsp. Mashed potatoes, 1/2 cup Pureed cooled broccoli, 1/2 cup Apple sauce,  1/2 cup Coffee or tea Mashed potatoes, 1/2 cup Pureed spinach, 1/2 cup Frozen yogurt, 1/2 cup Tea or coffee      LEVEL 2 = SOFT DIET  After your first few days, you can advance to a soft, low residue diet.   Keep on this diet until everything goes down easily.  Hot Foods Cold Foods  White fish Cottage cheese  Stuffed fish Junior baby fruit  Baby food meals Semi thickened juices  Minced soft cooked, scrambled, poached eggs nectars  Souffle & omelets Ripe mashed bananas   Cooked cereals Canned fruit, pineapple sauce, milk  potatoes Milkshake  Buttered or Alfredo noodles Custard  Cooked cooled vegetable Puddings, including tapioca  Sherbet Yogurt  Vegetable soup or alphabet soup Fruit ice, Italian ice  Gravies Whipped gelatin  Sugar, syrup, honey, jelly Junior baby desserts  Sauces:  Cheese, creamed, barbecue, tomato, white Cream  Coffee or tea Margarine   SAMPLE MENU:  LEVEL 2 Breakfast Lunch Dinner  Orange juice, 1/2 cup Oatmeal, 1/2 cup Scrambled eggs with cheese, 1/2 cup Decaffeinated tea, 1 cup Whole milk, 1 cup Non-dairy creamer, 2 Tbsp Pineapple juice, 1/2 cup Minced beef, 3 oz Gravy, 2 Tbsp Mashed potatoes, 1/2 cup Minced fresh broccoli, 1/2 cup Applesauce, 1/2 cup Coffee, 1 cup Turkey, barley soup, 3/4 cup Minced Hawaiian chicken, 3 oz Mashed potatoes, 1/2 cup Cooked spinach, 1/2 cup Frozen yogurt, 1/2 cup Non-dairy creamer, 2 Tbsp      LEVEL 3 = CHOPPED DIET  -After all the foods in level 2 (soft diet) are passing through well you should advance up to more chopped foods.  -It is still important to cut these foods into small pieces and eat slowly.  Hot Foods Cold Foods  Poultry Cottage cheese  Chopped Swedish meatballs Yogurt  Meat salads (ground or flaked meat) Milk  Flaked fish (tuna) Milkshakes  Poached or scrambled eggs Soft, cold, dry cereal  Souffles and omelets Fruit juices or nectars  Cooked cereals Chopped canned fruit  Chopped French toast or pancakes Canned fruit cocktail  Noodles or pasta (no rice) Pudding, mousse, custard  Cooked vegetables (no frozen peas, corn, or mixed vegetables) Green salad  Canned small sweet peas Ice cream  Creamed soup or vegetable soup Fruit ice, Italian ice  Pureed vegetable soup or alphabet soup Non-dairy creamer  Ground scalloped apples Margarine  Gravies Mayonnaise  Sauces:  Cheese, creamed, barbecue, tomato, white Ketchup  Coffee or tea Mustard   SAMPLE MENU:  LEVEL  3 Breakfast Lunch Dinner  Orange juice, 1/2 cup Oatmeal, 1/2 cup Scrambled eggs with cheese, 1/2 cup Decaffeinated tea, 1 cup Whole milk, 1 cup Non-dairy creamer, 2 Tbsp Ketchup, 1 Tbsp Margarine, 1 tsp Salt, 1/4 tsp Sugar, 2 tsp Pineapple juice, 1/2 cup Ground beef, 3 oz Gravy, 2 Tbsp Mashed potatoes, 1/2 cup Cooked spinach, 1/2 cup Applesauce, 1/2 cup Decaffeinated coffee Whole milk Non-dairy creamer, 2 Tbsp Margarine, 1 tsp Salt, 1/4 tsp Pureed turkey, barley soup, 3/4 cup Barbecue chicken, 3 oz Mashed potatoes, 1/2 cup Ground fresh broccoli, 1/2 cup Frozen yogurt, 1/2 cup Decaffeinated tea, 1 cup Non-dairy creamer, 2 Tbsp Margarine, 1 tsp Salt, 1/4 tsp Sugar, 1 tsp    LEVEL 4:  HIGH FIBER DIET / REGULAR FOODS  -Foods in this group are soft, moist, regularly textured foods.   -This level includes meat and breads, which tend to be the hardest things to swallow.   -Eat very slowly, chew well and continue to avoid carbonated drinks. -most  people are at this level in 2-4 weeks  Hot Foods Cold Foods  Baked fish or skinned Soft cheeses - cottage cheese  Souffles and omelets Cream cheese  Eggs Yogurt  Stuffed shells Milk  Spaghetti with meat sauce Milkshakes  Cooked cereal Cold dry cereals (no nuts, dried fruit, coconut)  French toast or pancakes Crackers  Buttered toast Fruit juices or nectars  Noodles or pasta (no rice) Canned fruit  Potatoes (all types) Ripe bananas  Soft, cooked vegetables (no corn, lima, or baked beans) Peeled, ripe, fresh fruit  Creamed soups or vegetable soup Cakes (no nuts, dried fruit, coconut)  Canned chicken noodle soup Plain doughnuts  Gravies Ice cream  Bacon dressing Pudding, mousse, custard  Sauces:  Cheese, creamed, barbecue, tomato, white Fruit ice, Italian ice, sherbet  Decaffeinated tea or coffee Whipped gelatin  Pork chops Regular gelatin   Canned fruited gelatin molds   Sugar, syrup, honey, jam, jelly   Cream   Non-dairy    Margarine   Oil   Mayonnaise   Ketchup   Mustard   TROUBLESHOOTING IRREGULAR BOWELS  1) Avoid extremes of bowel movements (no bad constipation/diarrhea)  2) Miralax  17gm mixed in 8oz. water  or juice-daily. May use BID as needed.  3) Gas-x,Phazyme, etc. as needed for gas & bloating.  4) Soft,bland diet. No spicy,greasy,fried foods.  5) Prilosec over-the-counter as needed  6) May hold gluten/wheat products from diet to see if symptoms improve.  7) May try probiotics (Align, Activa, etc) to help calm the bowels down  7) If symptoms become worse call back immediately.    If you have any questions please call our office at [facility]: [phone].     This information is not intended to replace advice given to you by your health care provider. Make sure you discuss any questions you have with your health care provider.  Diverticulitis  Diverticulitis is infection or inflammation of small pouches (diverticula) in the colon that form due to a condition called diverticulosis. Diverticula can trap stool (feces) and bacteria, causing infection and inflammation. Diverticulitis may cause severe stomach pain and diarrhea. It may lead to tissue damage in the colon that causes bleeding. The diverticula may also burst (rupture) and cause infected stool to enter other areas of the abdomen. Complications of diverticulitis can include: Bleeding. Severe infection. Severe pain. Rupture (perforation) of the colon. Blockage (obstruction) of the colon. What are the causes? This condition is caused by stool becoming trapped in the diverticula, which allows bacteria to grow in the diverticula. This leads to inflammation and infection. What increases the risk? You are more likely to develop this condition if: You have diverticulosis. The risk for diverticulosis increases if: You are overweight or obese. You use tobacco products. You do not get enough exercise. You eat a diet that  does not include enough fiber. High-fiber foods include fruits, vegetables, beans, nuts, and whole grains. What are the signs or symptoms? Symptoms of this condition may include: Pain and tenderness in the abdomen. The pain is normally located on the left side of the abdomen, but it may occur in other areas. Fever and chills. Bloating. Cramping. Nausea. Vomiting. Changes in bowel routines. Blood in your stool. How is this diagnosed? This condition is diagnosed based on: Your medical history. A physical exam. Tests to make sure there is nothing else causing your condition. These tests may include: Blood tests. Urine tests. Imaging tests of the abdomen, including X-rays, ultrasounds, MRIs, or CT scans. How  is this treated? Most cases of this condition are mild and can be treated at home. Treatment may include: Taking over-the-counter pain medicines. Following a clear liquid diet. Taking antibiotic medicines by mouth. Rest. More severe cases may need to be treated at a hospital. Treatment may include: Not eating or drinking. Taking prescription pain medicine. Receiving antibiotic medicines through an IV tube. Receiving fluids and nutrition through an IV tube. Surgery. When your condition is under control, your health care provider may recommend that you have a colonoscopy. This is an exam to look at the entire large intestine. During the exam, a lubricated, bendable tube is inserted into the anus and then passed into the rectum, colon, and other parts of the large intestine. A colonoscopy can show how severe your diverticula are and whether something else may be causing your symptoms. Follow these instructions at home: Medicines Take over-the-counter and prescription medicines only as told by your health care provider. These include fiber supplements, probiotics, and stool softeners. If you were prescribed an antibiotic medicine, take it as told by your health care provider. Do not  stop taking the antibiotic even if you start to feel better. Do not drive or use heavy machinery while taking prescription pain medicine. General instructions  We recommend that you eat a diet that contains at least 25 g (25 grams) of fiber daily. Fiber makes it easier to pass stool. Healthy sources of fiber include: Berries. One cup contains 4-8 grams of fiber. Beans or lentils. One half cup contains 5-8 grams of fiber. Green vegetables. One cup contains 4 grams of fiber. Exercise for at least 30 minutes, 3 times each week. You should exercise hard enough to raise your heart rate and break a sweat. Keep all follow-up visits as told by your health care provider. This is important. You may need a colonoscopy. Contact a health care provider if: Your pain does not improve. You have a hard time drinking or eating food. Your bowel movements do not return to normal. Get help right away if: Your pain gets worse. Your symptoms do not get better with treatment. Your symptoms suddenly get worse. You have a fever. You vomit more than one time. You have stools that are bloody, black, or tarry. Summary Diverticulitis is infection or inflammation of small pouches (diverticula) in the colon that form due to a condition called diverticulosis. Diverticula can trap stool (feces) and bacteria, causing infection and inflammation. You are at higher risk for this condition if you have diverticulosis and you eat a diet that does not include enough fiber. Most cases of this condition are mild and can be treated at home. More severe cases may need to be treated at a hospital. When your condition is under control, your health care provider may recommend that you have an exam called a colonoscopy. This exam can show how severe your diverticula are and whether something else may be causing your symptoms. This information is not intended to replace advice given to you by your health care provider. Make sure you discuss  any questions you have with your health care provider. Document Revised: 09/16/2017 Document Reviewed: 11/06/2016 Elsevier Patient Education  2020 [employer]        SURGERY: POST OP INSTRUCTIONS (Surgery for small bowel obstruction, colon resection, etc)   ######################################################################  EAT High fiber diet with a fiber supplement every day  WALK Walk an hour a day.  Control your pain to do that.    CONTROL PAIN Control pain  so that you can walk, sleep, tolerate sneezing/coughing, go up/down stairs.  HAVE A BOWEL MOVEMENT DAILY Keep your bowels regular to avoid problems.  OK to try a laxative to override constipation.  OK to use an antidairrheal to slow down diarrhea.  Call if not better after 2 tries  CALL IF YOU HAVE PROBLEMS/CONCERNS Call if you are still struggling despite following these instructions. Call if you have concerns not answered by these instructions  ######################################################################   DIET  Add a daily fiber supplement to your diet.  Eat a regular solid diet.  Avoid fast food or heavy meals the first week as you are more likely to get nauseated. It is expected for your digestive tract to need a few months to get back to normal.  It is common for your bowel movements and stools to be irregular.  You will have occasional bloating and cramping that should eventually fade away.  Until you are eating solid food normally, off all pain medications, and back to regular activities; your bowels will not be normal. Focus on eating a low-fat, high fiber diet the rest of your life (See Getting to Good Bowel Health, below).  CARE of your INCISION or WOUND  It is good for incisions and even open wounds to be washed every day.  Shower every day.  Short baths are fine.  Wash the incisions and wounds clean with soap & water .   Pack wound daily (see instructions below)  ACTIVITIES as  tolerated Start light daily activities --- self-care, walking, climbing stairs-- beginning the day after surgery.  Gradually increase activities as tolerated.  Control your pain to be active.  Stop when you are tired.  Ideally, walk several times a day, eventually an hour a day.   Most people are back to most day-to-day activities in a few weeks.  It takes 4-8 weeks to get back to unrestricted, intense activity. If you can walk 30 minutes without difficulty, it is safe to try more intense activity such as jogging, treadmill, bicycling, low-impact aerobics, swimming, etc. Save the most intensive and strenuous activity for last (Usually 4-8 weeks after surgery) such as sit-ups, heavy lifting, contact sports, etc.  Refrain from any intense heavy lifting or straining until you are off narcotics for pain control.  You will have off days, but things should improve week-by-week. DO NOT PUSH THROUGH PAIN.  Let pain be your guide: If it hurts to do something, don't do it.  Pain is your body warning you to avoid that activity for another week until the pain goes down. You may drive when you are no longer taking narcotic prescription pain medication, you can comfortably wear a seatbelt, and you can safely make sudden turns/stops to protect yourself without hesitating due to pain. You may have sexual intercourse when it is comfortable. If it hurts to do something, stop.   MEDICATIONS Take your usually prescribed home medications unless otherwise directed.    Blood thinners:  You can restart any strong blood thinners after the second postoperative day  for example: COUMADIN (warfarin), XERELTO (rivaroxaban), ELIQUIS (apixaban), PLAVIX (clopidigrel), BRILINTA (ticagrelor), EFFIENT (prasugrel), PRADAXA (dabigatran), etc  Continue aspirin before & after surgery..     Some oozing/bleeding the first 1-2 weeks is common but should taper down & be small volume.    If you are passing many large clots or having  uncontrolling bleeding, call your surgeon    PAIN CONTROL Pain after surgery or related to activity is often due to  strain/injury to muscle, tendon, nerves and/or incisions.  This pain is usually short-term and will improve in a few months.  To help speed the process of healing and to get back to regular activity more quickly, DO THE FOLLOWING THINGS TOGETHER: Increase activity gradually.  DO NOT PUSH THROUGH PAIN Use Ice and/or Heat Try Gentle Massage and/or Stretching Take over the counter pain medication Take Narcotic prescription pain medication for more severe pain  Good pain control = faster recovery.  It is better to take more medicine to be more active than to stay in bed all day to avoid medications.  Increase activity gradually Avoid heavy lifting at first, then increase to lifting as tolerated over the next 6 weeks. Do not push through the pain.  Listen to your body and avoid positions and maneuvers than reproduce the pain.  Wait a few days before trying something more intense Walking an hour a day is encouraged to help your body recover faster and more safely.  Start slowly and stop when getting sore.  If you can walk 30 minutes without stopping or pain, you can try more intense activity (running, jogging, aerobics, cycling, swimming, treadmill, sex, sports, weightlifting, etc.) Remember: If it hurts to do it, then dont do it! Use Ice and/or Heat You will have swelling and bruising around the incisions.  This will take several weeks to resolve. Ice packs or heating pads (6-8 times a day, 30-60 minutes at a time) will help sooth soreness & bruising. Some people prefer to use ice alone, heat alone, or alternate between ice & heat.  Experiment and see what works best for you.  Consider trying ice for the first few days to help decrease swelling and bruising; then, switch to heat to help relax sore spots and speed recovery. Shower every day.  Short baths are fine.  It feels good!   Keep the incisions and wounds clean with soap & water .   Try Gentle Massage and/or Stretching Massage at the area of pain many times a day Stop if you feel pain - do not overdo it Take over the counter pain medication This helps the muscle and nerve tissues become less irritable and calm down faster Choose ONE of the following over-the-counter anti-inflammatory medications: Acetaminophen  500mg  tabs (Tylenol ) 1-2 pills with every meal and just before bedtime (avoid if you have liver problems or if you have acetaminophen  in you narcotic prescription) Naproxen 220mg  tabs (ex. Aleve, Naprosyn) 1-2 pills twice a day (avoid if you have kidney, stomach, IBD, or bleeding problems) Ibuprofen  200mg  tabs (ex. Advil , Motrin ) 3-4 pills with every meal and just before bedtime (avoid if you have kidney, stomach, IBD, or bleeding problems) Take with food/snack several times a day as directed for at least 2 weeks to help keep pain / soreness down & more manageable. Take Narcotic prescription pain medication for more severe pain A prescription for strong pain control is often given to you upon discharge (for example: oxycodone /Percocet, hydrocodone/Norco/Vicodin, or tramadol/Ultram) Take your pain medication as prescribed. Be mindful that most narcotic prescriptions contain Tylenol  (acetaminophen ) as well - avoid taking too much Tylenol . If you are having problems/concerns with the prescription medicine (does not control pain, nausea, vomiting, rash, itching, etc.), please call us  [phone] to see if we need to switch you to a different pain medicine that will work better for you and/or control your side effects better. If you need a refill on your pain medication, you must call the office before 4  pm and on weekdays only.  By federal law, prescriptions for narcotics cannot be called into a pharmacy.  They must be filled out on paper & picked up from our office by the patient or authorized caretaker.   Prescriptions cannot be filled after 4 pm nor on weekends.    WHEN TO CALL US  [phone] Severe uncontrolled or worsening pain  Fever over 101 F (38.5 C) Concerns with the incision: Worsening pain, redness, rash/hives, swelling, bleeding, or drainage Reactions / problems with new medications (itching, rash, hives, nausea, etc.) Nausea and/or vomiting Difficulty urinating Difficulty breathing Worsening fatigue, dizziness, lightheadedness, blurred vision Other concerns If you are not getting better after two weeks or are noticing you are getting worse, contact our office [phone] for further advice.  We may need to adjust your medications, re-evaluate you in the office, send you to the emergency room, or see what other things we can do to help. The clinic staff is available to answer your questions during regular business hours (8:30am-5pm).  Please dont hesitate to call and ask to speak to one of our nurses for clinical concerns.    A surgeon from [facility] is always on call at the hospitals 24 hours/day If you have a medical emergency, go to the nearest emergency room or call 911.  FOLLOW UP in our office One the day of your discharge from the hospital (or the next business weekday), please call [facility] to set up or confirm an appointment to see your surgeon in the office for a follow-up appointment.  Usually it is 2-3 weeks after your surgery.   If you have skin staples at your incision(s), let the office know so we can set up a time in the office for the nurse to remove them (usually around 10 days after surgery). Make sure that you call for appointments the day of discharge (or the next business weekday) from the hospital to ensure a convenient appointment time. IF YOU HAVE DISABILITY OR FAMILY LEAVE FORMS, BRING THEM TO THE OFFICE FOR PROCESSING.  DO NOT GIVE THEM TO YOUR DOCTOR.  [facility] [address],  [place], [place]  [address] ? [phone] - Main [phone] - Toll Free,  [phone] - Fax [email or web address]    GETTING TO GOOD BOWEL HEALTH. It is expected for your digestive tract to need a few months to get back to normal.  It is common for your bowel movements and stools to be irregular.  You will have occasional bloating and cramping that should eventually fade away.  Until you are eating solid food normally, off all pain medications, and back to regular activities; your bowels will not be normal.   Avoiding constipation The goal: ONE SOFT BOWEL MOVEMENT A DAY!    Drink plenty of fluids.  Choose water  first. TAKE A FIBER SUPPLEMENT EVERY DAY THE REST OF YOUR LIFE During your first week back home, gradually add back a fiber supplement every day Experiment which form you can tolerate.   There are many forms such as powders, tablets, wafers, gummies, etc Psyllium bran (Metamucil), methylcellulose (Citrucel), Miralax  or Glycolax , Benefiber, Flax Seed.  Adjust the dose week-by-week (1/2 dose/day to 6 doses a day) until you are moving your bowels 1-2 times a day.  Cut back the dose or try a different fiber product if it is giving you problems such as diarrhea or bloating. Sometimes a laxative is needed to help jump-start bowels if  constipated until the fiber supplement can help regulate your bowels.  If you are tolerating eating & you are farting, it is okay to try a gentle laxative such as double dose MiraLax , prune juice, or Milk of Magnesia.  Avoid using laxatives too often. Stool softeners can sometimes help counteract the constipating effects of narcotic pain medicines.  It can also cause diarrhea, so avoid using for too long. If you are still constipated despite taking fiber daily, eating solids, and a few doses of laxatives, call our office. Controlling diarrhea Try drinking liquids and eating bland foods for a few days to avoid stressing your intestines further. Avoid  dairy products (especially milk & ice cream) for a short time.  The intestines often can lose the ability to digest lactose when stressed. Avoid foods that cause gassiness or bloating.  Typical foods include beans and other legumes, cabbage, broccoli, and dairy foods.  Avoid greasy, spicy, fast foods.  Every person has some sensitivity to other foods, so listen to your body and avoid those foods that trigger problems for you. Probiotics (such as active yogurt, Align, etc) may help repopulate the intestines and colon with normal bacteria and calm down a sensitive digestive tract Adding a fiber supplement gradually can help thicken stools by absorbing excess fluid and retrain the intestines to act more normally.  Slowly increase the dose over a few weeks.  Too much fiber too soon can backfire and cause cramping & bloating. It is okay to try and slow down diarrhea with a few doses of antidiarrheal medicines.   Bismuth subsalicylate (ex. Kayopectate, Pepto Bismol) for a few doses can help control diarrhea.  Avoid if pregnant.   Loperamide  (Imodium ) can slow down diarrhea.  Start with one tablet (2mg ) first.  Avoid if you are having fevers or severe pain.  ILEOSTOMY PATIENTS WILL HAVE CHRONIC DIARRHEA since their colon is not in use.    Drink plenty of liquids.  You will need to drink even more glasses of water /liquid a day to avoid getting dehydrated. Record output from your ileostomy.  Expect to empty the bag every 3-4 hours at first.  Most people with a permanent ileostomy empty their bag 4-6 times at the least.   Use antidiarrheal medicine (especially Imodium ) several times a day to avoid getting dehydrated.  Start with a dose at bedtime & breakfast.  Adjust up or down as needed.  Increase antidiarrheal medications as directed to avoid emptying the bag more than 8 times a day (every 3 hours). Work with your wound ostomy nurse to learn care for your ostomy.  See ostomy care instructions. TROUBLESHOOTING  IRREGULAR BOWELS 1) Start with a soft & bland diet. No spicy, greasy, or fried foods.  2) Avoid gluten/wheat or dairy products from diet to see if symptoms improve. 3) Miralax  17gm or flax seed mixed in 8oz. water  or juice-daily. May use 2-4 times a day as needed. 4) Gas-X, Phazyme, etc. as needed for gas & bloating.  5) Prilosec (omeprazole) over-the-counter as needed 6)  Consider probiotics (Align, Activa, etc) to help calm the bowels down  Call your doctor if you are getting worse or not getting better.  Sometimes further testing (cultures, endoscopy, X-ray studies, CT scans, bloodwork, etc.) may be needed to help diagnose and treat the cause of the diarrhea. [facility] [address], [place], [place]  [address] [phone] - Main.    [phone]  - Toll Free.   [phone] -  Fax [email or web address]

## 2024-11-02 ENCOUNTER — Ambulatory Visit: Payer: Self-pay | Admitting: Surgery

## 2024-11-02 DIAGNOSIS — K56609 Unspecified intestinal obstruction, unspecified as to partial versus complete obstruction: Secondary | ICD-10-CM | POA: Diagnosis not present

## 2024-11-02 DIAGNOSIS — F3163 Bipolar disorder, current episode mixed, severe, without psychotic features: Secondary | ICD-10-CM | POA: Diagnosis not present

## 2024-11-02 DIAGNOSIS — E871 Hypo-osmolality and hyponatremia: Secondary | ICD-10-CM | POA: Diagnosis not present

## 2024-11-02 LAB — CBC
HCT: 24.7 % — ABNORMAL LOW (ref 36.0–46.0)
Hemoglobin: 8.5 g/dL — ABNORMAL LOW (ref 12.0–15.0)
MCH: 30.6 pg (ref 26.0–34.0)
MCHC: 34.4 g/dL (ref 30.0–36.0)
MCV: 88.8 fL (ref 80.0–100.0)
Platelets: 397 K/uL (ref 150–400)
RBC: 2.78 MIL/uL — ABNORMAL LOW (ref 3.87–5.11)
RDW: 14.4 % (ref 11.5–15.5)
WBC: 12.6 K/uL — ABNORMAL HIGH (ref 4.0–10.5)
nRBC: 0 % (ref 0.0–0.2)

## 2024-11-02 LAB — BASIC METABOLIC PANEL WITH GFR
Anion gap: 6 (ref 5–15)
BUN: 16 mg/dL (ref 8–23)
CO2: 29 mmol/L (ref 22–32)
Calcium: 7.8 mg/dL — ABNORMAL LOW (ref 8.9–10.3)
Chloride: 97 mmol/L — ABNORMAL LOW (ref 98–111)
Creatinine, Ser: 0.55 mg/dL (ref 0.44–1.00)
GFR, Estimated: >60 mL/min
Glucose, Bld: 93 mg/dL (ref 70–99)
Potassium: 3.8 mmol/L (ref 3.5–5.1)
Sodium: 131 mmol/L — ABNORMAL LOW (ref 135–145)

## 2024-11-02 LAB — CULTURE, BLOOD (ROUTINE X 2): Culture: NO GROWTH

## 2024-11-02 MED ORDER — HYDROMORPHONE HCL 1 MG/ML IJ SOLN
0.5000 mg | INTRAMUSCULAR | Status: DC | PRN
  Administered 2024-11-02: 1 mg via INTRAVENOUS
  Filled 2024-11-02: qty 1

## 2024-11-02 NOTE — Progress Notes (Signed)
Path benign. I told the pt the good news  

## 2024-11-02 NOTE — Progress Notes (Signed)
 Occupational Therapy Treatment Patient Details Name: Michelle Levy MRN: [id number] DOB: Jan 05, 1956 Today's Date: 11/02/2024   History of present illness Patient is a 69 yo female presenting to the ED with abdominal pain, nausea, and diarrhea on 10/28/24. CT showing large bowel obstruction. Laparoscopy, omentopexy of bladder fistula, serosal repair of cecum and cystoscopy completed on 1/13. PMH includes:  alcohol use disorder, bipolar disorder, diverticulitis with intra-abdominal abscesses   OT comments  Patient seen for skilled OT session. Educated on figure 4 strategies for lower body reach and son and patient on need for reacher for home use. Patient tolerated hallway ambulation s/p pain meds taken. Discharge recommendations remain appropriate. Patient requires continued Acute care hospital level OT services to progress safety and functional performance and allow for discharge.         If plan is discharge home, recommend the following:  A little help with walking and/or transfers;A little help with bathing/dressing/bathroom;Assistance with cooking/housework;Assist for transportation;Help with stairs or ramp for entrance   Equipment Recommendations  None recommended by OT       Precautions / Restrictions Precautions Precautions: Fall;Other (comment) Recall of Precautions/Restrictions: Intact Precaution/Restrictions Comments: NG tube clamped, abdominal wound with wound vac, JP drain, ostomy, foley Restrictions Weight Bearing Restrictions Per Provider Order: No       Mobility Bed Mobility Overal bed mobility: Needs Assistance Bed Mobility: Rolling, Sidelying to Sit, Sit to Sidelying Rolling: Supervision Sidelying to sit: Supervision     Sit to sidelying: Supervision General bed mobility comments: improved using bed features and no assist except for wound vac lines    Transfers Overall transfer level: Needs assistance Equipment used: Rolling walker (2 wheels) Transfers:  Sit to/from Stand, Bed to chair/wheelchair/BSC Sit to Stand: Contact guard assist     Step pivot transfers: Contact guard assist     General transfer comment: Cues for hand placement, support of IV pole with vac     Balance Overall balance assessment: Needs assistance Sitting-balance support: Feet supported, No upper extremity supported Sitting balance-Michelle Levy Scale: Good     Standing balance support: Bilateral upper extremity supported, During functional activity, No upper extremity supported Standing balance-Michelle Levy Scale: Fair Standing balance comment: using IV pole for steadying at times, may need AD                           ADL either performed or assessed with clinical judgement   ADL Overall ADL's : Needs assistance/impaired Eating/Feeding: Set up   Grooming: Wash/dry hands;Wash/dry face;Set up;Sitting           Upper Body Dressing : Set up;Sitting   Lower Body Dressing: Minimal assistance Lower Body Dressing Details (indicate cue type and reason): figure 4 for socks             Functional mobility during ADLs: Contact guard assist (IV pole) General ADL Comments: recommended reacher and son to assist to provide    Extremity/Trunk Assessment Upper Extremity Assessment Upper Extremity Assessment: Overall WFL for tasks assessed;Right hand dominant   Lower Extremity Assessment Lower Extremity Assessment: Defer to PT evaluation        Vision   Vision Assessment?: No apparent visual deficits   Perception Perception Perception: Not tested   Praxis Praxis Praxis: Not tested   Communication Communication Communication: No apparent difficulties   Cognition Arousal: Alert Behavior During Therapy: WFL for tasks assessed/performed Cognition: No apparent impairments  Following commands: Intact        Cueing   Cueing Techniques: Verbal cues        General Comments tolerated 150 ft amb in hallway  with mild fatigue and HR 102 max.    Pertinent Vitals/ Pain       Pain Assessment Pain Assessment: Faces Faces Pain Scale: Hurts a little bit Breathing: normal Negative Vocalization: none Facial Expression: smiling or inexpressive Body Language: relaxed Consolability: no need to console PAINAD Score: 0 Pain Location: abodominal pain Pain Descriptors / Indicators: Grimacing, Guarding, Discomfort Pain Intervention(s): Limited activity within patient's tolerance, Monitored during session, Premedicated before session, Repositioned   Frequency  Min 2X/week        Progress Toward Goals  OT Goals(current goals can now be found in the care plan section)  Progress towards OT goals: Progressing toward goals  Acute Rehab OT Goals Patient Stated Goal: to progress amb OT Goal Formulation: With patient Time For Goal Achievement: 11/14/24 Potential to Achieve Goals: Good  Plan         AM-PAC OT 6 Clicks Daily Activity     Outcome Measure   Help from another person eating meals?: A Little Help from another person taking care of personal grooming?: A Little Help from another person toileting, which includes using toliet, bedpan, or urinal?: A Little Help from another person bathing (including washing, rinsing, drying)?: A Little Help from another person to put on and taking off regular upper body clothing?: A Little Help from another person to put on and taking off regular lower body clothing?: A Little 6 Click Score: 18    End of Session Equipment Utilized During Treatment: Gait belt (IV pole)  OT Visit Diagnosis: Muscle weakness (generalized) (M62.81);Other abnormalities of gait and mobility (R26.89);Unsteadiness on feet (R26.81);Pain Pain - part of body:  (abdomen)   Activity Tolerance Patient tolerated treatment well   Patient Left in bed;with bed alarm set;with call bell/phone within reach;with family/visitor present   Nurse Communication Mobility status;Patient  requests pain meds        Time: 1600-1630 OT Time Calculation (min): 30 min  Charges: OT General Charges $OT Visit: 1 Visit OT Treatments $Self Care/Home Management : 8-22 mins $Therapeutic Activity: 8-22 mins  Myleka Moncure OT/L Acute Rehabilitation Department  [phone]  11/02/2024, 4:46 PM

## 2024-11-02 NOTE — Care Management Important Message (Signed)
 Important Message  Patient Details IM Letter given. Name: Michelle Levy MRN: [id number] Date of Birth: 08/15/1956   Important Message Given:  Yes - Medicare IM     Jordi Kamm 11/02/2024, 3:45 PM

## 2024-11-02 NOTE — Progress Notes (Signed)
 " Progress Note   Patient: Michelle Levy FMW:[id number] DOB: 1956-06-06 DOA: 10/28/2024     5 DOS: the patient was seen and examined on 11/02/2024   Brief hospital course: Michelle Levy is a 69 y.o. female with history of bipolar disorder who was recently admitted last month from 10/02/2024 through 10/05/2024 for diverticulitis with abscess managed conservatively with antibiotics states that since Wednesday about 4 days ago patient has been having abdominal discomfort mostly in the left lower quadrant.  In the ER CT showed features concerning for large bowel obstruction and colovesical fistula.  Laboratory findings showed sodium of 123, white count 12.6, abnormal UA.  General surgery consulted.  Patient is started on empiric antibiotics, NG tube decompression admitted to TRH service for further management evaluation.   Surgery team performed [facility] procedure, partial colectomy with colostomy, bladder fistula repair 10/30/2024.  NG tube removed  Assessment and Plan: Large bowel obstruction Colovesical fistula- Sigmoid diverticulitis with abscess causing obstruction- S/p Hartmann procedure, partial colectomy with end colostomy, omentopexy of bladder fistula, serosal repair of cecum, placement of pelvic drain. Path reviewed shows: Sigmoid stricture, diverticulosis and diverticulitis. Continue Zosyn  therapy. NG tube removed, diet advanced to soft. Good ostomy output. General surgery follow-up appreciated. Wound management per surgery team.   Hyponatremia- Chronic, baseline sodium 127-130. Sodium improved. Trend sodium.  Hypokalemia- Improved with supplementation. Continue to recheck electrolytes and replete accordingly.  Acute on chronic anemia- Post op anemia. Continue to trend.  Hypermagnesemia: Improved. Hold home mag citrate supplementation.  Bipolar disorder- Resumed Depakote  from tonight..     Out of bed to chair. Incentive spirometry. Nursing supportive care. Fall,  aspiration precautions. Diet:  Diet Orders (From admission, onward)     Start     Ordered   11/02/24 1220  Diet regular Room service appropriate? Yes; Fluid consistency: Thin  Diet effective now       Comments: If patient experiences nausea, vomiting, worsening pain, or worsening distention; then, make NPO with sips/ice chips only until seen by MD  Question Answer Comment  Room service appropriate? Yes   Fluid consistency: Thin      11/02/24 1219           DVT prophylaxis: SCDs Start: 10/28/24 2132  Level of care: Telemetry   Code Status: Full Code  Subjective: Patient is seen and examined today morning.  She is lying comfortably, eating fair. Getting out of bed. States she will need education regarding wound care, ostomy.Michelle Levy   Physical Exam: Vitals:   11/01/24 1400 11/01/24 1947 11/02/24 0414 11/02/24 1453  BP: 106/67 (!) 104/54 105/63 110/60  Pulse: 88 87 82 83  Resp: 14 17 16 18   Temp: 98.2 F (36.8 C) 98.2 F (36.8 C) 97.8 F (36.6 C) 98.1 F (36.7 C)  TempSrc: Oral Oral Oral Oral  SpO2:      Weight:      Height:        General - Elderly Caucasian thin built female, no apparent distress HEENT - PERRLA, EOMI, atraumatic head. Lung - Clear, basal rales, rhonchi, no wheezes. Heart - S1, S2 heard, no murmurs, rubs, trace pedal edema. Abdomen - Soft, mild diffuse tender, colostomy, drain noted. Neuro - Alert, awake and oriented x 3, non focal exam. Skin - Warm and dry.  Data Reviewed:      Latest Ref Rng & Units 11/02/2024    6:15 AM 11/01/2024    8:27 AM 11/01/2024    6:22 AM  CBC  WBC 4.0 -  10.5 K/uL 12.6  14.4  12.2   Hemoglobin 12.0 - 15.0 g/dL 8.5  89.9  9.3   Hematocrit 36.0 - 46.0 % 24.7  29.2  27.2   Platelets 150 - 400 K/uL 397  437  420       Latest Ref Rng & Units 11/02/2024    6:15 AM 11/01/2024    5:53 PM 11/01/2024    6:22 AM  BMP  Glucose 70 - 99 mg/dL 93  864  94   BUN 8 - 23 mg/dL 16  19  17    Creatinine 0.44 - 1.00 mg/dL 9.44  9.33   9.25   Sodium 135 - 145 mmol/L 131  130  132   Potassium 3.5 - 5.1 mmol/L 3.8  4.7  4.0   Chloride 98 - 111 mmol/L 97  96  98   CO2 22 - 32 mmol/L 29  28  27    Calcium  8.9 - 10.3 mg/dL 7.8  7.8  8.0    No results found.   Family Communication: Discussed with patient, she understand and agree. All questions answered.  Disposition: Status is: Inpatient Remains inpatient appropriate because: S/p Hartman surgery, IV antibiotics, advance diet, surgery team follow-up  Planned Discharge Destination: Home with Home Health     Time spent: 44 minutes  Author: Concepcion Riser, MD 11/02/2024 6:08 PM Secure chat 7am to 7pm For on call review [email or web address].    "

## 2024-11-02 NOTE — Plan of Care (Signed)
" °  Problem: Education: Goal: Understanding of discharge needs will improve Outcome: Progressing   Problem: Health Behavior/Discharge Planning: Goal: Identification of community resources to assist with postoperative recovery needs will improve Outcome: Progressing   Problem: Respiratory: Goal: Respiratory status will improve Outcome: Progressing   Problem: Education: Goal: Knowledge of the prescribed therapeutic regimen will improve Outcome: Progressing   Problem: Bowel/Gastric: Goal: Gastrointestinal status for postoperative course will improve Outcome: Progressing   Problem: Nutritional: Goal: Will attain and maintain optimal nutritional status Outcome: Progressing   Problem: Skin Integrity: Goal: Demonstrates signs of wound healing without infection Outcome: Progressing   "

## 2024-11-02 NOTE — Progress Notes (Signed)
 Nutrition Education Note  RD consulted for nutrition education regarding Nutrition Management for Diverticulosis/Diverticulitis.  RD provided both Low Fiber Nutrition Therapy and High Fiber Nutrition Therapy handout from the Academy of [employer]. Reviewed home diet with pt and suggested ways to meet nutrition goals over the next several weeks. Explained reasons to follow Low Fiber diet until told to advance by surgeon and discussed ways to achieve. Discussed best practice for long-term management of diverticulosis is a High Fiber diet and discussed ways to increase fiber content in an appropriate time frame per her doctor. Encouraged fresh fruits and vegetables as well as whole grain sources of carbohydrates to maximize fiber intake. Encouraged fluid intake. Discussed symptom management should abdominal pain occur while on high fiber diet.  Pt verbalizes understanding of information provided. Expect fair compliance.  Body mass index is 21.95   Current diet order is Regular. Surgery has ordered Ensure Surgery BID which patient endorses liking and consuming.  Labs and medications reviewed. No further nutrition interventions warranted at this time. If additional nutrition issues arise, please re-consult RD.  Trude Ned RD, LDN Contact via [employer].

## 2024-11-02 NOTE — Progress Notes (Addendum)
 11/02/2024  Michelle Levy [id number] 01-06-1956  CARE TEAM: PCP: Aisha Harvey, MD  Outpatient Care Team: Patient Care Team: Aisha Harvey, MD as PCP - General (Family Medicine)  Inpatient Treatment Team: Treatment Team:  Darci Pore, MD Ccs, Md, MD Sherree Stephane KIDD, RN Bobbette Mattocks, MD Osie Iantha SQUIBB, [facility] Herlene Isidor [place], LPN Apickup-Pt, A, PT Duwaine Jenkins DASEN, RN Sonda Manuella MARLA, RN   Problem List:   Principal Problem:   Bowel obstruction (HCC) Active Problems:   Bipolar disorder, curr episode mixed, severe, w/o psychotic features (HCC)   Abscess of sigmoid colon due to diverticulitis   Hyponatremia   Hypokalemia   Hypermagnesemia   Colovesical fistula   10/30/2024  Procedures: CYSTOSCOPY WITH INDOCYANINE GREEN  IMAGING (ICG) DIAGNOSTIC LAPAROSCOPY, OMENTOPEXY OF BLADDER FISTULA, AND SEROSAL REPAIR OF CECUM HARTMANN PROCEDURE    Assessment [facility] Stay = 5 days) 3 Days Post-Op     - Overall patient is recovering relatively well.   Plan:    - Modify diet from soft to regular diet. - Okay to D/C Telemetry from a surgical perspective.  - Monitor electrolytes & replace as needed  - Hyponatremia resolving continue to monitor Keep K>4, Mg>2, Phos>3  -VTE prophylaxis- SCDs.  Anticoagulation prophyllaxis SQ as appropriate  - Mobilize as tolerated to help recovery.  Enlist therapies in moderate/high risk patients as appropriate  I updated the patient's status to the patient  Recommendations were made.  Questions were answered.  She expressed understanding & appreciation.  -Disposition:  Disposition:  The patient is from: Home Anticipate discharge to:  Home Anticipated Date of Discharge is:  11/03/2024 Barriers to discharge:  Pending Clinical improvement (more likely than not)  Patient currently is NOT MEDICALLY STABLE for discharge from the hospital from a surgery standpoint.      I reviewed last 24 h vitals and pain scores, last 48  h intake and output, last 24 h labs and trends, and last 24 h imaging results.  I have reviewed this patient's available data, including medical history, events of note, test results, etc as part of my evaluation.   A significant portion of that time was spent in counseling. Care during the described time interval was provided by me.  This care required moderate level of medical decision making.  11/02/2024    Subjective: (Chief complaint)  Patient stated she is overall feeling well. Pain is well controlled with PRN pain meds.   She is tolerating a soft diet and denies nausea, vomiting, or difficulties urinating.  BM and flatus via ostomy bag.  She ambulates several times a day to the hall and chair.    Objective:  Vital signs:  Vitals:   11/01/24 0533 11/01/24 1400 11/01/24 1947 11/02/24 0414  BP: (!) 104/59 106/67 (!) 104/54 105/63  Pulse: 89 88 87 82  Resp: 14 14 17 16   Temp: 98.1 F (36.7 C) 98.2 F (36.8 C) 98.2 F (36.8 C) 97.8 F (36.6 C)  TempSrc: Oral Oral Oral Oral  SpO2: 96%     Weight:      Height:        Last BM Date : 10/26/24  Intake/Output   Yesterday:  01/15 0701 - 01/16 0700 In: 809.3 [P.O.:480; IV Piggyback:329.3] Out: 245 [Drains:145; Stool:100] This shift:  No intake/output data recorded.  Bowel function:  Flatus: YES  BM:  YES  Drain: Serosanguinous   Physical Exam:  General: Pt awake/alert in no acute distress Eyes: PERRL, normal EOM.  Sclera clear.  No icterus Neuro: CN II-XII intact w/o focal sensory/motor deficits. Lymph: No head/neck/groin lymphadenopathy Psych:  No delerium/psychosis/paranoia.  Oriented x 4 HENT: Normocephalic, Mucus membranes moist.  No thrush Neck: Supple, No tracheal deviation.  No obvious thyromegaly Chest: No pain to chest wall compression.  Good respiratory excursion.  No audible wheezing CV:  Pulses intact.  Regular rhythm.  No major extremity edema MS: Normal AROM mjr joints.  No obvious  deformity  Abdomen: Soft.  Nondistended.  Mildly tender at incisions only.  No evidence of peritonitis.  No incarcerated hernias.  Ext:   No deformity.  No mjr edema.  No cyanosis Skin: No petechiae / purpurea.  No major sores.  Warm and dry    Results:   Cultures: Recent Results (from the past 720 hours)  Blood culture (routine x 2)     Status: None   Collection Time: 10/28/24  4:01 PM   Specimen: BLOOD  Result Value Ref Range Status   Specimen Description   Final    BLOOD BLOOD RIGHT HAND Performed at [facility], [address] [place], [place] [address]    Special Requests   Final    BOTTLES DRAWN AEROBIC AND ANAEROBIC Blood Culture results may not be optimal due to an inadequate volume of blood received in culture bottles Performed at [facility], [address], [place], [place] [address]    Culture   Final    NO GROWTH 5 DAYS Performed at [facility], [address], [place], [place] [address]    Report Status 11/02/2024 FINAL  Final  Blood culture (routine x 2)     Status: None (Preliminary result)   Collection Time: 10/29/24  5:31 AM   Specimen: BLOOD LEFT ARM  Result Value Ref Range Status   Specimen Description   Final    BLOOD LEFT ARM Performed at [facility], [address], [place], [place] [address]    Special Requests   Final    BOTTLES DRAWN AEROBIC AND ANAEROBIC Blood Culture adequate volume Performed at [facility], [address] [place], [place] [address]    Culture   Final    NO GROWTH 4 DAYS Performed at [facility], [address], [place], [place] [address]    Report Status PENDING  Incomplete  Urine Culture     Status: None   Collection Time: 10/29/24  9:41 AM   Specimen: Urine, Random  Result Value Ref Range Status   Specimen Description   Final    URINE, RANDOM Performed at [facility], [address] [place], [place] [address]     Special Requests   Final    NONE Reflexed from [id number] Performed at [facility], [address], [place], [place] [address]    Culture   Final    NO GROWTH Performed at [facility], [address], [place], [place] [address]    Report Status 10/30/2024 FINAL  Final    Labs: Results for orders placed or performed during the hospital encounter of 10/28/24 (from the past 48 hours)  Basic metabolic panel with GFR     Status: Abnormal   Collection Time: 10/31/24  5:36 PM  Result Value Ref Range   Sodium 131 (L) 135 - 145 mmol/L   Potassium 4.0 3.5 - 5.1 mmol/L   Chloride 96 (L) 98 - 111 mmol/L   CO2 24 22 - 32 mmol/L  Glucose, Bld 234 (H) 70 - 99 mg/dL    Comment: Glucose reference range applies only to samples taken after fasting for at least 8 hours.   BUN 19 8 - 23 mg/dL   Creatinine, Ser 8.91 (H) 0.44 - 1.00 mg/dL   Calcium  8.2 (L) 8.9 - 10.3 mg/dL   GFR, Estimated 55 (L) >60 mL/min    Comment: (NOTE) Calculated using the CKD-EPI Creatinine Equation (2021)    Anion gap 11 5 - 15    Comment: Performed at [facility] [place], [address], [place], [place] [address]  Basic metabolic panel with GFR     Status: Abnormal   Collection Time: 11/01/24  6:22 AM  Result Value Ref Range   Sodium 132 (L) 135 - 145 mmol/L   Potassium 4.0 3.5 - 5.1 mmol/L   Chloride 98 98 - 111 mmol/L   CO2 27 22 - 32 mmol/L   Glucose, Bld 94 70 - 99 mg/dL    Comment: Glucose reference range applies only to samples taken after fasting for at least 8 hours.   BUN 17 8 - 23 mg/dL   Creatinine, Ser 9.25 0.44 - 1.00 mg/dL   Calcium  8.0 (L) 8.9 - 10.3 mg/dL   GFR, Estimated >39 >39 mL/min    Comment: (NOTE) Calculated using the CKD-EPI Creatinine Equation (2021)    Anion gap 7 5 - 15    Comment: Performed at [facility], [address] [place], [place] [address]  CBC     Status: Abnormal   Collection Time: 11/01/24  6:22 AM  Result Value Ref  Range   WBC 12.2 (H) 4.0 - 10.5 K/uL   RBC 3.05 (L) 3.87 - 5.11 MIL/uL   Hemoglobin 9.3 (L) 12.0 - 15.0 g/dL   HCT 72.7 (L) 63.9 - 53.9 %   MCV 89.2 80.0 - 100.0 fL   MCH 30.5 26.0 - 34.0 pg   MCHC 34.2 30.0 - 36.0 g/dL   RDW 85.2 88.4 - 84.4 %   Platelets 420 (H) 150 - 400 K/uL   nRBC 0.0 0.0 - 0.2 %    Comment: Performed at [facility], [address] [place], [place] [address]  CBC     Status: Abnormal   Collection Time: 11/01/24  8:27 AM  Result Value Ref Range   WBC 14.4 (H) 4.0 - 10.5 K/uL   RBC 3.26 (L) 3.87 - 5.11 MIL/uL   Hemoglobin 10.0 (L) 12.0 - 15.0 g/dL   HCT 70.7 (L) 63.9 - 53.9 %   MCV 89.6 80.0 - 100.0 fL   MCH 30.7 26.0 - 34.0 pg   MCHC 34.2 30.0 - 36.0 g/dL   RDW 85.3 88.4 - 84.4 %   Platelets 437 (H) 150 - 400 K/uL   nRBC 0.0 0.0 - 0.2 %    Comment: Performed at [facility], [address], [place], [place] [address]  Basic metabolic panel with GFR     Status: Abnormal   Collection Time: 11/01/24  5:53 PM  Result Value Ref Range   Sodium 130 (L) 135 - 145 mmol/L   Potassium 4.7 3.5 - 5.1 mmol/L    Comment: HEMOLYSIS AT THIS LEVEL MAY AFFECT RESULT   Chloride 96 (L) 98 - 111 mmol/L   CO2 28 22 - 32 mmol/L   Glucose, Bld 135 (H) 70 - 99 mg/dL    Comment: Glucose reference range applies only to samples taken after fasting for at  least 8 hours.   BUN 19 8 - 23 mg/dL   Creatinine, Ser 9.33 0.44 - 1.00 mg/dL   Calcium  7.8 (L) 8.9 - 10.3 mg/dL   GFR, Estimated >39 >39 mL/min    Comment: (NOTE) Calculated using the CKD-EPI Creatinine Equation (2021)    Anion gap 7 5 - 15    Comment: Performed at [facility], [address], [place], [place] [address]  Basic metabolic panel with GFR     Status: Abnormal   Collection Time: 11/02/24  6:15 AM  Result Value Ref Range   Sodium 131 (L) 135 - 145 mmol/L   Potassium 3.8 3.5 - 5.1 mmol/L    Comment: Delta check noted    Chloride 97 (L) 98 - 111 mmol/L    CO2 29 22 - 32 mmol/L   Glucose, Bld 93 70 - 99 mg/dL    Comment: Glucose reference range applies only to samples taken after fasting for at least 8 hours.   BUN 16 8 - 23 mg/dL   Creatinine, Ser 9.44 0.44 - 1.00 mg/dL   Calcium  7.8 (L) 8.9 - 10.3 mg/dL   GFR, Estimated >39 >39 mL/min    Comment: (NOTE) Calculated using the CKD-EPI Creatinine Equation (2021)    Anion gap 6 5 - 15    Comment: Performed at [facility], [address], [place], [place] [address]  CBC     Status: Abnormal   Collection Time: 11/02/24  6:15 AM  Result Value Ref Range   WBC 12.6 (H) 4.0 - 10.5 K/uL   RBC 2.78 (L) 3.87 - 5.11 MIL/uL   Hemoglobin 8.5 (L) 12.0 - 15.0 g/dL   HCT 75.2 (L) 63.9 - 53.9 %   MCV 88.8 80.0 - 100.0 fL   MCH 30.6 26.0 - 34.0 pg   MCHC 34.4 30.0 - 36.0 g/dL   RDW 85.5 88.4 - 84.4 %   Platelets 397 150 - 400 K/uL   nRBC 0.0 0.0 - 0.2 %    Comment: Performed at [facility], [address], [place], [place] [address]    Imaging / Studies: No results found.  Medications / Allergies: per chart  Antibiotics: Anti-infectives (From admission, onward)    Start     Dose/Rate Route Frequency Ordered Stop   10/30/24 1600  piperacillin -tazobactam (ZOSYN ) IVPB 3.375 g        3.375 g 12.5 mL/hr over 240 Minutes Intravenous Every 8 hours 10/30/24 1230 11/04/24 1559   10/30/24 0600  cefoTEtan  (CEFOTAN ) 2 g in sodium chloride  0.9 % 100 mL IVPB        2 g 200 mL/hr over 30 Minutes Intravenous On call to O.R. 10/29/24 1219 10/30/24 0817   10/29/24 1400  neomycin  (MYCIFRADIN ) tablet 1,000 mg       Placed in And Linked Group   1,000 mg Per Tube 3 times per day 10/29/24 1219 10/29/24 2211   10/29/24 1400  metroNIDAZOLE  (FLAGYL ) tablet 1,000 mg       Placed in And Linked Group   1,000 mg Per Tube 3 times per day 10/29/24 1219 10/29/24 2211   10/29/24 0000  piperacillin -tazobactam (ZOSYN ) IVPB 3.375 g  Status:  Discontinued        3.375 g 12.5 mL/hr  over 240 Minutes Intravenous Every 8 hours 10/28/24 2146 10/30/24 1230   10/28/24 1630  piperacillin -tazobactam (ZOSYN ) IVPB 3.375 g        3.375 g 100 mL/hr over 30 Minutes  Intravenous  Once 10/28/24 1624 10/28/24 1753   10/28/24 1600  cefTRIAXone  (ROCEPHIN ) 1 g in sodium chloride  0.9 % 100 mL IVPB  Status:  Discontinued        1 g 200 mL/hr over 30 Minutes Intravenous  Once 10/28/24 1550 10/28/24 1611         Note: Portions of this report may have been transcribed using voice recognition software. Every effort was made to ensure accuracy; however, inadvertent computerized transcription errors may be present.   Any transcriptional errors that result from this process are unintentional.   Lauraine Ronnald Nick PA - Student [facility]  11/02/2024  12:12 PM

## 2024-11-03 DIAGNOSIS — F3163 Bipolar disorder, current episode mixed, severe, without psychotic features: Secondary | ICD-10-CM | POA: Diagnosis not present

## 2024-11-03 DIAGNOSIS — K56609 Unspecified intestinal obstruction, unspecified as to partial versus complete obstruction: Secondary | ICD-10-CM | POA: Diagnosis not present

## 2024-11-03 DIAGNOSIS — E871 Hypo-osmolality and hyponatremia: Secondary | ICD-10-CM | POA: Diagnosis not present

## 2024-11-03 LAB — CBC
HCT: 26.1 % — ABNORMAL LOW (ref 36.0–46.0)
Hemoglobin: 8.9 g/dL — ABNORMAL LOW (ref 12.0–15.0)
MCH: 30.7 pg (ref 26.0–34.0)
MCHC: 34.1 g/dL (ref 30.0–36.0)
MCV: 90 fL (ref 80.0–100.0)
Platelets: 426 K/uL — ABNORMAL HIGH (ref 150–400)
RBC: 2.9 MIL/uL — ABNORMAL LOW (ref 3.87–5.11)
RDW: 14.5 % (ref 11.5–15.5)
WBC: 10.9 K/uL — ABNORMAL HIGH (ref 4.0–10.5)
nRBC: 0 % (ref 0.0–0.2)

## 2024-11-03 LAB — CULTURE, BLOOD (ROUTINE X 2)
Culture: NO GROWTH
Special Requests: ADEQUATE

## 2024-11-03 NOTE — Plan of Care (Signed)
" °  Problem: Education: Goal: Knowledge of General Education information will improve Description: Including pain rating scale, medication(s)/side effects and non-pharmacologic comfort measures Outcome: Progressing   Problem: Health Behavior/Discharge Planning: Goal: Ability to manage health-related needs will improve Outcome: Progressing   Problem: Clinical Measurements: Goal: Ability to maintain clinical measurements within normal limits will improve Outcome: Progressing Goal: Will remain free from infection Outcome: Progressing Goal: Diagnostic test results will improve Outcome: Progressing Goal: Respiratory complications will improve Outcome: Progressing Goal: Cardiovascular complication will be avoided Outcome: Progressing   Problem: Activity: Goal: Risk for activity intolerance will decrease Outcome: Progressing   Problem: Nutrition: Goal: Adequate nutrition will be maintained Outcome: Progressing   Problem: Coping: Goal: Level of anxiety will decrease Outcome: Progressing   Problem: Elimination: Goal: Will not experience complications related to bowel motility Outcome: Progressing Goal: Will not experience complications related to urinary retention Outcome: Progressing   Problem: Pain Managment: Goal: General experience of comfort will improve and/or be controlled Outcome: Progressing   Problem: Safety: Goal: Ability to remain free from injury will improve Outcome: Progressing   Problem: Skin Integrity: Goal: Risk for impaired skin integrity will decrease Outcome: Progressing   Problem: Education: Goal: Understanding of discharge needs will improve Outcome: Progressing Goal: Verbalization of understanding of the causes of altered bowel function will improve Outcome: Progressing   Problem: Activity: Goal: Ability to tolerate increased activity will improve Outcome: Progressing   Problem: Bowel/Gastric: Goal: Gastrointestinal status for postoperative  course will improve Outcome: Progressing   Problem: Health Behavior/Discharge Planning: Goal: Identification of community resources to assist with postoperative recovery needs will improve Outcome: Progressing   Problem: Nutritional: Goal: Will attain and maintain optimal nutritional status will improve Outcome: Progressing   Problem: Clinical Measurements: Goal: Postoperative complications will be avoided or minimized Outcome: Progressing   Problem: Respiratory: Goal: Respiratory status will improve Outcome: Progressing   Problem: Skin Integrity: Goal: Will show signs of wound healing Outcome: Progressing   Problem: Education: Goal: Knowledge of the prescribed therapeutic regimen will improve Outcome: Progressing   Problem: Bowel/Gastric: Goal: Gastrointestinal status for postoperative course will improve Outcome: Progressing   Problem: Cardiac: Goal: Ability to maintain an adequate cardiac output Outcome: Progressing Goal: Will show no evidence of cardiac arrhythmias Outcome: Progressing   Problem: Nutritional: Goal: Will attain and maintain optimal nutritional status Outcome: Progressing   Problem: Neurological: Goal: Will regain or maintain usual level of consciousness Outcome: Progressing   Problem: Clinical Measurements: Goal: Ability to maintain clinical measurements within normal limits Outcome: Progressing Goal: Postoperative complications will be avoided or minimized Outcome: Progressing   Problem: Respiratory: Goal: Will regain and/or maintain adequate ventilation Outcome: Progressing Goal: Respiratory status will improve Outcome: Progressing   Problem: Skin Integrity: Goal: Demonstrates signs of wound healing without infection Outcome: Progressing   Problem: Urinary Elimination: Goal: Will remain free from infection Outcome: Progressing Goal: Ability to achieve and maintain adequate urine output Outcome: Progressing   "

## 2024-11-03 NOTE — Progress Notes (Signed)
 11/03/2024  Michelle Levy [id number] [birth year]  CARE TEAM: PCP: Aisha Harvey, MD  Outpatient Care Team: Patient Care Team: Aisha Harvey, MD as PCP - General (Family Medicine)  Inpatient Treatment Team: Treatment Team:  Darci Pore, MD Ccs, Md, MD Sherree Stephane KIDD, RN Bobbette Mattocks, MD Ringler, [name] CROME, RN Trudy Rexene SAILOR, PT Zuniga Jacobo, Nicole I, NT Nicholaus Quarry, [facility] Bantel, Kaitlyn C, RN   Problem List:   Principal Problem:   Bowel obstruction (HCC) Active Problems:   Bipolar disorder, curr episode mixed, severe, w/o psychotic features (HCC)   Abscess of sigmoid colon due to diverticulitis   Hyponatremia   Hypokalemia   Hypermagnesemia   Colovesical fistula   10/30/2024  Procedures: CYSTOSCOPY WITH INDOCYANINE GREEN  IMAGING (ICG) DIAGNOSTIC LAPAROSCOPY, OMENTOPEXY OF BLADDER FISTULA, AND SEROSAL REPAIR OF CECUM HARTMANN PROCEDURE    Assessment [facility] Stay = 6 days) 4 Days Post-Op     Overall patient is recovering relatively well.    Plan:   - From a surgical perspective the patient is stable to discharge home. She requests to stay another day. Will reassess tomorrow. Outpatient follow up is arranged.  - Continue with regular diet  - Remove drain. Remove wound vac and switch to wet to try dressing once a day.  - Establish home health care for ostomy.  - Continue Antibiotics X 4 days  - Okay to D/C Telemetry from a surgical perspective.  Telemetry is still on.  - Monitor electrolytes & replace as needed  - Hyponatremia resolving continue to monitor Keep K>4, Mg>2, Phos>3  -VTE prophylaxis- SCDs.  Anticoagulation prophyllaxis SQ as appropriate until discharge.  - HGB still relatively low but has increased from 8.5 on 11/02/24 to 8.9 on 11/03/24. Patient has no symptoms of anemia. Not concerning from a surgical perspective.  - Mobilize as tolerated to help recovery.  Enlist therapies in moderate/high risk patients as  appropriate  - Pain Management  - Continue PRN pain medication to optimize recovery.   I updated the patient's status to the patient  Recommendations were made.  Questions were answered.  She expressed understanding & appreciation.  -Disposition:  Disposition:  The patient is from: Home Anticipate discharge to:  Home Anticipated Date of Discharge is:  11/04/2024 Barriers to discharge:  Patient uncomfortable leaving today.  Patient currently is medically stable for discharge from the hospital from a surgery standpoint. Patient plans to have Ex -husband assist with at home recovery and home health.      I reviewed last 24 h vitals and pain scores, last 48 h intake and output, last 24 h labs and trends, and last 24 h imaging results.  I have reviewed this patient's available data, including medical history, events of note, test results, etc as part of my evaluation.   A significant portion of that time was spent in counseling. Care during the described time interval was provided by me.  This care required moderate level of medical decision making.  11/03/2024    Subjective: (Chief complaint)  Patient stated she is overall feeling well. Pain is well controlled with PRN pain meds.   She is tolerating a regular diet and oral fluids.  She denies nausea, vomiting, difficulties urinating, or blood in urine.  BM and flatus via ostomy bag.  She ambulates several times a day to the hall and chair.    Objective:  Vital signs:  Vitals:   11/02/24 0414 11/02/24 1453 11/02/24 1944 11/03/24 0332  BP: 105/63 110/60 105/60 113/66  Pulse: 82 83 83 79  Resp: 16 18 18 18   Temp: 97.8 F (36.6 C) 98.1 F (36.7 C) 98.7 F (37.1 C) 98.9 F (37.2 C)  TempSrc: Oral Oral Oral Oral  SpO2:   97% 98%  Weight:      Height:        Last BM Date : 11/02/24  Intake/Output   Yesterday:  01/16 0701 - 01/17 0700 In: 200 [IV Piggyback:200] Out: 245 [Drains:160; Stool:85] This  shift:  No intake/output data recorded.  Bowel function:  Flatus: YES  BM:  YES  Drain: Serosanguinous   Physical Exam:  General: Pt awake/alert in no acute distress Eyes: PERRL, normal EOM.  Sclera clear.  No icterus Neuro: CN II-XII intact w/o focal sensory/motor deficits. Lymph: No head/neck/groin lymphadenopathy Psych:  No delerium/psychosis/paranoia.  Oriented x 4 HENT: Normocephalic, Mucus membranes moist.  No thrush Neck: Supple, No tracheal deviation.  No obvious thyromegaly Chest: No pain to chest wall compression.  Good respiratory excursion.  No audible wheezing CV:  Pulses intact.  Regular rhythm.  No major extremity edema MS: Normal AROM mjr joints.  No obvious deformity  Abdomen: Soft. Mildly distended and tender at the incision sites only.  No evidence of peritonitis.  No incarcerated hernias.  Ext:   No deformity.  No mjr edema.  No cyanosis Skin: No petechiae / purpurea.  No major sores.  Warm and dry    Results:   Cultures: Recent Results (from the past 720 hours)  Blood culture (routine x 2)     Status: None   Collection Time: 10/28/24  4:01 PM   Specimen: BLOOD  Result Value Ref Range Status   Specimen Description   Final    BLOOD BLOOD RIGHT HAND Performed at [facility], [address], [place], [place] [address]    Special Requests   Final    BOTTLES DRAWN AEROBIC AND ANAEROBIC Blood Culture results may not be optimal due to an inadequate volume of blood received in culture bottles Performed at [facility], [address] [place], [place] [address]    Culture   Final    NO GROWTH 5 DAYS Performed at [facility], [address] [place], [place] [address]    Report Status 11/02/2024 FINAL  Final  Blood culture (routine x 2)     Status: None   Collection Time: 10/29/24  5:31 AM   Specimen: BLOOD LEFT ARM  Result Value Ref Range Status   Specimen Description   Final    BLOOD LEFT ARM Performed  at [facility], [address] [place], [place] [address]    Special Requests   Final    BOTTLES DRAWN AEROBIC AND ANAEROBIC Blood Culture adequate volume Performed at [facility], [address] [place], [place] [address]    Culture   Final    NO GROWTH 5 DAYS Performed at [facility], [address] [place], [place] [address]    Report Status 11/03/2024 FINAL  Final  Urine Culture     Status: None   Collection Time: 10/29/24  9:41 AM   Specimen: Urine, Random  Result Value Ref Range Status   Specimen Description   Final    URINE, RANDOM Performed at [place] [facility], [address], [place], [place] [address]    Special Requests   Final    NONE Reflexed from [id number] Performed at [facility], [address] [place], [place] [address]    Culture   Final    NO GROWTH Performed at [facility], [address] [place], [place] [address]    Report Status 10/30/2024 FINAL  Final    Labs: Results for orders placed or performed during the hospital encounter of 10/28/24 (from the past 48 hours)  CBC     Status: Abnormal   Collection Time: 11/01/24  8:27 AM  Result Value Ref Range   WBC 14.4 (H) 4.0 - 10.5 K/uL   RBC 3.26 (L) 3.87 - 5.11 MIL/uL   Hemoglobin 10.0 (L) 12.0 - 15.0 g/dL   HCT 70.7 (L) 63.9 - 53.9 %   MCV 89.6 80.0 - 100.0 fL   MCH 30.7 26.0 - 34.0 pg   MCHC 34.2 30.0 - 36.0 g/dL   RDW 85.3 88.4 - 84.4 %   Platelets 437 (H) 150 - 400 K/uL   nRBC 0.0 0.0 - 0.2 %    Comment: Performed at [facility], [address], [place], [place] [address]  Basic metabolic panel with GFR     Status: Abnormal   Collection Time: 11/01/24  5:53 PM  Result Value Ref Range   Sodium 130 (L) 135 - 145 mmol/L   Potassium 4.7 3.5 - 5.1 mmol/L    Comment: HEMOLYSIS AT THIS LEVEL MAY AFFECT RESULT   Chloride 96 (L) 98 - 111 mmol/L   CO2 28 22 - 32 mmol/L   Glucose, Bld 135 (H) 70 - 99  mg/dL    Comment: Glucose reference range applies only to samples taken after fasting for at least 8 hours.   BUN 19 8 - 23 mg/dL   Creatinine, Ser 9.33 0.44 - 1.00 mg/dL   Calcium  7.8 (L) 8.9 - 10.3 mg/dL   GFR, Estimated >39 >39 mL/min    Comment: (NOTE) Calculated using the CKD-EPI Creatinine Equation (2021)    Anion gap 7 5 - 15    Comment: Performed at [facility], [address], [place], [place] [address]  Basic metabolic panel with GFR     Status: Abnormal   Collection Time: 11/02/24  6:15 AM  Result Value Ref Range   Sodium 131 (L) 135 - 145 mmol/L   Potassium 3.8 3.5 - 5.1 mmol/L    Comment: Delta check noted    Chloride 97 (L) 98 - 111 mmol/L   CO2 29 22 - 32 mmol/L   Glucose, Bld 93 70 - 99 mg/dL    Comment: Glucose reference range applies only to samples taken after fasting for at least 8 hours.   BUN 16 8 - 23 mg/dL   Creatinine, Ser 9.44 0.44 - 1.00 mg/dL   Calcium  7.8 (L) 8.9 - 10.3 mg/dL   GFR, Estimated >39 >39 mL/min    Comment: (NOTE) Calculated using the CKD-EPI Creatinine Equation (2021)    Anion gap 6 5 - 15    Comment: Performed at [facility], [address], [place], [place] [address]  CBC     Status: Abnormal   Collection Time: 11/02/24  6:15 AM  Result Value Ref Range   WBC 12.6 (H) 4.0 - 10.5 K/uL   RBC 2.78 (L) 3.87 - 5.11 MIL/uL   Hemoglobin 8.5 (L) 12.0 - 15.0 g/dL   HCT 75.2 (L) 63.9 - 53.9 %   MCV 88.8 80.0 - 100.0 fL   MCH 30.6 26.0 - 34.0 pg   MCHC 34.4 30.0 -  36.0 g/dL   RDW 85.5 88.4 - 84.4 %   Platelets 397 150 - 400 K/uL   nRBC 0.0 0.0 - 0.2 %    Comment: Performed at [facility], [address], [place], [place] [address]  CBC     Status: Abnormal   Collection Time: 11/03/24  5:43 AM  Result Value Ref Range   WBC 10.9 (H) 4.0 - 10.5 K/uL   RBC 2.90 (L) 3.87 - 5.11 MIL/uL   Hemoglobin 8.9 (L) 12.0 - 15.0 g/dL   HCT 73.8 (L) 63.9 - 53.9 %   MCV 90.0 80.0 - 100.0 fL    MCH 30.7 26.0 - 34.0 pg   MCHC 34.1 30.0 - 36.0 g/dL   RDW 85.4 88.4 - 84.4 %   Platelets 426 (H) 150 - 400 K/uL   nRBC 0.0 0.0 - 0.2 %    Comment: Performed at [facility], [address], [place], [place] [address]    Imaging / Studies: No results found.  Medications / Allergies: per chart  Antibiotics: Anti-infectives (From admission, onward)    Start     Dose/Rate Route Frequency Ordered Stop   10/30/24 1600  piperacillin -tazobactam (ZOSYN ) IVPB 3.375 g        3.375 g 12.5 mL/hr over 240 Minutes Intravenous Every 8 hours 10/30/24 1230 11/04/24 1559   10/30/24 0600  cefoTEtan  (CEFOTAN ) 2 g in sodium chloride  0.9 % 100 mL IVPB        2 g 200 mL/hr over 30 Minutes Intravenous On call to O.R. 10/29/24 1219 10/30/24 0817   10/29/24 1400  neomycin  (MYCIFRADIN ) tablet 1,000 mg       Placed in And Linked Group   1,000 mg Per Tube 3 times per day 10/29/24 1219 10/29/24 2211   10/29/24 1400  metroNIDAZOLE  (FLAGYL ) tablet 1,000 mg       Placed in And Linked Group   1,000 mg Per Tube 3 times per day 10/29/24 1219 10/29/24 2211   10/29/24 0000  piperacillin -tazobactam (ZOSYN ) IVPB 3.375 g  Status:  Discontinued        3.375 g 12.5 mL/hr over 240 Minutes Intravenous Every 8 hours 10/28/24 2146 10/30/24 1230   10/28/24 1630  piperacillin -tazobactam (ZOSYN ) IVPB 3.375 g        3.375 g 100 mL/hr over 30 Minutes Intravenous  Once 10/28/24 1624 10/28/24 1753   10/28/24 1600  cefTRIAXone  (ROCEPHIN ) 1 g in sodium chloride  0.9 % 100 mL IVPB  Status:  Discontinued        1 g 200 mL/hr over 30 Minutes Intravenous  Once 10/28/24 1550 10/28/24 1611         Note: Portions of this report may have been transcribed using voice recognition software. Every effort was made to ensure accuracy; however, inadvertent computerized transcription errors may be present.   Any transcriptional errors that result from this process are unintentional.   Lauraine Ronnald Nick PA -  [facility]  11/03/2024  7:50 AM

## 2024-11-03 NOTE — Plan of Care (Signed)

## 2024-11-03 NOTE — Progress Notes (Signed)
 " Progress Note   Patient: Michelle Levy FMW:[id number] DOB: [birth year] DOA: 10/28/2024     6 DOS: the patient was seen and examined on 11/03/2024   Brief hospital course: Michelle Levy is a 69 y.o. female with history of bipolar disorder who was recently admitted last month from 10/02/2024 through 10/05/2024 for diverticulitis with abscess managed conservatively with antibiotics states that since Wednesday about 4 days ago patient has been having abdominal discomfort mostly in the left lower quadrant.  In the ER CT showed features concerning for large bowel obstruction and colovesical fistula.  Laboratory findings showed sodium of 123, white count 12.6, abnormal UA.  General surgery consulted.  Patient is started on empiric antibiotics, NG tube decompression admitted to TRH service for further management evaluation.   Surgery team performed [facility] procedure, partial colectomy with colostomy, bladder fistula repair 10/30/2024.  NG tube removed, she is able to tolerate soft diet. JP drain, vac removed today. Wishes to go home tomorrow. Need HH for wound care.  Assessment and Plan: Large bowel obstruction Colovesical fistula- Sigmoid diverticulitis with abscess causing obstruction- S/p Hartmann procedure, partial colectomy with end colostomy, omentopexy of bladder fistula, serosal repair of cecum, placement of pelvic drain. Path reviewed shows: Sigmoid stricture, diverticulosis and diverticulitis. Continue Zosyn  therapy. NG tube removed, diet advanced to soft. Good ostomy output. General surgery follow-up appreciated. Wound vac, drain removed. Need HH for wound care.  Hyponatremia- Chronic, baseline sodium 127-130. Sodium improved. Trend sodium.  Hypokalemia- Improved with supplementation. Continue to recheck electrolytes and replete accordingly.  Acute on chronic anemia- Post op anemia. Continue to trend.  Hypermagnesemia: Improved. Hold home mag citrate supplementation.  Bipolar  disorder- Resumed Depakote  from tonight..     Out of bed to chair. Incentive spirometry. Nursing supportive care. Fall, aspiration precautions. Diet:  Diet Orders (From admission, onward)     Start     Ordered   11/02/24 1220  Diet regular Room service appropriate? Yes; Fluid consistency: Thin  Diet effective now       Comments: If patient experiences nausea, vomiting, worsening pain, or worsening distention; then, make NPO with sips/ice chips only until seen by MD  Question Answer Comment  Room service appropriate? Yes   Fluid consistency: Thin      11/02/24 1219           DVT prophylaxis: SCDs Start: 10/28/24 2132  Level of care: Telemetry   Code Status: Full Code  Subjective: Patient is seen and examined today morning.  She is lying comfortably, eating soft diet. She got education regarding wound care, ostomy. Wishes to go home tomorrow as she feel stressed with out help.  Physical Exam: Vitals:   11/02/24 1453 11/02/24 1944 11/03/24 0332 11/03/24 1318  BP: 110/60 105/60 113/66 (!) 117/57  Pulse: 83 83 79 78  Resp: 18 18 18 16   Temp: 98.1 F (36.7 C) 98.7 F (37.1 C) 98.9 F (37.2 C) 98.5 F (36.9 C)  TempSrc: Oral Oral Oral Oral  SpO2:  97% 98%   Weight:      Height:        General - Elderly Caucasian thin built female, no apparent distress HEENT - PERRLA, EOMI, atraumatic head. Lung - Clear, basal rales, rhonchi, no wheezes. Heart - S1, S2 heard, no murmurs, rubs, trace pedal edema. Abdomen - Soft, mild diffuse tender, colostomy, drain noted. Neuro - Alert, awake and oriented x 3, non focal exam. Skin - Warm and dry.  Data Reviewed:  Latest Ref Rng & Units 11/03/2024    5:43 AM 11/02/2024    6:15 AM 11/01/2024    8:27 AM  CBC  WBC 4.0 - 10.5 K/uL 10.9  12.6  14.4   Hemoglobin 12.0 - 15.0 g/dL 8.9  8.5  89.9   Hematocrit 36.0 - 46.0 % 26.1  24.7  29.2   Platelets 150 - 400 K/uL 426  397  437       Latest Ref Rng & Units 11/02/2024    6:15  AM 11/01/2024    5:53 PM 11/01/2024    6:22 AM  BMP  Glucose 70 - 99 mg/dL 93  864  94   BUN 8 - 23 mg/dL 16  19  17    Creatinine 0.44 - 1.00 mg/dL 9.44  9.33  9.25   Sodium 135 - 145 mmol/L 131  130  132   Potassium 3.5 - 5.1 mmol/L 3.8  4.7  4.0   Chloride 98 - 111 mmol/L 97  96  98   CO2 22 - 32 mmol/L 29  28  27    Calcium  8.9 - 10.3 mg/dL 7.8  7.8  8.0    No results found.   Family Communication: Discussed with patient, she understand and agree. All questions answered.  Disposition: Status is: Inpatient Remains inpatient appropriate because: S/p Hartman surgery, IV antibiotics, advance diet, surgery team follow-up  Planned Discharge Destination: Home with Home Health     Time spent: 45 minutes  Author: Concepcion Riser, MD 11/03/2024 2:38 PM Secure chat 7am to 7pm For on call review [email or web address]    "

## 2024-11-03 NOTE — Progress Notes (Signed)
 Physical Therapy Treatment Patient Details Name: Michelle Levy MRN: [id number] DOB: [birth year] Today's Date: 11/03/2024   History of Present Illness Patient is a 69 yo female presenting to the ED with abdominal pain, nausea, and diarrhea on 10/28/24. CT showing large bowel obstruction. Laparoscopy, omentopexy of bladder fistula, serosal repair of cecum and cystoscopy completed on 1/13. PMH includes:  alcohol use disorder, bipolar disorder, diverticulitis with intra-abdominal abscesses    PT Comments  Pt making good progress today. Attempted gait without device, pt with unsteady gait requiring assist to balance without UE support, reliant on rails/doorway etc. Transitioned to RW with noted Improved in gait stability and safety. Continue to recommend RW for home; HHPT vs no f/u pending progress.  HR 90s at rest, max 112 while ambulating, returned to 90s at rest.   If plan is discharge home, recommend the following: A little help with bathing/dressing/bathroom;Assistance with cooking/housework;Help with stairs or ramp for entrance   Can travel by private vehicle        Equipment Recommendations  Rolling walker (2 wheels)    Recommendations for Other Services       Precautions / Restrictions Precautions Recall of Precautions/Restrictions: Intact Precaution/Restrictions Comments: abd wound, ostomy Restrictions Weight Bearing Restrictions Per Provider Order: No     Mobility  Bed Mobility               General bed mobility comments: in recliner pre and post session    Transfers Overall transfer level: Needs assistance Equipment used: Rolling walker (2 wheels) Transfers: Sit to/from Stand Sit to Stand: Supervision           General transfer comment: cues forhand placement, to power up with LEs. STS from recliner and toilet    Ambulation/Gait Ambulation/Gait assistance: Supervision, Contact guard assist Gait Distance (Feet): 140 Feet (15' more) Assistive device:  Rolling walker (2 wheels), None Gait Pattern/deviations: Step-through pattern, Decreased stride length Gait velocity: decreased but functional     General Gait Details: initiated gait without device,pt needing HHA or rail support, unsteady gait needed CGA to light min assist for safety. transitioned to RW with much improved gait stability, supervision   Stairs             Wheelchair Mobility     Tilt Bed    Modified Rankin (Stroke Patients Only)       Balance   Sitting-balance support: Feet supported, No upper extremity supported Sitting balance-Leahy Scale: Good Sitting balance - Comments: did not challenge   Standing balance support: Bilateral upper extremity supported, During functional activity, No upper extremity supported Standing balance-Leahy Scale: Fair Standing balance comment: reliant on at least unilateral UE support for balance/dynamic activity.                            Communication Communication Communication: No apparent difficulties  Cognition Arousal: Alert Behavior During Therapy: WFL for tasks assessed/performed   PT - Cognitive impairments: No apparent impairments                         Following commands: Intact      Cueing Cueing Techniques: Verbal cues  Exercises      General Comments        Pertinent Vitals/Pain Pain Assessment Pain Assessment: Faces Faces Pain Scale: Hurts a little bit Pain Location: abodominal pain Pain Descriptors / Indicators: Grimacing, Guarding, Discomfort Pain Intervention(s): Limited activity within patient's tolerance, Monitored  during session, Repositioned    Home Living                          Prior Function            PT Goals (current goals can now be found in the care plan section) Acute Rehab PT Goals Patient Stated Goal: return home PT Goal Formulation: With patient Time For Goal Achievement: 11/14/24 Potential to Achieve Goals: Good Progress  towards PT goals: Progressing toward goals    Frequency    Min 2X/week      PT Plan      Co-evaluation              AM-PAC PT 6 Clicks Mobility   Outcome Measure  Help needed turning from your back to your side while in a flat bed without using bedrails?: A Little Help needed moving from lying on your back to sitting on the side of a flat bed without using bedrails?: A Little Help needed moving to and from a bed to a chair (including a wheelchair)?: A Little Help needed standing up from a chair using your arms (e.g., wheelchair or bedside chair)?: A Little Help needed to walk in hospital room?: A Little Help needed climbing 3-5 steps with a railing? : A Little 6 Click Score: 18    End of Session   Activity Tolerance: Patient tolerated treatment well Patient left: with call bell/phone within reach;in chair   PT Visit Diagnosis: Other abnormalities of gait and mobility (R26.89)     Time: 8447-8387 PT Time Calculation (min) (ACUTE ONLY): 20 min  Charges:    $Gait Training: 8-22 mins PT General Charges $$ ACUTE PT VISIT: 1 Visit                     Rexene, PT  Acute Rehab Dept [facility]) [phone]  11/03/2024    [facility] 11/03/2024, 4:21 PM

## 2024-11-03 NOTE — TOC Initial Note (Signed)
 Transition of Care [facility]) - Initial/Assessment Note    Patient Details  Name: Michelle Levy MRN: [id number] Date of Birth: Feb 17, 1956  Transition [facility]) CM/SW Contact:    Sonda Manuella Quill, RN Phone Number: 11/03/2024, 6:13 PM  Clinical Narrative:                 Orders received for HHPT/OR/RN and RW; spoke w. Pt in room; pt said she lives at home; she plans to return w/ family support at d/c; her family will provide transportation; pt identified POC dtr Tinnie Haddock [phone]); insurance/PCP verified; pt denied SDOH risks; pt does not have DME, HH services, or home oxygen; pt agreed to receive recc HH services and DME; she does not have agency preference; referrals sent via hub; awaiting offers; IP CM is following.  Expected Discharge Plan: Home w Home Health Services Barriers to Discharge: Continued Medical Work up   Patient Goals and CMS Choice Patient states their goals for this hospitalization and ongoing recovery are:: home CMS Medicare.gov Compare Post Acute Care list provided to:: Patient   [place] ownership interest in [facility].provided to:: Patient    Expected Discharge Plan and Services   Discharge Planning Services: CM Consult                                          Prior Living Arrangements/Services   Lives with:: Self Patient language and need for interpreter reviewed:: Yes Do you feel safe going back to the place where you live?: Yes      Need for Family Participation in Patient Care: Yes (Comment) Care giver support system in place?: Yes (comment) Current home services:  (n/a) Criminal Activity/Legal Involvement Pertinent to Current Situation/Hospitalization: No - Comment as needed  Activities of Daily Living   ADL Screening (condition at time of admission) Independently performs ADLs?: Yes (appropriate for developmental age) Is the patient deaf or have difficulty hearing?: Yes Does the patient have difficulty  seeing, even when wearing glasses/contacts?: No Does the patient have difficulty concentrating, remembering, or making decisions?: No  Permission Sought/Granted Permission sought to share information with : Case Manager Permission granted to share information with : Yes, Verbal Permission Granted  Share Information with NAME: Case Manager     Permission granted to share info w Relationship: Tinnie Haddock (dtr) [phone]     Emotional Assessment Appearance:: Appears stated age Attitude/Demeanor/Rapport: Gracious Affect (typically observed): Accepting Orientation: : Oriented to Self, Oriented to Place, Oriented to  Time, Oriented to Situation Alcohol / Substance Use: Not Applicable Psych Involvement: No (comment)  Admission diagnosis:  Hyponatremia [E87.1] Bowel obstruction (HCC) [K56.609] Generalized abdominal pain [R10.84] Intra-abdominal abscess (HCC) [K65.1] Large bowel obstruction (HCC) [K56.609] SIRS (systemic inflammatory response syndrome) (HCC) [R65.10] Acute cystitis with hematuria [N30.01] Patient Active Problem List   Diagnosis Date Noted   Colovesical fistula 10/30/2024   Hypokalemia 10/29/2024   Hypermagnesemia 10/29/2024   Bowel obstruction (HCC) 10/28/2024   Hyponatremia 10/02/2024   Elevated LFTs 10/02/2024   Abscess of sigmoid colon due to diverticulitis 10/01/2024   Bipolar disorder, curr episode mixed, severe, w/o psychotic features (HCC) 11/24/2015   PCP:  Aisha Harvey, MD Pharmacy:   [facility] [address], [place] - [address] [place] [address] Phone: [phone] Fax: [phone]     Social Drivers of Health (SDOH) Social History:  SDOH Screenings   Food Insecurity: No Food Insecurity (11/03/2024)  Housing: Low Risk (11/03/2024)  Transportation Needs: No Transportation Needs (11/03/2024)  Utilities: Not At Risk (11/03/2024)  Alcohol Screen: Low Risk (09/07/2023)  Depression (PHQ2-9): Low Risk  (05/09/2024)  Social Connections: Moderately Integrated (10/29/2024)  Tobacco Use: Low Risk (10/30/2024)   SDOH Interventions: Food Insecurity Interventions: Intervention Not Indicated, Inpatient TOC Housing Interventions: Intervention Not Indicated, Inpatient TOC Transportation Interventions: Intervention Not Indicated, Inpatient TOC Utilities Interventions: Intervention Not Indicated, Inpatient TOC   Readmission Risk Interventions    11/03/2024    6:11 PM 10/29/2024    9:27 AM 10/05/2024   10:28 AM  Readmission Risk Prevention Plan  Post Dischage Appt  Complete Complete  Medication Screening  Complete Complete  Transportation Screening Complete Complete Complete  PCP or Specialist Appt within 5-7 Days Complete    Home Care Screening Complete    Medication Review (RN CM) Complete

## 2024-11-03 NOTE — Progress Notes (Signed)
 JP drain removed from right side of abdomen per orders. Suture cut and removed and drain pulled. Patient tolerated removal well. Dressing applied after removal.

## 2024-11-04 ENCOUNTER — Other Ambulatory Visit (HOSPITAL_COMMUNITY): Payer: Self-pay

## 2024-11-04 DIAGNOSIS — K5720 Diverticulitis of large intestine with perforation and abscess without bleeding: Secondary | ICD-10-CM | POA: Diagnosis not present

## 2024-11-04 DIAGNOSIS — N321 Vesicointestinal fistula: Secondary | ICD-10-CM | POA: Diagnosis not present

## 2024-11-04 DIAGNOSIS — F3163 Bipolar disorder, current episode mixed, severe, without psychotic features: Secondary | ICD-10-CM | POA: Diagnosis not present

## 2024-11-04 DIAGNOSIS — K56609 Unspecified intestinal obstruction, unspecified as to partial versus complete obstruction: Secondary | ICD-10-CM | POA: Diagnosis not present

## 2024-11-04 MED ORDER — OXYCODONE HCL 5 MG PO TABS
5.0000 mg | ORAL_TABLET | Freq: Four times a day (QID) | ORAL | 0 refills | Status: AC | PRN
  Filled 2024-11-04: qty 20, 5d supply, fill #0

## 2024-11-04 MED ORDER — METHOCARBAMOL 500 MG PO TABS
500.0000 mg | ORAL_TABLET | Freq: Three times a day (TID) | ORAL | 0 refills | Status: AC | PRN
  Filled 2024-11-04: qty 30, 10d supply, fill #0

## 2024-11-04 NOTE — Plan of Care (Signed)
" °  Problem: Education: Goal: Knowledge of General Education information will improve Description: Including pain rating scale, medication(s)/side effects and non-pharmacologic comfort measures Outcome: Adequate for Discharge   Problem: Health Behavior/Discharge Planning: Goal: Ability to manage health-related needs will improve Outcome: Adequate for Discharge   Problem: Clinical Measurements: Goal: Ability to maintain clinical measurements within normal limits will improve Outcome: Adequate for Discharge Goal: Will remain free from infection Outcome: Adequate for Discharge Goal: Diagnostic test results will improve Outcome: Adequate for Discharge Goal: Respiratory complications will improve Outcome: Adequate for Discharge Goal: Cardiovascular complication will be avoided Outcome: Adequate for Discharge   Problem: Activity: Goal: Risk for activity intolerance will decrease Outcome: Adequate for Discharge   Problem: Nutrition: Goal: Adequate nutrition will be maintained Outcome: Adequate for Discharge   Problem: Coping: Goal: Level of anxiety will decrease Outcome: Adequate for Discharge   Problem: Elimination: Goal: Will not experience complications related to bowel motility Outcome: Adequate for Discharge Goal: Will not experience complications related to urinary retention Outcome: Adequate for Discharge   Problem: Pain Managment: Goal: General experience of comfort will improve and/or be controlled Outcome: Adequate for Discharge   Problem: Safety: Goal: Ability to remain free from injury will improve Outcome: Adequate for Discharge   Problem: Skin Integrity: Goal: Risk for impaired skin integrity will decrease Outcome: Adequate for Discharge   Problem: Education: Goal: Understanding of discharge needs will improve Outcome: Adequate for Discharge Goal: Verbalization of understanding of the causes of altered bowel function will improve Outcome: Adequate for  Discharge   Problem: Activity: Goal: Ability to tolerate increased activity will improve Outcome: Adequate for Discharge   Problem: Bowel/Gastric: Goal: Gastrointestinal status for postoperative course will improve Outcome: Adequate for Discharge   Problem: Health Behavior/Discharge Planning: Goal: Identification of community resources to assist with postoperative recovery needs will improve Outcome: Adequate for Discharge   Problem: Nutritional: Goal: Will attain and maintain optimal nutritional status will improve Outcome: Adequate for Discharge   Problem: Clinical Measurements: Goal: Postoperative complications will be avoided or minimized Outcome: Adequate for Discharge   Problem: Respiratory: Goal: Respiratory status will improve Outcome: Adequate for Discharge   Problem: Skin Integrity: Goal: Will show signs of wound healing Outcome: Adequate for Discharge   Problem: Education: Goal: Knowledge of the prescribed therapeutic regimen will improve Outcome: Adequate for Discharge   Problem: Bowel/Gastric: Goal: Gastrointestinal status for postoperative course will improve Outcome: Adequate for Discharge   Problem: Cardiac: Goal: Ability to maintain an adequate cardiac output Outcome: Adequate for Discharge Goal: Will show no evidence of cardiac arrhythmias Outcome: Adequate for Discharge   Problem: Nutritional: Goal: Will attain and maintain optimal nutritional status Outcome: Adequate for Discharge   Problem: Neurological: Goal: Will regain or maintain usual level of consciousness Outcome: Adequate for Discharge   Problem: Clinical Measurements: Goal: Ability to maintain clinical measurements within normal limits Outcome: Adequate for Discharge Goal: Postoperative complications will be avoided or minimized Outcome: Adequate for Discharge   Problem: Respiratory: Goal: Will regain and/or maintain adequate ventilation Outcome: Adequate for Discharge Goal:  Respiratory status will improve Outcome: Adequate for Discharge   Problem: Skin Integrity: Goal: Demonstrates signs of wound healing without infection Outcome: Adequate for Discharge   Problem: Urinary Elimination: Goal: Will remain free from infection Outcome: Adequate for Discharge Goal: Ability to achieve and maintain adequate urine output Outcome: Adequate for Discharge   "

## 2024-11-04 NOTE — Discharge Summary (Signed)
 " Physician Discharge Summary   Patient: RIKI Levy MRN: [id number] DOB: [birth year]  Admit date:     10/28/2024  Discharge date: {dischdate:26783}  Discharge Physician: Concepcion Riser   PCP: Aisha Harvey, MD   Recommendations at discharge:  {Tip this will not be part of the note when signed- Example include specific recommendations for outpatient follow-up, pending tests to follow-up on. (Optional):26781}  ***  Discharge Diagnoses: Principal Problem:   Bowel obstruction (HCC) Active Problems:   Abscess of sigmoid colon due to diverticulitis   Bipolar disorder, curr episode mixed, severe, w/o psychotic features (HCC)   Hyponatremia   Hypokalemia   Hypermagnesemia   Colovesical fistula  Resolved Problems:   * No resolved hospital problems. [facility] Course: No notes on file  Assessment and Plan: No notes have been filed under this hospital service. Service: Hospitalist     {Tip this will not be part of the note when signed Body mass index is 21.95 kg/m. , ,  (Optional):26781}  {(NOTE) Pain control PDMP Statment (Optional):26782} Consultants: *** Procedures performed: ***  Disposition: {Plan; Disposition:26390} Diet recommendation:  {Diet_Plan:26776} DISCHARGE MEDICATION: Allergies as of 11/04/2024   No Known Allergies      Medication List     TAKE these medications    B-complex with vitamin C tablet Take 1 tablet by mouth daily.   CALCIUM  CARBONATE PO Take 250 mg by mouth 2 (two) times daily.   Cranberry 250 MG Caps Take 250 mg by mouth daily.   divalproex  250 MG 24 hr tablet Commonly known as: DEPAKOTE  ER Take 3 tablets (750 mg total) by mouth at bedtime.   FT VITAMIN K2 PO Take 100 mcg by mouth daily.   MAGNESIUM  CITRATE PO Take 2 capsules by mouth daily.   methocarbamol  500 MG tablet Commonly known as: ROBAXIN  Take 1 tablet (500 mg total) by mouth every 8 (eight) hours as needed for up to 10 days for muscle spasms.   Milk  Thistle 300 MG Caps Take 300 mg by mouth daily.   MULTIVITAMIN PO Take 1 tablet by mouth daily.   oxyCODONE  5 MG immediate release tablet Commonly known as: Oxy IR/ROXICODONE  Take 1 tablet (5 mg total) by mouth every 6 (six) hours as needed for moderate pain (pain score 4-6), severe pain (pain score 7-10) or breakthrough pain (5mg  for moderate pain, 10mg  for severe pain).   Triple Omega Complex Cpdr Take 1 capsule by mouth daily.   Vitamin D3 1000 units Caps Take 1,000 Units by mouth daily.               Discharge Care Instructions  (From admission, onward)           Start     Ordered   11/04/24 0000  Discharge wound care:       Comments: Colostomy care   11/04/24 1001            Contact information for follow-up providers     Sheldon Standing, MD. Schedule an appointment as soon as possible for a visit in 1 month(s).   Specialties: General Surgery, Colon and Rectal Surgery Why: 2:15pm, Arrive 30 minutes prior to your appointment time, Please bring your insurance card and photo ID, To follow up after your operation, To follow up after your hospital stay Contact information: [address] [place] [address] [phone]              Contact information for after-discharge care  [employer] (DME) .   Service: Durable Medical Equipment Contact information: [address] [place]  [address] [phone]                    Discharge Exam: Fredricka Weights   10/28/24 2100 10/29/24 0258  Weight: 56 kg 56.2 kg   ***  Condition at discharge: {DC Condition:26389}  The results of significant diagnostics from this hospitalization (including imaging, microbiology, ancillary and laboratory) are listed below for reference.   Imaging Studies: DG Abd Portable 1 View Result Date: 10/28/2024 EXAM: 1 VIEW XRAY OF THE ABDOMEN 10/28/2024 07:17:00 PM COMPARISON: CT  abdomen and pelvis 11/26 CLINICAL HISTORY: post NG tube insertion FINDINGS: LINES, TUBES AND DEVICES: Enteric tube in place with tip and side port below the diaphragm overlying the expected region of the stomach. BOWEL: Mild gaseous distention of small bowel and colon in the included abdomen. Hiatal hernia. SOFT TISSUES: No abnormal calcifications. BONES: No acute fracture. IMPRESSION: 1. Enteric tube in place with tip and side port below the diaphragm overlying the expected region of the stomach. 2. Mild gaseous distention of small bowel and colon in the included abdomen. Electronically signed by: Morgane Naveau MD MD 10/28/2024 07:20 PM EST RP Workstation: [id number]   CT ABDOMEN PELVIS W CONTRAST Result Date: 10/28/2024 EXAM: CT ABDOMEN AND PELVIS WITH CONTRAST 10/17/2024 03:16:21 PM TECHNIQUE: CT of the abdomen and pelvis was performed with the administration of 100 mL of iohexol  (OMNIPAQUE ) 300 MG/ML solution. Multiplanar reformatted images are provided for review. Automated exposure control, iterative reconstruction, and/or weight-based adjustment of the mA/kV was utilized to reduce the radiation dose to as low as reasonably achievable. COMPARISON: 10/01/2024 CLINICAL HISTORY: Diverticulitis, complication suspected; follow up diverticulitis with abscess not amenable to IR drainage. Perforated diverticulitis with multiple intra-abdominal abscesses. FINDINGS: LOWER CHEST: No acute abnormality. LIVER: The liver is unremarkable. GALLBLADDER AND BILE DUCTS: Gallbladder is unremarkable. No biliary ductal dilatation. SPLEEN: No acute abnormality. PANCREAS: No acute abnormality. ADRENAL GLANDS: No acute abnormality. KIDNEYS, URETERS AND BLADDER: No stones in the kidneys or ureters. No hydronephrosis. No perinephric or periureteral stranding. Small amount of nondependent gas is seen within the bladder lumen. The larger pericolonic abscess (1.8 x 3.0 cm, series 2, image 66) abuts the bladder dome. Development of an  indirect colovesicular fistula by way of the abscess cavity is not excluded. Correlation with urinalysis and urine culture may be helpful for confirmation. GI AND BOWEL: There is circumferential mural thickening and submucosal edema in the mid sigmoid colon, similar to prior examination. There is infiltrative soft tissue within the adjacent sigmoid mesentery (series 2, image 60) which may reflect inflammatory tissue though infiltrative disease as can be seen with a perforated malignancy, could appear similarly. Previously noted pericolonic abscesses have significantly decreased in size, measuring 1.8 x 3.0 cm (series 2, image 66) and 2.0 x 1.1 cm (series 2, image 63). Small amount of gas and fluid is seen within the larger collection. Proximal to the area of inflammatory change involving the mid sigmoid colon, the colon is unremarkable. Stomach and small bowel are unremarkable. There is no bowel obstruction. PERITONEUM AND RETROPERITONEUM: No ascites. No free air. VASCULATURE: Aorta is normal in caliber. LYMPH NODES: No lymphadenopathy. REPRODUCTIVE ORGANS: Calcified involuted fibroid within the uterus. Pelvic organs are otherwise unremarkable. BONES AND SOFT TISSUES: Osseous structures are age appropriate. No acute osseous abnormality. No lytic or blastic bone lesion. No focal soft tissue  abnormality. IMPRESSION: 1. Findings compatible with perforated sigmoid diverticulitis, with improving pericolonic abscesses, and persistent adjacent sigmoid mesenteric soft tissue which may be inflammatory; underlying perforated malignancy is not excluded. Correlation with endoscopy may be helpful once the patient's acute issues have resolved. 2. Gas and fluid within the larger pericolonic collection abutting the bladder dome with new intravesical gas, and an indirect colovesical fistula is not excluded; consider urinalysis and urine culture. Electronically signed by: Dorethia Molt MD MD 10/28/2024 06:38 PM EST RP Workstation:  [id number]   CT ABDOMEN PELVIS W CONTRAST Result Date: 10/28/2024 EXAM: CT ABDOMEN AND PELVIS WITH CONTRAST 10/28/2024 06:17:07 PM TECHNIQUE: CT of the abdomen and pelvis was performed with the administration of intravenous contrast. Multiplanar reformatted images are provided for review. Automated exposure control, iterative reconstruction, and/or weight-based adjustment of the mA/kV was utilized to reduce the radiation dose to as low as reasonably achievable. COMPARISON: 10/17/2024 and 10/01/2024 CLINICAL HISTORY: Unspecified abdominal pain, nausea, diarrhea, complicated diverticulitis with multiple intraabdominal abscesses. FINDINGS: LOWER CHEST: Right basilar discoid atelectasis. Small hiatal hernia. LIVER: The liver is unremarkable. GALLBLADDER AND BILE DUCTS: Gallbladder is unremarkable. No biliary ductal dilatation. SPLEEN: No acute abnormality. PANCREAS: No acute abnormality. ADRENAL GLANDS: No acute abnormality. KIDNEYS, URETERS AND BLADDER: No stones in the kidneys or ureters. No hydronephrosis. No perinephric or periureteral stranding. There has developed circumferential bladder wall thickening and prominent mucosal hyperemia suggesting a developing infectious or inflammatory cystitis. The bladder, however, is decompressed. There is suggestion of a possible indirect colovesicular fistula by way of the abscess cavity best appreciated on coronal image 67 - 70, series 7. Correlation with urine culture may be helpful for confirmation. GI AND BOWEL: Stomach and small bowel were unremarkable. Appendix normal. The previously identified intra-abdominal abscesses continued to improve and now have nearly resolved with the residual abscess cavities measuring 1.7 x 2.5 cm, containing no significant drainable fluid (image 75, series 2), and 12.5 x 2.4 cm containing a punctate focus of gas (image 73, series 2). However, a large bowel obstruction has developed with gas and fluid distention of the colon proximal to  the mid sigmoid colon where there is irregular transition to a decompressed sigmoid colon (image 72, series 2). While this may simply relate to edema or post inflammatory scarring, an underlying cicatricial mass is difficult to exclude on this examination. The cecum is dilated up to 9 cm in diameter. PERITONEUM AND RETROPERITONEUM: Interval development of mild ascites. No free intraperitoneal gas or free air. VASCULATURE: Aorta is normal in caliber. LYMPH NODES: No lymphadenopathy. REPRODUCTIVE ORGANS: Calcified inverted fibroid within the uterus. Pelvic organs are otherwise unremarkable. BONES AND SOFT TISSUES: Osseous structures are age appropriate. No acute bone abnormality. No lytic or blastic bone lesion. No focal soft tissue abnormality. IMPRESSION: 1. Large bowel obstruction with gas and fluid distention of the colon proximal to the mid sigmoid colon, where there is irregular transition to a decompressed sigmoid colon, with cecal dilation up to 9 cm in diameter; an underlying cicatricial mass is difficult to exclude. Correlation with endoscopy may be helpful for further evaluation. 2. Circumferential bladder wall thickening and prominent mucosal hyperemia suggesting developing infectious or inflammatory cystitis, with suggestion of a possible indirect colovesicular fistula by way of the abscess cavity; urinalysis and urine culture may help confirm. 3. Continued resolution of the intra-abdominal abscesses with Residual intra-abdominal abscess cavities measuring 1.7 x 2.5 cm and 12.5 x 2.4 cm, the latter containing a punctate focus of gas. No residual drainable fluid component identified.  4. Interval development of mild ascites. 5. Small hiatal hernia. Electronically signed by: Dorethia Molt MD MD 10/28/2024 06:31 PM EST RP Workstation: [id number]    Microbiology: Results for orders placed or performed during the hospital encounter of 10/28/24  Blood culture (routine x 2)     Status: None   Collection  Time: 10/28/24  4:01 PM   Specimen: BLOOD  Result Value Ref Range Status   Specimen Description   Final    BLOOD BLOOD RIGHT HAND Performed at [facility], [address] [place], [place] [address]    Special Requests   Final    BOTTLES DRAWN AEROBIC AND ANAEROBIC Blood Culture results may not be optimal due to an inadequate volume of blood received in culture bottles Performed at [facility], [address] [place], [place] [address]    Culture   Final    NO GROWTH 5 DAYS Performed at [facility], [address], [place], [place] [address]    Report Status 11/02/2024 FINAL  Final  Blood culture (routine x 2)     Status: None   Collection Time: 10/29/24  5:31 AM   Specimen: BLOOD LEFT ARM  Result Value Ref Range Status   Specimen Description   Final    BLOOD LEFT ARM Performed at [facility], [address], [place], [place] [address]    Special Requests   Final    BOTTLES DRAWN AEROBIC AND ANAEROBIC Blood Culture adequate volume Performed at [facility], [address] [place], [place] [address]    Culture   Final    NO GROWTH 5 DAYS Performed at [facility], [address] [place], [place] [address]    Report Status 11/03/2024 FINAL  Final  Urine Culture     Status: None   Collection Time: 10/29/24  9:41 AM   Specimen: Urine, Random  Result Value Ref Range Status   Specimen Description   Final    URINE, RANDOM Performed at [facility], [address], [place], [place] [address]    Special Requests   Final    NONE Reflexed from [id number] Performed at [facility], [address], [place], [place] [address]    Culture   Final    NO GROWTH Performed at [facility], [address] [place], [place] [address]    Report Status 10/30/2024 FINAL  Final    Labs: CBC: Recent Labs  Lab 10/28/24 1226 10/29/24 0531 10/30/24 0557 10/31/24 0602  11/01/24 0622 11/01/24 0827 11/02/24 0615 11/03/24 0543  WBC 12.6* 10.9*   < > 14.8* 12.2* 14.4* 12.6* 10.9*  NEUTROABS 9.8* 7.9*  --   --   --   --   --   --   HGB 13.7 12.0   < > 10.3* 9.3* 10.0* 8.5* 8.9*  HCT 38.1 33.5*   < > 29.8* 27.2* 29.2* 24.7* 26.1*  MCV 85.8 85.9   < > 89.0 89.2 89.6 88.8 90.0  PLT 460* 394   < > 437* 420* 437* 397 426*   < > = values in this interval not displayed.   Basic Metabolic Panel: Recent Labs  Lab 10/29/24 0947 10/29/24 1704 10/30/24 0557 10/30/24 1918 10/31/24 0602 10/31/24 1736 11/01/24 0622 11/01/24 1753 11/02/24 0615  NA 125* 126* 127* 128* 131* 131* 132* 130* 131*  K 2.8* 3.2* 3.5 3.8 4.1 4.0 4.0 4.7 3.8  CL 90* 92*  91* 94* 95* 96* 98 96* 97*  CO2 22 21* 19* 18* 21* 24 27 28 29   GLUCOSE 85 72 74 118* 114* 234* 94 135* 93  BUN 6* 7* 9 10 13 19 17 19 16   CREATININE 0.64 0.59 0.76 0.77 0.88 1.08* 0.74 0.66 0.55  CALCIUM  8.4* 8.2* 8.6* 7.8* 8.3* 8.2* 8.0* 7.8* 7.8*  MG 7.0* 1.9 2.0 1.9 2.1  --   --   --   --   PHOS 3.3  --   --   --   --   --   --   --   --    Liver Function Tests: Recent Labs  Lab 10/28/24 1226 10/29/24 0531  AST 24 17  ALT 16 10  ALKPHOS 78 63  BILITOT 0.5 0.4  PROT 6.9 5.5*  ALBUMIN  4.0 3.1*   CBG: No results for input(s): GLUCAP in the last 168 hours.  Discharge time spent: {LESS THAN/GREATER UYJW:73611} 30 minutes.  Signed: Concepcion Riser, MD [facility] 11/04/2024 "

## 2024-11-04 NOTE — TOC Transition Note (Signed)
 Transition of Care [facility]) - Discharge Note   Patient Details  Name: Michelle Levy MRN: [id number] Date of Birth: Jun 12, 1956  Transition of Care [facility]) CM/SW Contact:  Sonda Manuella Quill, RN Phone Number: 11/04/2024, 12:19 PM   Clinical Narrative:    [place] w/ pt in room; RW has been delivered to room by [employer]; pt selected HH services from [facility]; [place] at agency notified; no IP CM needs.    Final next level of care: Home w Home Health Services Barriers to Discharge: No Barriers Identified   Patient Goals and CMS Choice Patient states their goals for this hospitalization and ongoing recovery are:: home CMS Medicare.gov Compare Post Acute Care list provided to:: Patient   [facility] ownership interest in [facility].provided to:: Patient    Discharge Placement                       Discharge Plan and Services Additional resources added to the After Visit Summary for     Discharge Planning Services: CM Consult            DME Arranged: Vannie rolling DME Agency: [employer] Date DME Agency Contacted: 11/04/24 Time DME Agency Contacted: [phone] Representative spoke with at DME Agency: [place] HH Arranged: RN, PT [facility] Agency: [facility] Date HH Agency Contacted: 11/04/24 Time HH Agency Contacted: 1219 Representative spoke with at [facility] Agency: [facility]  Social Drivers of Health (SDOH) Interventions SDOH Screenings   Food Insecurity: No Food Insecurity (11/03/2024)  Housing: Low Risk (11/03/2024)  Transportation Needs: No Transportation Needs (11/03/2024)  Utilities: Not At Risk (11/03/2024)  Alcohol Screen: Low Risk (09/07/2023)  Depression (PHQ2-9): Low Risk (05/09/2024)  Social Connections: Moderately Integrated (10/29/2024)  Tobacco Use: Low Risk (10/30/2024)     Readmission Risk Interventions    11/03/2024    6:11 PM 10/29/2024    9:27 AM 10/05/2024   10:28 AM  Readmission Risk Prevention Plan  Post Dischage  Appt  Complete Complete  Medication Screening  Complete Complete  Transportation Screening Complete Complete Complete  PCP or Specialist Appt within 5-7 Days Complete    Home Care Screening Complete    Medication Review (RN CM) Complete

## 2024-11-04 NOTE — TOC Progression Note (Addendum)
 Transition of Care [facility]) - Progression Note    Patient Details  Name: Michelle Levy MRN: [id number] Date of Birth: Apr 06, 1956  [facility] CM/SW Contact  Sonda Manuella Quill, RN Phone Number: 11/04/2024, 11:41 AM  Clinical Narrative:    Referral for RW accepted by [facility]; referral for HHPT accepted by:  Home Medical Care  Service Provider Request Status  [facility])  Accepted  [facility] [place] [facility])  Accepted   Pt given list of accepting agencies; awaiting choice.   Expected Discharge Plan: Home w Home Health Services Barriers to Discharge: Continued Medical Work up               Expected Discharge Plan and Services   Discharge Planning Services: CM Consult     Expected Discharge Date: 11/04/24                                     Social Drivers of Health (SDOH) Interventions SDOH Screenings   Food Insecurity: No Food Insecurity (11/03/2024)  Housing: Low Risk (11/03/2024)  Transportation Needs: No Transportation Needs (11/03/2024)  Utilities: Not At Risk (11/03/2024)  Alcohol Screen: Low Risk (09/07/2023)  Depression (PHQ2-9): Low Risk (05/09/2024)  Social Connections: Moderately Integrated (10/29/2024)  Tobacco Use: Low Risk (10/30/2024)    Readmission Risk Interventions    11/03/2024    6:11 PM 10/29/2024    9:27 AM 10/05/2024   10:28 AM  Readmission Risk Prevention Plan  Post Dischage Appt  Complete Complete  Medication Screening  Complete Complete  Transportation Screening Complete Complete Complete  PCP or Specialist Appt within 5-7 Days Complete    Home Care Screening Complete    Medication Review (RN CM) Complete

## 2024-11-04 NOTE — Plan of Care (Signed)
" °  Problem: Education: Goal: Knowledge of General Education information will improve Description: Including pain rating scale, medication(s)/side effects and non-pharmacologic comfort measures Outcome: Progressing   Problem: Health Behavior/Discharge Planning: Goal: Ability to manage health-related needs will improve Outcome: Progressing   Problem: Clinical Measurements: Goal: Ability to maintain clinical measurements within normal limits will improve Outcome: Progressing Goal: Will remain free from infection Outcome: Progressing Goal: Diagnostic test results will improve Outcome: Progressing Goal: Respiratory complications will improve Outcome: Progressing Goal: Cardiovascular complication will be avoided Outcome: Progressing   Problem: Activity: Goal: Risk for activity intolerance will decrease Outcome: Progressing   Problem: Nutrition: Goal: Adequate nutrition will be maintained Outcome: Progressing   Problem: Coping: Goal: Level of anxiety will decrease Outcome: Progressing   Problem: Elimination: Goal: Will not experience complications related to bowel motility Outcome: Progressing Goal: Will not experience complications related to urinary retention Outcome: Progressing   Problem: Pain Managment: Goal: General experience of comfort will improve and/or be controlled Outcome: Progressing   Problem: Safety: Goal: Ability to remain free from injury will improve Outcome: Progressing   Problem: Skin Integrity: Goal: Risk for impaired skin integrity will decrease Outcome: Progressing   Problem: Education: Goal: Understanding of discharge needs will improve Outcome: Progressing Goal: Verbalization of understanding of the causes of altered bowel function will improve Outcome: Progressing   Problem: Activity: Goal: Ability to tolerate increased activity will improve Outcome: Progressing   Problem: Bowel/Gastric: Goal: Gastrointestinal status for postoperative  course will improve Outcome: Progressing   Problem: Health Behavior/Discharge Planning: Goal: Identification of community resources to assist with postoperative recovery needs will improve Outcome: Progressing   Problem: Nutritional: Goal: Will attain and maintain optimal nutritional status will improve Outcome: Progressing   Problem: Clinical Measurements: Goal: Postoperative complications will be avoided or minimized Outcome: Progressing   Problem: Respiratory: Goal: Respiratory status will improve Outcome: Progressing   Problem: Skin Integrity: Goal: Will show signs of wound healing Outcome: Progressing   Problem: Education: Goal: Knowledge of the prescribed therapeutic regimen will improve Outcome: Progressing   Problem: Bowel/Gastric: Goal: Gastrointestinal status for postoperative course will improve Outcome: Progressing   Problem: Cardiac: Goal: Ability to maintain an adequate cardiac output Outcome: Progressing Goal: Will show no evidence of cardiac arrhythmias Outcome: Progressing   Problem: Nutritional: Goal: Will attain and maintain optimal nutritional status Outcome: Progressing   Problem: Neurological: Goal: Will regain or maintain usual level of consciousness Outcome: Progressing   Problem: Clinical Measurements: Goal: Ability to maintain clinical measurements within normal limits Outcome: Progressing Goal: Postoperative complications will be avoided or minimized Outcome: Progressing   Problem: Respiratory: Goal: Will regain and/or maintain adequate ventilation Outcome: Progressing Goal: Respiratory status will improve Outcome: Progressing   Problem: Skin Integrity: Goal: Demonstrates signs of wound healing without infection Outcome: Progressing   Problem: Urinary Elimination: Goal: Will remain free from infection Outcome: Progressing Goal: Ability to achieve and maintain adequate urine output Outcome: Progressing   "

## 2024-11-04 NOTE — Progress Notes (Addendum)
 e1/18/2026  Michelle Levy [id number] [birth year]  CARE TEAM: PCP: Aisha Harvey, MD  Outpatient Care Team: Patient Care Team: Aisha Harvey, MD as PCP - General (Family Medicine)  Inpatient Treatment Team: Treatment Team:  Darci Pore, MD Ccs, Md, MD Sherree Stephane KIDD, RN Bobbette Mattocks, MD Ringler, [name] CROME, RN Zuniga Jacobo, Nicole I, [place]   Problem List:   Principal Problem:   Bowel obstruction (HCC) Active Problems:   Bipolar disorder, curr episode mixed, severe, w/o psychotic features (HCC)   Abscess of sigmoid colon due to diverticulitis   Hyponatremia   Hypokalemia   Hypermagnesemia   Colovesical fistula   10/30/2024  Procedures: CYSTOSCOPY WITH INDOCYANINE GREEN  IMAGING (ICG) DIAGNOSTIC LAPAROSCOPY, OMENTOPEXY OF BLADDER FISTULA, AND SEROSAL REPAIR OF CECUM HARTMANN PROCEDURE    Assessment [facility] Stay = 7 days) 5 Days Post-Op     Overall patient is recovering relatively well.  From a surgical perspective that patient is stable to discharge home. She is anxious but is agreeable to discharge today.        Plan:   -  Discharge home pending primary service approval  - Continue with regular diet  - Initiate treatment with daily fiber supplement  - Continue with wet to try dressing once a day.  - Continue Antibiotics X 3 days  - Okay to D/C Telemetry from a surgical perspective.  Telemetry is still on.  - Monitor electrolytes & replace as needed  - Hyponatremia resolving continue to monitor Keep K>4, Mg>2, Phos>3  -VTE prophylaxis- SCDs.  Anticoagulation prophyllaxis SQ as appropriate until discharge.  - Mobilize as tolerated to help recovery.  Enlist therapies in moderate/high risk patients as appropriate  - Pain Management  - Continue PRN pain medication to optimize recovery.   I updated the patient's status to the patient  Recommendations were made.  Questions were answered.  She expressed understanding &  appreciation.  -Disposition:  Disposition:  The patient is from: Home Anticipate discharge to:  Home Anticipated Date of Discharge is:  11/05/2024 Barriers to discharge:  Patient uncomfortable leaving today.  Patient currently is medically stable for discharge from the hospital from a surgery standpoint. Patient plans to have Ex -husband assist with at home recovery and home health.      I reviewed last 24 h vitals and pain scores, last 48 h intake and output, last 24 h labs and trends, and last 24 h imaging results.  I have reviewed this patient's available data, including medical history, events of note, test results, etc as part of my evaluation.   A significant portion of that time was spent in counseling. Care during the described time interval was provided by me.  This care required moderate level of medical decision making.  11/04/2024    Subjective: (Chief complaint)  Patient stated she is overall feeling well. Pain is well controlled with PRN pain meds.   She is tolerating a regular diet and oral fluids.  She denies nausea, vomiting, difficulties urinating, or blood in urine.  BM and flatus via ostomy bag.  She ambulates several times a day to the hall and chair.  She is comfortable discharging to home with assistance from home health and friends. We reviewed the option for temporary nursing facility placement to aid in recovering which she declines.    Objective:  Vital signs:  Vitals:   11/03/24 0332 11/03/24 1318 11/03/24 2019 11/04/24 0344  BP: 113/66 (!) 117/57 108/60 112/64  Pulse: 79 78 77 79  Resp:  18 16 20 15   Temp: 98.9 F (37.2 C) 98.5 F (36.9 C) 98.1 F (36.7 C) 99 F (37.2 C)  TempSrc: Oral Oral Oral Oral  SpO2: 98%  100% 97%  Weight:      Height:        Last BM Date : 11/02/24  Intake/Output   Yesterday:  01/17 0701 - 01/18 0700 In: 142.2 [IV Piggyback:142.2] Out: 300 [Stool:300] This shift:  No intake/output data  recorded.  Bowel function:  Flatus: YES  BM:  YES  Drain: No   Physical Exam:  General: Pt awake/alert in no acute distress Eyes: PERRL, normal EOM.  Sclera clear.  No icterus Neuro: CN II-XII intact w/o focal sensory/motor deficits. Lymph: No head/neck/groin lymphadenopathy Psych:  No delerium/psychosis/paranoia.  Oriented x 4 HENT: Normocephalic, Mucus membranes moist.  No thrush Neck: Supple, No tracheal deviation.  No obvious thyromegaly Chest: No pain to chest wall compression.  Good respiratory excursion.  No audible wheezing CV:  Pulses intact.  Regular rhythm.  No major extremity edema MS: Normal AROM mjr joints.  No obvious deformity  Abdomen: Soft. Non distended and tender at the incision sites only.  No evidence of peritonitis.  No incarcerated hernias.  Ext:   No deformity.  No mjr edema.  No cyanosis Skin: No petechiae / purpurea.  No major sores.  Warm and dry    Results:   Cultures: Recent Results (from the past 720 hours)  Blood culture (routine x 2)     Status: None   Collection Time: 10/28/24  4:01 PM   Specimen: BLOOD  Result Value Ref Range Status   Specimen Description   Final    BLOOD BLOOD RIGHT HAND Performed at [facility], [address] [place], [place] [address]    Special Requests   Final    BOTTLES DRAWN AEROBIC AND ANAEROBIC Blood Culture results may not be optimal due to an inadequate volume of blood received in culture bottles Performed at [facility], [address], [place], [place] [address]    Culture   Final    NO GROWTH 5 DAYS Performed at [facility], [address] [place], [place] [address]    Report Status 11/02/2024 FINAL  Final  Blood culture (routine x 2)     Status: None   Collection Time: 10/29/24  5:31 AM   Specimen: BLOOD LEFT ARM  Result Value Ref Range Status   Specimen Description   Final    BLOOD LEFT ARM Performed at [facility], [address],  [place], [place] [address]    Special Requests   Final    BOTTLES DRAWN AEROBIC AND ANAEROBIC Blood Culture adequate volume Performed at [facility], [address], [place], [place] [address]    Culture   Final    NO GROWTH 5 DAYS Performed at [facility], [address], [place], [place] [address]    Report Status 11/03/2024 FINAL  Final  Urine Culture     Status: None   Collection Time: 10/29/24  9:41 AM   Specimen: Urine, Random  Result Value Ref Range Status   Specimen Description   Final    URINE, RANDOM Performed at [facility], [address], [place], [place] [address]    Special Requests   Final    NONE Reflexed from [id number] Performed at [facility], [address] [place], [place] [address]  Culture   Final    NO GROWTH Performed at [facility], [address], [place], [place] [address]    Report Status 10/30/2024 FINAL  Final    Labs: Results for orders placed or performed during the hospital encounter of 10/28/24 (from the past 48 hours)  CBC     Status: Abnormal   Collection Time: 11/03/24  5:43 AM  Result Value Ref Range   WBC 10.9 (H) 4.0 - 10.5 K/uL   RBC 2.90 (L) 3.87 - 5.11 MIL/uL   Hemoglobin 8.9 (L) 12.0 - 15.0 g/dL   HCT 73.8 (L) 63.9 - 53.9 %   MCV 90.0 80.0 - 100.0 fL   MCH 30.7 26.0 - 34.0 pg   MCHC 34.1 30.0 - 36.0 g/dL   RDW 85.4 88.4 - 84.4 %   Platelets 426 (H) 150 - 400 K/uL   nRBC 0.0 0.0 - 0.2 %    Comment: Performed at [facility], [address] [place], [place] [address]    Imaging / Studies: No results found.  Medications / Allergies: per chart  Antibiotics: Anti-infectives (From admission, onward)    Start     Dose/Rate Route Frequency Ordered Stop   10/30/24 1600  piperacillin -tazobactam (ZOSYN ) IVPB 3.375 g        3.375 g 12.5 mL/hr over 240 Minutes Intravenous Every 8 hours 10/30/24 1230 11/04/24 1559   10/30/24 0600  cefoTEtan   (CEFOTAN ) 2 g in sodium chloride  0.9 % 100 mL IVPB        2 g 200 mL/hr over 30 Minutes Intravenous On call to O.R. 10/29/24 1219 10/30/24 0817   10/29/24 1400  neomycin  (MYCIFRADIN ) tablet 1,000 mg       Placed in And Linked Group   1,000 mg Per Tube 3 times per day 10/29/24 1219 10/29/24 2211   10/29/24 1400  metroNIDAZOLE  (FLAGYL ) tablet 1,000 mg       Placed in And Linked Group   1,000 mg Per Tube 3 times per day 10/29/24 1219 10/29/24 2211   10/29/24 0000  piperacillin -tazobactam (ZOSYN ) IVPB 3.375 g  Status:  Discontinued        3.375 g 12.5 mL/hr over 240 Minutes Intravenous Every 8 hours 10/28/24 2146 10/30/24 1230   10/28/24 1630  piperacillin -tazobactam (ZOSYN ) IVPB 3.375 g        3.375 g 100 mL/hr over 30 Minutes Intravenous  Once 10/28/24 1624 10/28/24 1753   10/28/24 1600  cefTRIAXone  (ROCEPHIN ) 1 g in sodium chloride  0.9 % 100 mL IVPB  Status:  Discontinued        1 g 200 mL/hr over 30 Minutes Intravenous  Once 10/28/24 1550 10/28/24 1611         Note: Portions of this report may have been transcribed using voice recognition software. Every effort was made to ensure accuracy; however, inadvertent computerized transcription errors may be present.   Any transcriptional errors that result from this process are unintentional.   Lauraine Ronnald Nick PA - Student [facility]  11/04/2024  7:25 AM

## 2024-11-09 ENCOUNTER — Ambulatory Visit (HOSPITAL_COMMUNITY)
Admission: RE | Admit: 2024-11-09 | Discharge: 2024-11-09 | Disposition: A | Source: Ambulatory Visit | Attending: *Deleted | Admitting: *Deleted

## 2024-11-09 ENCOUNTER — Ambulatory Visit (HOSPITAL_COMMUNITY): Admitting: Nurse Practitioner

## 2024-11-09 DIAGNOSIS — L24B3 Irritant contact dermatitis related to fecal or urinary stoma or fistula: Secondary | ICD-10-CM | POA: Insufficient documentation

## 2024-11-09 DIAGNOSIS — Z433 Encounter for attention to colostomy: Secondary | ICD-10-CM | POA: Insufficient documentation

## 2024-11-09 DIAGNOSIS — Z556 Problems related to health literacy: Secondary | ICD-10-CM | POA: Diagnosis not present

## 2024-11-09 NOTE — Discharge Instructions (Signed)
 REcommend convex pouch, 1 piece or 2 Clean skin around stoma, pat dry.  Apply stoma powder and skin prep for irritated skin Apply barrier ring Cut barrier opening  1 1/4 Apply barrier and pouch Empty when 1/3 full   Wound Care: Cleanse with normal saline pat dry Apply strip of aquacel to wound bed, cover with gauze and tape Change twice weekly, with ostomy pouch

## 2024-11-09 NOTE — Progress Notes (Signed)
 [facility]   Reason for visit:  LLQ colostomy.  Midline surgical wound..   HH admission nurse has been out but no care was performed and they are unsure when the nursing care will start.  Impending winter storm may make travel dangerous.  Patient states she is squeamish and is not able to perform wound care or pouch change at this time. We will perform pouch change together and I explain that [facility] will be coming out to teach patient or family how to perform wound and ostomy care.  Patient states her ex husband has been helping some.   HPI:  Large bowel obstruction with colovesical fistula, end colostomy Past Medical History:  Diagnosis Date   Alcohol use disorder, severe, dependence (HCC) 11/24/2015   Bereavement 11/24/2015   Bipolar affective disorder, current episode manic (HCC) 09/07/2023   Diverticulitis    Family History  Problem Relation Age of Onset   Alcohol abuse Mother    Bipolar disorder Mother    Alcohol abuse Father    Depression Sister    Bipolar disorder Sister    Breast cancer Neg Hx    Allergies[1] Current Outpatient Medications  Medication Sig Dispense Refill Last Dose/Taking   B Complex-C (B-COMPLEX WITH VITAMIN C) tablet Take 1 tablet by mouth daily.      Calcium  Carbonate Antacid (CALCIUM  CARBONATE PO) Take 250 mg by mouth 2 (two) times daily.      Cholecalciferol  (VITAMIN D3) 1000 units CAPS Take 1,000 Units by mouth daily.      Cranberry 250 MG CAPS Take 250 mg by mouth daily.      divalproex  (DEPAKOTE  ER) 250 MG 24 hr tablet Take 3 tablets (750 mg total) by mouth at bedtime. 90 tablet 0    Menaquinone-7 (FT VITAMIN K2 PO) Take 100 mcg by mouth daily.      methocarbamol  (ROBAXIN ) 500 MG tablet Take 1 tablet (500 mg total) by mouth every 8 (eight) hours as needed for up to 10 days for muscle spasms. 30 tablet 0    Milk Thistle 300 MG CAPS Take 300 mg by mouth daily.      Multiple Vitamin (MULTIVITAMIN PO) Take 1 tablet by mouth daily.      Omega  3-6-9 Fatty Acids (TRIPLE OMEGA COMPLEX) CPDR Take 1 capsule by mouth daily.      oxyCODONE  (OXY IR/ROXICODONE ) 5 MG immediate release tablet Take 1 tablet (5 mg total) by mouth every 6 (six) hours as needed for moderate pain (pain score 4-6), severe pain (pain score 7-10) or breakthrough pain (5mg  for moderate pain, 10mg  for severe pain). 20 tablet 0    No current facility-administered medications for this encounter.   ROS  Review of Systems  Constitutional:  Positive for fatigue.  Respiratory: Negative.    Cardiovascular: Negative.   Gastrointestinal:        Colovesical fistula repair Diverticulitis colostomy  Genitourinary:        Bladder repair  Skin:  Positive for color change.       Peristomal skin irritation Midline surgical wound   Psychiatric/Behavioral:  Positive for agitation. The patient is nervous/anxious.        Irritation at having to wait past appointment time.  States she is squeamish and unable to perform ostomy and wound care  All other systems reviewed and are negative.  Vital signs:  BP 103/63 (BP Location: Right Arm)   Pulse 98   Temp 98 F (36.7 C) (Oral)   Resp ROLLEN)  22   SpO2 97%  Exam:  Physical Exam Vitals reviewed.  Constitutional:      Appearance: Normal appearance.  HENT:     Mouth/Throat:     Mouth: Mucous membranes are moist.  Cardiovascular:     Rate and Rhythm: Normal rate.  Pulmonary:     Effort: Pulmonary effort is normal.  Abdominal:     Palpations: Abdomen is soft.  Musculoskeletal:        General: Normal range of motion.  Skin:    General: Skin is warm and dry.  Neurological:     Mental Status: She is alert and oriented to person, place, and time. Mental status is at baseline.     Stoma type/location:  LLQ colostomy Stomal assessment/size:  1 1/4 red and moist, some sloughing mucosa reveals red moist tissue Peristomal assessment:  stoma dips on lower half, peristomal skin red and tender from 4 to 7 o'clock.  Midline incision  measures 16 cm x 1 cm x 0.3 cm  is healing nicely.  Dressing has been in place since discharge on 11/04/24, there is green drainage present, musty odor but not pungent. Vital signs stable.  I explain that NS moist gauze must be changed daily.  I demonstrate NS moist gauze, but patient will not be changing this independently until [facility] has worked with her more.   She is unclear when her HH will actually start, so I place a silver hydrofiber dressing in the wound bed that will kill bacteria for up to 7 days.  I explain that next pouch change will need to be done by Wednesday and wound care should be done by that time as well.  I have provided additional aquacel in case HH not available.   Treatment options for stomal/peristomal skin: stoma powder and skin prep barrier ring and convex pouch system.   Output: soft brown stool  Ostomy pouching: 1pc. Recommended but has some samples of 2 piece.  I discuss that either system is ok, she can use the supplies has as needed.  She likes one piece and we will demonstrate that today.  Education provided:  We remove old pouch and cleanse skin.  We discuss showering with or without pouch and without wound bandage once she feels comfortable with wound care.  FOr now, she is sponge bathing only.    Impression/dx  Colostomy Irritant dermatitis Wound care, knowledge deficit Discussion  See above Wound care Ostomy care  Plan  Supplies provided for wound and ostomy care.  Will enroll with [facility] I personally spent a total of 55 minutes in the care of the patient today including preparing to see the patient, getting/reviewing separately obtained history, performing a medically appropriate exam/evaluation, counseling and educating, placing orders, referring and communicating with other health care professionals, documenting clinical information in the EHR, independently interpreting results, communicating results, coordinating care, and pouch change, ostomy education and  wound care.  Provided supplies. .     Visit time: 55 minutes.   Darice Cooley FNP-BC        [1] No Known Allergies

## 2024-11-10 DIAGNOSIS — T8189XA Other complications of procedures, not elsewhere classified, initial encounter: Secondary | ICD-10-CM | POA: Insufficient documentation

## 2024-11-10 DIAGNOSIS — L24B3 Irritant contact dermatitis related to fecal or urinary stoma or fistula: Secondary | ICD-10-CM | POA: Insufficient documentation

## 2024-11-10 DIAGNOSIS — Z933 Colostomy status: Secondary | ICD-10-CM | POA: Insufficient documentation

## 2024-11-13 ENCOUNTER — Other Ambulatory Visit (HOSPITAL_COMMUNITY): Payer: Self-pay | Admitting: Nurse Practitioner

## 2024-11-13 DIAGNOSIS — Z433 Encounter for attention to colostomy: Secondary | ICD-10-CM

## 2024-11-13 DIAGNOSIS — T8189XA Other complications of procedures, not elsewhere classified, initial encounter: Secondary | ICD-10-CM

## 2024-11-13 DIAGNOSIS — L24B3 Irritant contact dermatitis related to fecal or urinary stoma or fistula: Secondary | ICD-10-CM

## 2024-11-22 ENCOUNTER — Ambulatory Visit (HOSPITAL_COMMUNITY): Admitting: Licensed Clinical Social Worker

## 2024-11-22 DIAGNOSIS — F3173 Bipolar disorder, in partial remission, most recent episode manic: Secondary | ICD-10-CM

## 2024-11-22 DIAGNOSIS — F411 Generalized anxiety disorder: Secondary | ICD-10-CM

## 2024-11-22 NOTE — Progress Notes (Signed)
 Virtual Visit via Video Note   I connected with Michelle Levy on 11/22/24 at 2:00pm by video enabled telemedicine application and verified that I am speaking with the correct person using two identifiers.   I discussed the limitations, risks, security and privacy concerns of performing an evaluation and management service by video and the availability of in person appointments. I also discussed with the patient that there may be a patient responsible charge related to this service. The patient expressed understanding and agreed to proceed.   I discussed the assessment and treatment plan with the patient. The patient was provided an opportunity to ask questions and all were answered. The patient agreed with the plan and demonstrated an understanding of the instructions.   The patient was advised to call back or seek an in-person evaluation if the symptoms worsen or if the condition fails to improve as anticipated.   I provided 35 minutes of non-face-to-face time during this encounter.     Darleene Ricker, LCSW, LCAS ____________________________   THERAPIST PROGRESS NOTE   Session Time: 2:00pm - 2:35pm          Location: Patient: Patient home Provider: [facility]   Participation Level: Active    Behavioral Response: Alert, casually dressed, anxious mood/affect   Type of Therapy:  Individual Therapy   Treatment Goals addressed: Mood management; Psychiatry followup; Medication management; Maintaining exercise and diet; Maintaining work schedule; Improving self-esteem; Attending church weekly; Following safety plan    Progress Towards Goals: Achieved    Interventions: CBT, discharge planning, PHQ9 and GAD7 screenings    Summary: Michelle Levy. Michelle Levy is a 69 year old divorced Caucasian female that presented today with diagnoses of Bipolar I Disorder, in partial remission, most recent episode manic; and Generalized Anxiety Disorder.          Suicidal/Homicidal: None; without intent or plan.      Therapist Response:  Clinician met with Michelle Levy today for virtual therapy session and assessed for safety, medication compliance, and sobriety.  Michelle Levy presented for appointment on time and was alert, oriented x5, with no evidence or self-report of active SI/HI or A/V H.  [name] denied any abuse of alcohol or illicit substances. Michelle Levy reported compliance with all medication.  Clinician inquired about Michelle Levy's emotional ratings today, as well as any significant changes in thoughts, feelings or behavior since last check-in.  Michelle Levy reported scores of 2/10 for depression, 3/10 for anxiety, and 0/10 for anger/irritability.  Michelle Levy denied any recent panic attacks or outbursts.  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 Michelle Levy reported that despite these issues, she has been coping well, and didn't have any issues to address for today's session.  Clinician recommended revisiting treatment plan today with Michelle Levy to monitor progress and determine whether goals have been met for treatment.  Michelle Levy was agreeable to this, so clinician reviewed current treatment plan as follows:  Meet with clinician biweekly for therapy sessions to update on goal progress and address any needs that arise; Follow up with psychiatrist every 2-3x months regarding efficacy of medication and any dose modification necessary; Take medications daily as prescribed to aid in symptom reduction and improvement of daily functioning; Maintain average daily anxiety level at 0-1/10 for the next 90 days by practicing relaxation techniques with proven efficacy 2-3x daily, in addition to challenging anxious thoughts that arise to negate negative impact on  outlook; Exercise for at least 1 hour, x1 per week in addition to following healthy diet to improve both physical and mental well-being per PCP recommendations;  Attend church 1x per week to stay productive, engaged with supportive community, and maintain spiritual support, in addition to any other available activities such as fellowship meetings x1 per month; Maintain healthier boundaries with all supports to avoid worsening anxiety and depression, as well as enforce assertive communication skills, with goal of limiting contact with toxic supports until more respectful communication patterns are established; Implement 4-5x sleep hygiene techniques in order to maintain average nightly rest at 8 hours and reduce related fatigue and irritability upon waking over next 90 days; Increase average self-esteem from low point of 8/10 up to a 10/10 within next 90 days by engaging in daily recitation of positive self-talk and challenging negative thinking that arises; 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  Progress is evidenced by Michelle Levy reducing frequency of therapy from biweekly down to monthly visits, staying consistent with her psychiatrist and taking medication as prescribed.  Michelle Levy reported that she has exceeded her exercise goal due to starting physical therapy after her colostomy procedure.  She reported that although she cannot go to church in person right now, she is doing chartered loss adjuster work for them, which is meaningful to her.  Michelle Levy reported that she is also managing boundaries in a healthy manner to avoid interpersonal stress, and noted that her support system has been making an effort to help her more. Michelle Levy reported that she has not been working as much due to recent medical procedure, but she is looking forward to getting back into a routine once she fully recovers.  Michelle Levy reported that sleep can still be disrupted by bathroom  urgency, but she plans to seek a urologist consult to help.  She reported that anxiety had been stable up until snow and ice impacted her business, but she feels like this will improve as weather does too.  Shanaya reported that self-esteem has remained at an 8/10 which she feels is reasonable.  Yexalen reported that she has and will continue to follow her safety plan, with no issues regarding SI/HI.  Clinician utilized PHQ9 and GAD7 screenings with Mattie today to determine current depression and anxiety symptoms.  Esra participated in screenings, with scores of 0 for both.  Clinician and Deneka were in agreement regarding discharge today based upon progress toward goals and ongoing stability.  She was informed that if she can return for services in the future if needed.  Judaea stated I think I'm handling things just fine.     Plan: Sheyna will be discharged from clinician care.  She reported that she will continue taking medication as prescribed and meet with psychiatrist x1 every 2-3 months.      Diagnosis: Bipolar I Disorder, in partial remission, most recent episode manic; and Generalized Anxiety Disorder.   Collaboration of Care:   No collaboration of care required for this visit.  Patient/Guardian was advised Release of Information must be obtained prior to any record release in order to collaborate their care with an outside provider. Patient/Guardian was advised if they have not already done so to contact the registration department to sign all necessary forms in order for us  to release information regarding their care.    Consent: Patient/Guardian gives verbal consent for treatment and assignment of benefits for services provided during this visit. Patient/Guardian expressed understanding and agreed to proceed.   Darleene Ricker, LCSW, LCAS 11/22/24

## 2024-11-23 ENCOUNTER — Telehealth (HOSPITAL_COMMUNITY): Payer: Self-pay | Admitting: Licensed Clinical Social Worker

## 2024-11-23 ENCOUNTER — Ambulatory Visit (HOSPITAL_COMMUNITY): Admitting: Nurse Practitioner

## 2024-11-23 NOTE — Telephone Encounter (Signed)
 Per provider, client agreed upon discharge based upon achieving treatment goals.
